# Patient Record
Sex: Male | Born: 1945
Health system: Southern US, Community
[De-identification: ages and names within clinical notes are randomized; demographics above are authoritative.]

## PROBLEM LIST (undated history)

## (undated) DIAGNOSIS — I10 Essential (primary) hypertension: Secondary | ICD-10-CM

## (undated) DIAGNOSIS — G4730 Sleep apnea, unspecified: Secondary | ICD-10-CM

## (undated) DIAGNOSIS — K250 Acute gastric ulcer with hemorrhage: Secondary | ICD-10-CM

## (undated) HISTORY — PX: KNEE ARTHROSCOPY: SUR90

## (undated) HISTORY — PX: COLONOSCOPY: SHX174

## (undated) HISTORY — DX: Essential (primary) hypertension: I10

---

## 1969-10-05 HISTORY — PX: OTHER SURGICAL HISTORY: SHX169

## 1982-10-05 HISTORY — PX: HAMMER TOE SURGERY: SHX385

## 2007-04-05 ENCOUNTER — Ambulatory Visit: Payer: Self-pay | Admitting: Internal Medicine

## 2007-05-06 ENCOUNTER — Ambulatory Visit: Payer: Self-pay | Admitting: Gastroenterology

## 2007-05-18 ENCOUNTER — Ambulatory Visit: Payer: BC Managed Care – PPO | Admitting: Gastroenterology

## 2007-05-18 ENCOUNTER — Encounter: Payer: Self-pay | Admitting: Gastroenterology

## 2008-08-20 ENCOUNTER — Encounter: Admission: RE | Admit: 2008-08-20 | Discharge: 2008-08-20 | Payer: Self-pay | Admitting: Family Medicine

## 2008-09-13 ENCOUNTER — Encounter: Admission: RE | Admit: 2008-09-13 | Discharge: 2008-09-13 | Payer: Self-pay | Admitting: Family Medicine

## 2010-02-20 ENCOUNTER — Encounter
Admission: RE | Admit: 2010-02-20 | Discharge: 2010-02-20 | Payer: Self-pay | Source: Ambulatory Visit | Admitting: Family Medicine

## 2011-02-17 NOTE — Assessment & Plan Note (Signed)
[facility]                             PULMONARY OFFICE NOTE   BRESLIN, HEMANN                         MRN:          [id number]  DATE:04/05/2007                            DOB:          1946-07-12    PROBLEM:  This 65 year old man referred through the courtesy of Dr.  Bruna Potter with the concern of snoring.   HISTORY OF PRESENT ILLNESS:  He denies daytime sleepiness and is not  aware of apnea's. He sleeps on his back comfortably without nasal  congestion. Typical bedtime is between 11 and midnight, estimated 5 to  10 minutes sleep latency. Sometimes, he will awake once. Sometimes, not  at all, after sleep onset, before getting up at 7 to 8 a.m. He has been  losing weight. He is alone with nobody complaining about his sleep  pattern.   MEDICATIONS:  Homeopathic medication for leg cramps, CO q.10.,  hyaluronic acid.   ALLERGIES:  NO KNOWN DRUG ALLERGIES.   REVIEW OF SYSTEMS:  He admits only to snoring and to a 15 pound weight  loss in the last 2 years. He denies cough or dyspnea, significant nasal  congestion, headache, confusion, chest pain, or palpitations.   PAST MEDICAL HISTORY:  Specifically, has had no ENT surgery. No history  of lung or cardiac disease.   PAST SURGICAL HISTORY:  Repair of hammer toe in both feet and  arthroscopy both knees.   SOCIAL HISTORY:  Smokes 1/2 pack per day. Drinks 12 beers per week. He  is divorced with children. Retired from [employer] and  moved here from [place] to be located closer to friends.   FAMILY HISTORY:  He offers little detail but thinks he is unaware of  anybody with sleep problems, heart or cerebrovascular problems. There  may be some with diabetes.   PHYSICAL EXAMINATION:  VITAL SIGNS:  Weight 234 pounds. Blood pressure  164/110. Pulse regular at 75. Room air saturation 100%.  GENERAL:  He is alert. Medium build.  HEENT:  Palate spacing 2/4. Voice quality normal. No stridor.  No  thyromegaly.  CHEST:  Trace end-expiratory wheeze, unlabored without dullness or  rhonchi.  HEART:  Sounds regular without murmur or gallop.  LYMPH:  No adenopathy found.  ABDOMEN:  No enlargement of liver or spleen.  EXTREMITIES:  No tremor, cyanosis, clubbing, or edema.   IMPRESSION:  1. Snoring without strong symptom pattern suggestion of sleep apnea.  2. Tobacco use.  3. Moderate alcohol use.  4. Hypertension.   PLAN:  We discussed the physiology medical concerns and general pattern  of obstructive sleep apnea. He indicated interest in getting himself a  tape recorder to record his own snoring for his own observation at home.  I gave him a booklet on sleep apnea. He was not interested in moving  forward with a sleep study at this time but understands it is available.  He will return p.r.n. and will followup with Dr. Clide Deutscher for his primary  medical care. I appreciate the chance to meet him.     Clinton D. Maple Hudson, MD,  FCCP, FACP  Electronically Signed    CDY/MedQ  DD: 04/10/2007  DT: 04/10/2007  Job #: [id number]   cc:   Jarrett Soho., M.D.

## 2012-04-26 ENCOUNTER — Other Ambulatory Visit: Payer: Self-pay | Admitting: Family Medicine

## 2012-04-26 ENCOUNTER — Ambulatory Visit
Admission: RE | Admit: 2012-04-26 | Discharge: 2012-04-26 | Disposition: A | Discharge status: Home / Self Care | Payer: BC Managed Care – PPO | Source: Ambulatory Visit | Attending: Family Medicine | Admitting: Family Medicine

## 2012-04-26 DIAGNOSIS — M25473 Effusion, unspecified ankle: Secondary | ICD-10-CM

## 2012-04-26 DIAGNOSIS — M25476 Effusion, unspecified foot: Secondary | ICD-10-CM

## 2012-05-05 ENCOUNTER — Encounter: Payer: Self-pay | Admitting: Gastroenterology

## 2012-05-24 ENCOUNTER — Encounter: Payer: Self-pay | Admitting: Gastroenterology

## 2012-06-22 ENCOUNTER — Ambulatory Visit (AMBULATORY_SURGERY_CENTER): Payer: BC Managed Care – PPO | Admitting: *Deleted

## 2012-06-22 ENCOUNTER — Encounter: Payer: Self-pay | Admitting: Gastroenterology

## 2012-06-22 VITALS — Ht 72.0 in | Wt 236.9 lb

## 2012-06-22 DIAGNOSIS — Z1211 Encounter for screening for malignant neoplasm of colon: Secondary | ICD-10-CM

## 2012-06-22 MED ORDER — NA SULFATE-K SULFATE-MG SULF 17.5-3.13-1.6 GM/177ML PO SOLN: ORAL | Status: DC

## 2012-07-06 ENCOUNTER — Ambulatory Visit (AMBULATORY_SURGERY_CENTER): Payer: BC Managed Care – PPO | Admitting: Gastroenterology

## 2012-07-06 ENCOUNTER — Encounter: Payer: Self-pay | Admitting: Gastroenterology

## 2012-07-06 VITALS — BP 144/82 | HR 66 | Temp 97.6°F | Resp 11 | Ht 72.0 in | Wt 236.0 lb

## 2012-07-06 DIAGNOSIS — Z1211 Encounter for screening for malignant neoplasm of colon: Secondary | ICD-10-CM

## 2012-07-06 DIAGNOSIS — D126 Benign neoplasm of colon, unspecified: Secondary | ICD-10-CM

## 2012-07-06 MED ORDER — SODIUM CHLORIDE 0.9 % IV SOLN: 500.0000 mL | INTRAVENOUS | Status: DC

## 2012-07-06 NOTE — Progress Notes (Signed)
Patient did not experience any of the following events: a burn prior to discharge; a fall within the facility; wrong site/side/patient/procedure/implant event; or a hospital transfer or hospital admission upon discharge from the facility. (G8907) Patient did not have preoperative order for IV antibiotic SSI prophylaxis. (G8918)  

## 2012-07-06 NOTE — Patient Instructions (Signed)
Colon polyp x 1 removed, diverticulosis and hemorrhoids seen. Try to follow high fiber diet. See handouts. Result letter in your mail in 2-3 weeks. Resume current medications. Hold aspirin, NSAIDS, and anti-inflammatory medications for 2 weeks. Call us with any questions or concerns. Thank you!!   YOU HAD AN ENDOSCOPIC PROCEDURE TODAY AT THE [facility]: Refer to the procedure report that was given to you for any specific questions about what was found during the examination.  If the procedure report does not answer your questions, please call your gastroenterologist to clarify.  If you requested that your care partner not be given the details of your procedure findings, then the procedure report has been included in a sealed envelope for you to review at your convenience later.  YOU SHOULD EXPECT: Some feelings of bloating in the abdomen. Passage of more gas than usual.  Walking can help get rid of the air that was put into your GI tract during the procedure and reduce the bloating. If you had a lower endoscopy (such as a colonoscopy or flexible sigmoidoscopy) you may notice spotting of blood in your stool or on the toilet paper. If you underwent a bowel prep for your procedure, then you may not have a normal bowel movement for a few days.  DIET: Your first meal following the procedure should be a light meal and then it is ok to progress to your normal diet.  A half-sandwich or bowl of soup is an example of a good first meal.  Heavy or fried foods are harder to digest and may make you feel nauseous or bloated.  Likewise meals heavy in dairy and vegetables can cause extra gas to form and this can also increase the bloating.  Drink plenty of fluids but you should avoid alcoholic beverages for 24 hours.  ACTIVITY: Your care partner should take you home directly after the procedure.  You should plan to take it easy, moving slowly for the rest of the day.  You can resume normal activity the day  after the procedure however you should NOT DRIVE or use heavy machinery for 24 hours (because of the sedation medicines used during the test).    SYMPTOMS TO REPORT IMMEDIATELY: A gastroenterologist can be reached at any hour.  During normal business hours, 8:30 AM to 5:00 PM Monday through Friday, call [phone].  After hours and on weekends, please call the GI answering service at [phone] who will take a message and have the physician on call contact you.   Following lower endoscopy (colonoscopy or flexible sigmoidoscopy):  Excessive amounts of blood in the stool  Significant tenderness or worsening of abdominal pains  Swelling of the abdomen that is new, acute  Fever of 100F or higher  FOLLOW UP: If any biopsies were taken you will be contacted by phone or by letter within the next 1-3 weeks.  Call your gastroenterologist if you have not heard about the biopsies in 3 weeks.  Our staff will call the home number listed on your records the next business day following your procedure to check on you and address any questions or concerns that you may have at that time regarding the information given to you following your procedure. This is a courtesy call and so if there is no answer at the home number and we have not heard from you through the emergency physician on call, we will assume that you have returned to your regular daily activities without incident.  SIGNATURES/CONFIDENTIALITY:  You and/or your care partner have signed paperwork which will be entered into your electronic medical record.  These signatures attest to the fact that that the information above on your After Visit Summary has been reviewed and is understood.  Full responsibility of the confidentiality of this discharge information lies with you and/or your care-partner.

## 2012-07-06 NOTE — Op Note (Signed)
[facility] [address] [place], [address]   COLONOSCOPY PROCEDURE REPORT  PATIENT: Edward, Wells  MR#: [id number] BIRTHDATE: 06-23-1946 , 66  yrs. old GENDER: Male ENDOSCOPIST: Louis Meckel, MD REFERRED XB:JYNWG Blount, M.D. PROCEDURE DATE:  07/06/2012 PROCEDURE:   Colonoscopy with snare polypectomy and Submucosal injection, any substance ASA CLASS:   Class II INDICATIONS:index polypectomy 2008. MEDICATIONS: MAC sedation, administered by CRNA and propofol (Diprivan) 200mg  IV  DESCRIPTION OF PROCEDURE:   After the risks benefits and alternatives of the procedure were thoroughly explained, informed consent was obtained.  A digital rectal exam revealed no abnormalities of the rectum.   The [facility] CF-[id number]  endoscope was introduced through the anus and advanced to the cecum, which was identified by both the appendix and ileocecal valve. No adverse events experienced.   The quality of the prep was Suprep excellent The instrument was then slowly withdrawn as the colon was fully examined.      COLON FINDINGS: A sessile polyp measuring 13 mm in size was found in the ascending colon.  Endoscopic mucosal resection was performed by injecting 4cc saline into the submucosa to raise the lesion and polypectomy was performed with snare cautery.  The polyp lifted well after submucosal injection   The resection was complete and the polyp tissue was completely retrieved.  There was no blood loss from polypectomy.   Moderate diverticulosis was noted in the ascending colon.   Mild diverticulosis was noted in the sigmoid colon.   Hemorrhoids were found.   The colon mucosa was otherwise normal.  Retroflexed views revealed no abnormalities. The time to cecum=3 minutes 20 seconds.  Withdrawal time=10 minutes 05 seconds. The scope was withdrawn and the procedure completed. COMPLICATIONS: There were no complications.  ENDOSCOPIC IMPRESSION: 1.   Sessile polyp  measuring 13 mm in size was found in the ascending colon; endoscopic mucosal resection was performed 2.   Moderate diverticulosis was noted in the ascending colon 3.   Mild diverticulosis was noted in the sigmoid colon 4.   Hemorrhoids 5.   The colon mucosa was otherwise normal  RECOMMENDATIONS: If the polyp(s) removed today are proven to be adenomatous (pre-cancerous) polyps, you will need a repeat colonoscopy in 5 years.  Otherwise you should continue to follow colorectal cancer screening guidelines for "routine risk" patients with colonoscopy in 10 years.  You will receive a letter within 1-2 weeks with the results of your biopsy as well as final recommendations.  Please call my office if you have not received a letter after 3 weeks.   eSigned:  Louis Meckel, MD 07/06/2012 11:14 AM   cc:   PATIENT NAME:  Edward, Wells MR#: [id number]

## 2012-07-07 ENCOUNTER — Telehealth: Payer: Self-pay | Admitting: *Deleted

## 2012-07-07 NOTE — Telephone Encounter (Signed)
  Follow up Call-  Call back number 07/06/2012  Post procedure Call Back phone  # [phone] answering machine may not work  Permission to leave phone message Yes     Patient questions:  Do you have a fever, pain , or abdominal swelling? no Pain Score  0 *  Have you tolerated food without any problems? yes  Have you been able to return to your normal activities? yes  Do you have any questions about your discharge instructions: Diet   no Medications  no Follow up visit  no  Do you have questions or concerns about your Care? no  Actions: * If pain score is 4 or above: No action needed, pain <4.

## 2012-07-11 ENCOUNTER — Encounter: Payer: Self-pay | Admitting: Gastroenterology

## 2014-07-30 ENCOUNTER — Inpatient Hospital Stay (HOSPITAL_COMMUNITY)
Admission: EM | Admit: 2014-07-30 | Discharge: 2014-08-01 | DRG: 378 | Disposition: A | Discharge status: Home / Self Care | Payer: Medicare Other | Attending: Internal Medicine | Admitting: Internal Medicine

## 2014-07-30 ENCOUNTER — Encounter (HOSPITAL_COMMUNITY): Payer: Self-pay | Admitting: Emergency Medicine

## 2014-07-30 DIAGNOSIS — K922 Gastrointestinal hemorrhage, unspecified: Secondary | ICD-10-CM

## 2014-07-30 DIAGNOSIS — D649 Anemia, unspecified: Secondary | ICD-10-CM | POA: Diagnosis present

## 2014-07-30 DIAGNOSIS — Z79899 Other long term (current) drug therapy: Secondary | ICD-10-CM | POA: Diagnosis not present

## 2014-07-30 DIAGNOSIS — K250 Acute gastric ulcer with hemorrhage: Principal | ICD-10-CM | POA: Diagnosis present

## 2014-07-30 DIAGNOSIS — F1099 Alcohol use, unspecified with unspecified alcohol-induced disorder: Secondary | ICD-10-CM | POA: Diagnosis present

## 2014-07-30 DIAGNOSIS — M1990 Unspecified osteoarthritis, unspecified site: Secondary | ICD-10-CM | POA: Diagnosis present

## 2014-07-30 DIAGNOSIS — I10 Essential (primary) hypertension: Secondary | ICD-10-CM | POA: Diagnosis present

## 2014-07-30 DIAGNOSIS — Z86010 Personal history of colonic polyps: Secondary | ICD-10-CM

## 2014-07-30 DIAGNOSIS — D696 Thrombocytopenia, unspecified: Secondary | ICD-10-CM | POA: Diagnosis not present

## 2014-07-30 DIAGNOSIS — D62 Acute posthemorrhagic anemia: Secondary | ICD-10-CM | POA: Diagnosis present

## 2014-07-30 DIAGNOSIS — Z87891 Personal history of nicotine dependence: Secondary | ICD-10-CM

## 2014-07-30 DIAGNOSIS — Z888 Allergy status to other drugs, medicaments and biological substances status: Secondary | ICD-10-CM | POA: Diagnosis not present

## 2014-07-30 DIAGNOSIS — D500 Iron deficiency anemia secondary to blood loss (chronic): Secondary | ICD-10-CM

## 2014-07-30 HISTORY — DX: Acute gastric ulcer with hemorrhage: K25.0

## 2014-07-30 LAB — CBC WITH DIFFERENTIAL/PLATELET
BASOS PCT: 0 % (ref 0–1)
Basophils Absolute: 0 10*3/uL (ref 0.0–0.1)
EOS PCT: 1 % (ref 0–5)
Eosinophils Absolute: 0.1 10*3/uL (ref 0.0–0.7)
HCT: 36.2 % — ABNORMAL LOW (ref 39.0–52.0)
Hemoglobin: 11.9 g/dL — ABNORMAL LOW (ref 13.0–17.0)
LYMPHS ABS: 1.8 10*3/uL (ref 0.7–4.0)
LYMPHS PCT: 23 % (ref 12–46)
MCH: 27.7 pg (ref 26.0–34.0)
MCHC: 32.9 g/dL (ref 30.0–36.0)
MCV: 84.2 fL (ref 78.0–100.0)
MONOS PCT: 6 % (ref 3–12)
Monocytes Absolute: 0.5 10*3/uL (ref 0.1–1.0)
NEUTROS ABS: 5.5 10*3/uL (ref 1.7–7.7)
Neutrophils Relative %: 70 % (ref 43–77)
PLATELETS: 154 10*3/uL (ref 150–400)
RBC: 4.3 MIL/uL (ref 4.22–5.81)
RDW: 13.7 % (ref 11.5–15.5)
WBC: 7.9 10*3/uL (ref 4.0–10.5)

## 2014-07-30 LAB — COMPREHENSIVE METABOLIC PANEL
ALBUMIN: 3.7 g/dL (ref 3.5–5.2)
ALT: 19 U/L (ref 0–53)
ANION GAP: 13 (ref 5–15)
AST: 14 U/L (ref 0–37)
Alkaline Phosphatase: 66 U/L (ref 39–117)
BILIRUBIN TOTAL: 0.3 mg/dL (ref 0.3–1.2)
BUN: 28 mg/dL — ABNORMAL HIGH (ref 6–23)
CO2: 23 meq/L (ref 19–32)
Calcium: 9.8 mg/dL (ref 8.4–10.5)
Chloride: 105 mEq/L (ref 96–112)
Creatinine, Ser: 0.96 mg/dL (ref 0.50–1.35)
GFR calc Af Amer: >90 mL/min (ref >90)
GFR, EST NON AFRICAN AMERICAN: 84 mL/min — AB (ref >90)
GLUCOSE: 150 mg/dL — AB (ref 70–99)
Potassium: 3.9 mEq/L (ref 3.7–5.3)
SODIUM: 141 meq/L (ref 137–147)
Total Protein: 6.7 g/dL (ref 6.0–8.3)

## 2014-07-30 LAB — CBC
HEMATOCRIT: 34.8 % — AB (ref 39.0–52.0)
HEMOGLOBIN: 11.6 g/dL — AB (ref 13.0–17.0)
MCH: 27.6 pg (ref 26.0–34.0)
MCHC: 33.3 g/dL (ref 30.0–36.0)
MCV: 82.7 fL (ref 78.0–100.0)
Platelets: 142 10*3/uL — ABNORMAL LOW (ref 150–400)
RBC: 4.21 MIL/uL — ABNORMAL LOW (ref 4.22–5.81)
RDW: 13.6 % (ref 11.5–15.5)
WBC: 7.8 10*3/uL (ref 4.0–10.5)

## 2014-07-30 LAB — I-STAT TROPONIN, ED: TROPONIN I, POC: 0.01 ng/mL (ref 0.00–0.08)

## 2014-07-30 LAB — PROTIME-INR
INR: 1.03 (ref 0.00–1.49)
PROTHROMBIN TIME: 13.7 s (ref 11.6–15.2)

## 2014-07-30 LAB — RETICULOCYTES
RBC.: 4.21 MIL/uL — AB (ref 4.22–5.81)
RETIC CT PCT: 1.8 % (ref 0.4–3.1)
Retic Count, Absolute: 75.8 10*3/uL (ref 19.0–186.0)

## 2014-07-30 LAB — LIPASE, BLOOD: Lipase: 27 U/L (ref 11–59)

## 2014-07-30 LAB — TYPE AND SCREEN
ABO/RH(D): B POS
Antibody Screen: NEGATIVE

## 2014-07-30 LAB — POC OCCULT BLOOD, ED: Fecal Occult Bld: POSITIVE — AB

## 2014-07-30 LAB — ABO/RH: ABO/RH(D): B POS

## 2014-07-30 MED ORDER — MORPHINE SULFATE 2 MG/ML IJ SOLN: 1.0000 mg | INTRAMUSCULAR | Status: DC | PRN

## 2014-07-30 MED ORDER — SODIUM CHLORIDE 0.9 % IJ SOLN
3.0000 mL | Freq: Two times a day (BID) | INTRAMUSCULAR | Status: DC
  Administered 2014-07-31 – 2014-08-01 (×3): 3 mL via INTRAVENOUS

## 2014-07-30 MED ORDER — ACETAMINOPHEN 650 MG RE SUPP: 650.0000 mg | Freq: Four times a day (QID) | RECTAL | Status: DC | PRN

## 2014-07-30 MED ORDER — SODIUM CHLORIDE 0.9 % IV SOLN
80.0000 mg | Freq: Once | INTRAVENOUS | Status: AC
  Administered 2014-07-30: 80 mg via INTRAVENOUS
  Filled 2014-07-30: qty 80

## 2014-07-30 MED ORDER — ONDANSETRON HCL 4 MG/2ML IJ SOLN: 4.0000 mg | Freq: Four times a day (QID) | INTRAMUSCULAR | Status: DC | PRN

## 2014-07-30 MED ORDER — ONDANSETRON HCL 4 MG PO TABS: 4.0000 mg | ORAL_TABLET | Freq: Four times a day (QID) | ORAL | Status: DC | PRN

## 2014-07-30 MED ORDER — THIAMINE HCL 100 MG/ML IJ SOLN
100.0000 mg | Freq: Every day | INTRAMUSCULAR | Status: DC
  Administered 2014-07-30 – 2014-08-01 (×3): 100 mg via INTRAVENOUS
  Filled 2014-07-30 (×3): qty 1

## 2014-07-30 MED ORDER — ALBUTEROL SULFATE (2.5 MG/3ML) 0.083% IN NEBU: 2.5000 mg | INHALATION_SOLUTION | RESPIRATORY_TRACT | Status: DC | PRN

## 2014-07-30 MED ORDER — ACETAMINOPHEN 325 MG PO TABS: 650.0000 mg | ORAL_TABLET | Freq: Four times a day (QID) | ORAL | Status: DC | PRN

## 2014-07-30 MED ORDER — SODIUM CHLORIDE 0.9 % IV BOLUS (SEPSIS)
1000.0000 mL | Freq: Once | INTRAVENOUS | Status: AC
  Administered 2014-07-30: 1000 mL via INTRAVENOUS

## 2014-07-30 MED ORDER — SODIUM CHLORIDE 0.9 % IV SOLN
8.0000 mg/h | INTRAVENOUS | Status: DC
  Administered 2014-07-30 – 2014-07-31 (×2): 8 mg/h via INTRAVENOUS
  Filled 2014-07-30 (×5): qty 80

## 2014-07-30 MED ORDER — HYDRALAZINE HCL 20 MG/ML IJ SOLN: 10.0000 mg | Freq: Four times a day (QID) | INTRAMUSCULAR | Status: DC | PRN

## 2014-07-30 MED ORDER — DEXTROSE-NACL 5-0.45 % IV SOLN
INTRAVENOUS | Status: DC
  Administered 2014-07-30 – 2014-08-01 (×3): via INTRAVENOUS

## 2014-07-30 NOTE — H&P (Signed)
[facility] History and Physical  Cortavius Montesinos OMB:[id number] DOB: 1945/11/17 DOA: 07/30/2014   PCP: Elizabeth Palau, MD  Specialists: Followed by [facility] gastro[facility]ology for screening colonoscopies  Chief Complaint: Vomited blood  HPI: Edward Wells is a 68 y.o. male with a past medical history of hypertension who was in his usual state of health until about 2 weeks ago when he started experiencing heartburns and gassy sensations in his stomach. He took over-the-counter Zantac and Gas-X to relieve his symptoms. He was also experiencing nausea and vomited every 3 days. Subsequently, the symptoms resolved and he felt better. However, yesterday patient felt nauseated and dizzy in the morning. He vomited blood. And then he passed out. He also had bowel movements which were dark but did not have any blood in them. He felt weak. He went to his bed and fell asleep. He woke up yesterday night and decided to eat some yogurt. Then this morning he went to see his primary care physician who asked him to come in to the emergency department. He denies any abdominal pain. Denies any symptoms such as early satiety. He does admit to losing about 14 pounds in the last few months but states that he has been trying to lose weight. He used to drink alcohol till about 1 month ago. He had about 6 shots of Vodka or brandy every week. Denies any wine or beer intake. He also quit smoking 1 month ago. Denies ever having had similar bleeding in the past. He is up-to-date on his colonoscopy and last one was done in 2013, which revealed adenomatous polyps.  Home Medications: Prior to Admission medications   Medication Sig Start Date End Date Taking? Authorizing Provider  Alpha-D-Galactosidase (GAS-X PREVENTION) CAPS Take 1 capsule by mouth daily as needed (for gas).   Yes Historical Provider, MD  amLODipine (NORVASC) 10 MG tablet Take 10 mg by mouth daily.   Yes Historical Provider, MD  cholecalciferol (VITAMIN D) 1000  UNITS tablet Take 1,000 Units by mouth daily.   Yes Historical Provider, MD  Coenzyme Q10 (CO Q 10) 100 MG CAPS Take 100 mg by mouth daily.   Yes Historical Provider, MD  OVER THE COUNTER MEDICATION Take 1 capsule by mouth daily. *over the counter beet root capsule*   Yes Historical Provider, MD  ranitidine (ZANTAC) 150 MG tablet Take 150 mg by mouth daily as needed for heartburn (and acid reflux).   Yes Historical Provider, MD    Allergies:  Allergies  Allergen Reactions  . Furosemide     Muscle cramps    Past Medical History: Past Medical History  Diagnosis Date  . Hypertension     Past Surgical History  Procedure Laterality Date  . Knee arthroscopy      left, 2004; right, 2001  . Hammer toe surgery  1984    bilateral  . Ganglion cyst wrist  1971    right    Social History: He lives alone in [place]. He is a retired [employer]. He quit smoking 1 month ago. He also quit drinking a month ago as described under history of present illness. Denies any recreational drug use. Independent with daily activities.  Family History:  Family History  Problem Relation Age of Onset  . Colon cancer Neg Hx   . Stomach cancer Neg Hx   . Hypertension Mother      Review of Systems - History obtained from the patient General ROS: positive for  - fatigue Psychological ROS:  negative Ophthalmic ROS: negative ENT ROS: negative Allergy and Immunology ROS: negative Hematological and Lymphatic ROS: negative Endocrine ROS: negative Respiratory ROS: no cough, shortness of breath, or wheezing Cardiovascular ROS: no chest pain or dyspnea on exertion Gastrointestinal ROS: as in hpi Genito-Urinary ROS: no dysuria, trouble voiding, or hematuria Musculoskeletal ROS: negative Neurological ROS: no TIA or stroke symptoms Dermatological ROS: negative  Physical Examination  Filed Vitals:   07/30/14 1530 07/30/14 1600 07/30/14 1630 07/30/14 1700  BP: 132/74 129/72  134/66 139/71  Pulse: 86 85 79 89  Temp:      Resp: '11 12 13 11  ' SpO2: 100% 100% 100% 100%    BP 139/71  Pulse 89  Temp(Src) 98.3 F (36.8 C)  Resp 11  SpO2 100%  General appearance: alert, cooperative, appears stated age, no distress and moderately obese Head: Normocephalic, without obvious abnormality, atraumatic Eyes: conjunctivae/corneas clear. PERRL, EOM's intact. Throat: lips, mucosa, and tongue normal; teeth and gums normal Neck: no adenopathy, no carotid bruit, no JVD, supple, symmetrical, trachea midline and thyroid not enlarged, symmetric, no tenderness/mass/nodules Resp: clear to auscultation bilaterally Cardio: regular rate and rhythm, S1, S2 normal, no murmur, click, rub or gallop GI: soft, non-tender; bowel sounds normal; no masses,  no organomegaly Extremities: extremities normal, atraumatic, no cyanosis or edema Pulses: 2+ and symmetric Skin: Skin color, texture, turgor normal. No rashes or lesions Lymph nodes: Cervical, supraclavicular, and axillary nodes normal. Neurologic: Alert and oriented 3. No focal neurological deficits noted.  Laboratory Data: Results for orders placed during the hospital encounter of 07/30/14 (from the past 48 hour(s))  CBC WITH DIFFERENTIAL     Status: Abnormal   Collection Time    07/30/14 12:20 PM      Result Value Ref Range   WBC 7.9  4.0 - 10.5 K/uL   RBC 4.30  4.22 - 5.81 MIL/uL   Hemoglobin 11.9 (*) 13.0 - 17.0 g/dL   HCT 36.2 (*) 39.0 - 52.0 %   MCV 84.2  78.0 - 100.0 fL   MCH 27.7  26.0 - 34.0 pg   MCHC 32.9  30.0 - 36.0 g/dL   RDW 13.7  11.5 - 15.5 %   Platelets 154  150 - 400 K/uL   Neutrophils Relative % 70  43 - 77 %   Neutro Abs 5.5  1.7 - 7.7 K/uL   Lymphocytes Relative 23  12 - 46 %   Lymphs Abs 1.8  0.7 - 4.0 K/uL   Monocytes Relative 6  3 - 12 %   Monocytes Absolute 0.5  0.1 - 1.0 K/uL   Eosinophils Relative 1  0 - 5 %   Eosinophils Absolute 0.1  0.0 - 0.7 K/uL   Basophils Relative 0  0 - 1 %   Basophils  Absolute 0.0  0.0 - 0.1 K/uL  COMPREHENSIVE METABOLIC PANEL     Status: Abnormal   Collection Time    07/30/14 12:20 PM      Result Value Ref Range   Sodium 141  137 - 147 mEq/L   Potassium 3.9  3.7 - 5.3 mEq/L   Chloride 105  96 - 112 mEq/L   CO2 23  19 - 32 mEq/L   Glucose, Bld 150 (*) 70 - 99 mg/dL   BUN 28 (*) 6 - 23 mg/dL   Creatinine, Ser 0.96  0.50 - 1.35 mg/dL   Calcium 9.8  8.4 - 10.5 mg/dL   Total Protein 6.7  6.0 - 8.3 g/dL  Albumin 3.7  3.5 - 5.2 g/dL   AST 14  0 - 37 U/L   ALT 19  0 - 53 U/L   Alkaline Phosphatase 66  39 - 117 U/L   Total Bilirubin 0.3  0.3 - 1.2 mg/dL   GFR calc non Af Amer 84 (*) >90 mL/min   GFR calc Af Amer >90  >90 mL/min   Comment: (NOTE)     The eGFR has been calculated using the CKD EPI equation.     This calculation has not been validated in all clinical situations.     eGFR's persistently <90 mL/min signify possible Chronic Kidney     Disease.   Anion gap 13  5 - 15  LIPASE, BLOOD     Status: None   Collection Time    07/30/14 12:20 PM      Result Value Ref Range   Lipase 27  11 - 59 U/L  I-STAT TROPOININ, ED     Status: None   Collection Time    07/30/14 12:30 PM      Result Value Ref Range   Troponin i, poc 0.01  0.00 - 0.08 ng/mL   Comment 3            Comment: Due to the release kinetics of cTnI,     a negative result within the first hours     of the onset of symptoms does not rule out     myocardial infarction with certainty.     If myocardial infarction is still suspected,     repeat the test at appropriate intervals.  TYPE AND SCREEN     Status: None   Collection Time    07/30/14  3:20 PM      Result Value Ref Range   ABO/RH(D) B POS     Antibody Screen NEG     Sample Expiration 08/02/2014    ABO/RH     Status: None   Collection Time    07/30/14  3:20 PM      Result Value Ref Range   ABO/RH(D) B POS    POC OCCULT BLOOD, ED     Status: Abnormal   Collection Time    07/30/14  3:38 PM      Result Value Ref Range     Fecal Occult Bld POSITIVE (*) NEGATIVE    Radiology Reports: No results found.  Electrocardiogram: Sinus tachycardia, currently 9 bpm. Normal axis. Possible QT prolongation. No concerning ST or T-wave changes. No Q waves.  Problem List  Principal Problem:   Upper GI bleed Active Problems:   Essential hypertension   Normocytic anemia due to blood loss   Assessment: This is a 68 year old African-American male who presents with symptoms suggestive of upper GI bleeding. He had hematemesis and possibly even melena. He had heme positive stools here in the ED. His hemoglobin is 11.9. He most likely has peptic ulcer disease. His LFTs and normal and even though he does admit to previous use of alcohol, Variceal Bleeding is less likely.  Plan: #1. Upper GI bleeding with hematemesis and melena: He'll be admitted to the hospital. He'll be initiated on PPI infusion. He'll be kept NPO. I have discussed with the gastroenterologist, Dr. Carlean Purl who will consult on this patient and possibly do endoscopy in the morning as the patient is stable currently. He hasn't had any further episodes of bleeding since yesterday.  #2. Mild normocytic anemia likely secondary to acute blood loss: Check hemoglobin every 6  hours 4. Transfuse if it drops significantly.  #3 history of essential hypertension: Blood pressure stable. He was tachycardic initially, but his heart rate has improved. Hold his amlodipine for now. Continue to monitor blood pressures.  #4 . Recent cessation of alcohol and tobacco: He was congratulated on the same. LFTs are normal. He'll be given thiamine.   DVT Prophylaxis: SCDs Code Status: Full code Family Communication: Discussed with the patient  Disposition Plan: Admit to telemetry   Further management decisions will depend on results of further testing and patient's response to treatment.   [facility]  [facility] Pager [phone]  If 7PM-7AM, please contact  night-coverage [email or web address] Password [email or web address]  07/30/2014, 5:14 PM

## 2014-07-30 NOTE — ED Provider Notes (Signed)
CSN: [id number]     Arrival date & time 07/30/14  1205 History   First MD Initiated Contact with Patient 07/30/14 1403     Chief Complaint  Patient presents with  . Abdominal Pain  . Nausea  . Emesis     (Consider location/radiation/quality/duration/timing/severity/associated sxs/prior Treatment) HPI Pt presents with c/o vomiting bright red blood mixed with coffee ground emesis.  He states this occurred yesterday. He states he had been having reflux symptoms over the past 2 weeks for which he was taking zantac.  Had a couple of episodes of emesis that were of clear fluid.  Yesterday after eating breakfast he vomited a large amount of coffee ground emesis with bright red blood.  Later had a dark black stool.  He felt very sleepy and feels he may have passed out.  As he had no further symptoms he stayed at home yesterday.  Today presented to the emergency department.  Was able to eat breakfast this morning wtihout further emesis.  Denies nsaid use.  Denies daily etoh.  Does not take anticoagulants.  No abdominal pain.  There are no other associated systemic symptoms, there are no other alleviating or modifying factors.   Past Medical History  Diagnosis Date  . Hypertension   . Acute gastric ulcer with hemorrhage 07/30/2014   Past Surgical History  Procedure Laterality Date  . Knee arthroscopy      left, 2004; right, 2001  . Hammer toe surgery  1984    bilateral  . Ganglion cyst wrist  1971    right, x 2.  . Colonoscopy     Family History  Problem Relation Age of Onset  . Colon cancer Neg Hx   . Stomach cancer Neg Hx   . Hypertension Mother    History  Substance Use Topics  . Smoking status: Former Smoker    Start date: 03/22/2012  . Smokeless tobacco: Never Used  . Alcohol Use: Yes     Comment: occasional    Review of Systems ROS reviewed and all otherwise negative except for mentioned in HPI    Allergies  Furosemide  Home Medications   Prior to Admission  medications   Medication Sig Start Date End Date Taking? Authorizing Provider  Alpha-D-Galactosidase (GAS-X PREVENTION) CAPS Take 1 capsule by mouth daily as needed (for gas).   Yes Historical Provider, MD  amLODipine (NORVASC) 10 MG tablet Take 10 mg by mouth daily.   Yes Historical Provider, MD  cholecalciferol (VITAMIN D) 1000 UNITS tablet Take 1,000 Units by mouth daily.   Yes Historical Provider, MD  Coenzyme Q10 (CO Q 10) 100 MG CAPS Take 100 mg by mouth daily.   Yes Historical Provider, MD  OVER THE COUNTER MEDICATION Take 1 capsule by mouth daily. *over the counter beet root capsule*   Yes Historical Provider, MD  ranitidine (ZANTAC) 150 MG tablet Take 150 mg by mouth daily as needed for heartburn (and acid reflux).   Yes Historical Provider, MD   BP 120/63  Pulse 73  Temp(Src) 98.2 F (36.8 C) (Oral)  Resp 16  Ht 6' (1.829 m)  Wt 230 lb 6.1 oz (104.5 kg)  BMI 31.24 kg/m2  SpO2 100% Vitals reviewed Physical Exam Physical Examination: General appearance - alert, well appearing, and in no distress Mental status - alert, oriented to person, place, and time Eyes - no conjunctival injection, no scleral icterus, no conjunctival pallor Mouth - mucous membranes moist, pharynx normal without lesions Chest - clear to auscultation, no  wheezes, rales or rhonchi, symmetric air entry Heart - normal rate, regular rhythm, normal S1, S2, no murmurs, rubs, clicks or gallops Abdomen - soft, nontender, nondistended, no masses or organomegaly, nabs Extremities - peripheral pulses normal, no pedal edema, no clubbing or cyanosis Skin - normal coloration and turgor, no rashes  ED Course  Procedures (including critical care time)  4:06 PM d/w GI, Dr. Oletta Lamas, they will consult on patient  4:48 PM d/w triad, pt to be admitted to telemetry bed, d/w Dr. [place] Pink.   CRITICAL CARE Performed by: Threasa Beards Total critical care time: 35 Critical care time was exclusive of separately billable  procedures and treating other patients. Critical care was necessary to treat or prevent imminent or life-threatening deterioration. Critical care was time spent personally by me on the following activities: development of treatment plan with patient and/or surrogate as well as nursing, discussions with consultants, evaluation of patient's response to treatment, examination of patient, obtaining history from patient or surrogate, ordering and performing treatments and interventions, ordering and review of laboratory studies, ordering and review of radiographic studies, pulse oximetry and re-evaluation of patient's condition.   Labs Review Labs Reviewed  CBC WITH DIFFERENTIAL - Abnormal; Notable for the following:    Hemoglobin 11.9 (*)    HCT 36.2 (*)    All other components within normal limits  COMPREHENSIVE METABOLIC PANEL - Abnormal; Notable for the following:    Glucose, Bld 150 (*)    BUN 28 (*)    GFR calc non Af Amer 84 (*)    All other components within normal limits  COMPREHENSIVE METABOLIC PANEL - Abnormal; Notable for the following:    Glucose, Bld 101 (*)    Total Protein 5.7 (*)    Albumin 3.2 (*)    GFR calc non Af Amer 86 (*)    All other components within normal limits  CBC - Abnormal; Notable for the following:    RBC 4.21 (*)    Hemoglobin 11.6 (*)    HCT 34.8 (*)    Platelets 142 (*)    All other components within normal limits  CBC - Abnormal; Notable for the following:    RBC 3.71 (*)    Hemoglobin 10.3 (*)    HCT 30.8 (*)    Platelets 127 (*)    All other components within normal limits  CBC - Abnormal; Notable for the following:    RBC 3.69 (*)    Hemoglobin 10.3 (*)    HCT 31.6 (*)    Platelets 131 (*)    All other components within normal limits  RETICULOCYTES - Abnormal; Notable for the following:    RBC. 4.21 (*)    All other components within normal limits  CBC - Abnormal; Notable for the following:    RBC 3.46 (*)    Hemoglobin 9.9 (*)    HCT  29.3 (*)    Platelets 134 (*)    All other components within normal limits  BASIC METABOLIC PANEL - Abnormal; Notable for the following:    Glucose, Bld 107 (*)    GFR calc non Af Amer 88 (*)    All other components within normal limits  POC OCCULT BLOOD, ED - Abnormal; Notable for the following:    Fecal Occult Bld POSITIVE (*)    All other components within normal limits  LIPASE, BLOOD  PROTIME-INR  VITAMIN B12  FOLATE  IRON AND TIBC  FERRITIN  I-STAT TROPOININ, ED  TYPE AND SCREEN  ABO/RH  Imaging Review No results found.   EKG Interpretation   Date/Time:  Monday July 30 2014 12:15:19 EDT Ventricular Rate:  129 PR Interval:  172 QRS Duration: 80 QT Interval:  390 QTC Calculation: 571 R Axis:   34 Text Interpretation:  Sinus tachycardia Otherwise normal ECG No old  tracing to compare Confirmed by [facility]  MD, Stanley [phone]) on 07/30/2014  2:04:46 PM      MDM   Final diagnoses:  Upper GI bleed  Normocytic anemia due to blood loss  Essential hypertension  Acute gastric ulcer with hemorrhage    Pt presenting with vomiting blood and coffee grounds and melena.  Pt with mild anemia but stable vital signs.  Pt started on protonix drip, consulted GI and admitted to triad for further evaluation.      Threasa Beards, MD 08/01/14 [phone]

## 2014-07-30 NOTE — ED Notes (Signed)
Per pt sts yesterday N,V and vomiting blood. sts he passed out and not sure how long he was out. sts he has been having issues with acid reflux recently.

## 2014-07-31 ENCOUNTER — Encounter (HOSPITAL_COMMUNITY): Payer: Self-pay | Admitting: Physician Assistant

## 2014-07-31 ENCOUNTER — Encounter (HOSPITAL_COMMUNITY)
Admission: EM | Disposition: A | Discharge status: Home / Self Care | Payer: Self-pay | Source: Home / Self Care | Attending: Internal Medicine

## 2014-07-31 HISTORY — PX: ESOPHAGOGASTRODUODENOSCOPY: SHX5428

## 2014-07-31 LAB — COMPREHENSIVE METABOLIC PANEL
ALBUMIN: 3.2 g/dL — AB (ref 3.5–5.2)
ALK PHOS: 54 U/L (ref 39–117)
ALT: 15 U/L (ref 0–53)
AST: 10 U/L (ref 0–37)
Anion gap: 10 (ref 5–15)
BILIRUBIN TOTAL: 0.4 mg/dL (ref 0.3–1.2)
BUN: 19 mg/dL (ref 6–23)
CO2: 24 mEq/L (ref 19–32)
CREATININE: 0.91 mg/dL (ref 0.50–1.35)
Calcium: 8.7 mg/dL (ref 8.4–10.5)
Chloride: 109 mEq/L (ref 96–112)
GFR calc Af Amer: >90 mL/min (ref >90)
GFR calc non Af Amer: 86 mL/min — ABNORMAL LOW (ref >90)
Glucose, Bld: 101 mg/dL — ABNORMAL HIGH (ref 70–99)
POTASSIUM: 3.8 meq/L (ref 3.7–5.3)
Sodium: 143 mEq/L (ref 137–147)
TOTAL PROTEIN: 5.7 g/dL — AB (ref 6.0–8.3)

## 2014-07-31 LAB — CBC
HCT: 30.8 % — ABNORMAL LOW (ref 39.0–52.0)
HEMATOCRIT: 31.6 % — AB (ref 39.0–52.0)
Hemoglobin: 10.3 g/dL — ABNORMAL LOW (ref 13.0–17.0)
Hemoglobin: 10.3 g/dL — ABNORMAL LOW (ref 13.0–17.0)
MCH: 27.8 pg (ref 26.0–34.0)
MCH: 27.9 pg (ref 26.0–34.0)
MCHC: 33.4 g/dL (ref 30.0–36.0)
MCHC: 32.6 g/dL (ref 30.0–36.0)
MCV: 83 fL (ref 78.0–100.0)
MCV: 85.6 fL (ref 78.0–100.0)
PLATELETS: 131 10*3/uL — AB (ref 150–400)
Platelets: 127 10*3/uL — ABNORMAL LOW (ref 150–400)
RBC: 3.71 MIL/uL — ABNORMAL LOW (ref 4.22–5.81)
RBC: 3.69 MIL/uL — ABNORMAL LOW (ref 4.22–5.81)
RDW: 13.6 % (ref 11.5–15.5)
RDW: 13.8 % (ref 11.5–15.5)
WBC: 7.5 10*3/uL (ref 4.0–10.5)
WBC: 6.7 10*3/uL (ref 4.0–10.5)

## 2014-07-31 LAB — VITAMIN B12: Vitamin B-12: 514 pg/mL (ref 211–911)

## 2014-07-31 LAB — IRON AND TIBC
Iron: 78 ug/dL (ref 42–135)
Saturation Ratios: 28 % (ref 20–55)
TIBC: 277 ug/dL (ref 215–435)
UIBC: 199 ug/dL (ref 125–400)

## 2014-07-31 LAB — FOLATE: Folate: >20 ng/mL

## 2014-07-31 LAB — FERRITIN: FERRITIN: 173 ng/mL (ref 22–322)

## 2014-07-31 SURGERY — EGD (ESOPHAGOGASTRODUODENOSCOPY)
Anesthesia: Moderate Sedation

## 2014-07-31 MED ORDER — MIDAZOLAM HCL 5 MG/ML IJ SOLN
INTRAMUSCULAR | Status: AC
  Filled 2014-07-31: qty 2

## 2014-07-31 MED ORDER — DIPHENHYDRAMINE HCL 50 MG/ML IJ SOLN
INTRAMUSCULAR | Status: AC
  Filled 2014-07-31: qty 1

## 2014-07-31 MED ORDER — PANTOPRAZOLE SODIUM 40 MG IV SOLR
40.0000 mg | Freq: Two times a day (BID) | INTRAVENOUS | Status: DC
  Filled 2014-07-31: qty 40

## 2014-07-31 MED ORDER — FENTANYL CITRATE 0.05 MG/ML IJ SOLN
INTRAMUSCULAR | Status: DC | PRN
  Administered 2014-07-31 (×2): 25 ug via INTRAVENOUS

## 2014-07-31 MED ORDER — PANTOPRAZOLE SODIUM 40 MG PO TBEC
40.0000 mg | DELAYED_RELEASE_TABLET | Freq: Every day | ORAL | Status: DC
  Administered 2014-08-01: 40 mg via ORAL
  Filled 2014-07-31: qty 1

## 2014-07-31 MED ORDER — MIDAZOLAM HCL 10 MG/2ML IJ SOLN
INTRAMUSCULAR | Status: DC | PRN
  Administered 2014-07-31 (×2): 2 mg via INTRAVENOUS

## 2014-07-31 MED ORDER — FENTANYL CITRATE 0.05 MG/ML IJ SOLN
INTRAMUSCULAR | Status: AC
  Filled 2014-07-31: qty 2

## 2014-07-31 MED ORDER — BUTAMBEN-TETRACAINE-BENZOCAINE 2-2-14 % EX AERO
INHALATION_SPRAY | CUTANEOUS | Status: DC | PRN
  Administered 2014-07-31: 2 via TOPICAL

## 2014-07-31 NOTE — Progress Notes (Deleted)
[facility] Consult: 12:08 PM 07/31/2014  LOS: 1 day    Referring Provider: Dr Waldron Labs  Primary Care Physician:  Elizabeth Palau, MD Primary Gastroenterologist:  Dr. Deatra Ina.     Reason for Consultation:  Vomited dark, maroon blood. Marland Kitchen    HPI: Edward Wells is a 69 y.o. male.  PMH htn and colon polyps. DJD but no significant pain issues.   3 weeks ago developed heartburn, bloating and vomitted clear material  3 x in one day.  Started taking OTC Zantac and gas-ex.  sxs resolved, was eating well and no recurrent dyspepsia.  On Sunday 10/15 around noon developed emesis, this time vomited dark, maroon material and had melenic stool.  Passed out, felt sleepy and went to bed.  Ate light breakfast on Monday.   Sxs never recurred and since he felt well, he waited to see his PMD on Monday.  BP was elevated at office per pt report.   PMD sent him to hospital for eval and pt admitted.    Hgb was 11.6 yesterday, 10.3 today.  MCV normal. Platelets are 127 - 143.  Coags, LFTs and BUN normal.  Has not had recurrent sxs since Sunday's episode.  He did cause some lacerations to bridge of his nose but is having no headache or visual changes.   14 # intentional weight loss in last few months.   No NSAIDs.  No dysphagia.  No abdominal pain.  No anorexia.  Stopped smoking and drinking vodka about one month ago.  Would drink 750 ml bottle of vodka over course of 2 weeks. No history of problems with liver.  No swelling in abdomen or legs.       Past Medical History  Diagnosis Date  . Hypertension     Past Surgical History  Procedure Laterality Date  . Knee arthroscopy      left, 2004; right, 2001  . Hammer toe surgery  1984    bilateral  . Ganglion cyst wrist  1971    right, x 2.    Prior to Admission medications     Medication Sig Start Date End Date Taking? Authorizing Provider  Alpha-D-Galactosidase (GAS-X PREVENTION) CAPS Take 1 capsule by mouth daily as needed (for gas).   Yes Historical Provider, MD  amLODipine (NORVASC) 10 MG tablet Take 10 mg by mouth daily.   Yes Historical Provider, MD  cholecalciferol (VITAMIN D) 1000 UNITS tablet Take 1,000 Units by mouth daily.   Yes Historical Provider, MD  Coenzyme Q10 (CO Q 10) 100 MG CAPS Take 100 mg by mouth daily.   Yes Historical Provider, MD  OVER THE COUNTER MEDICATION Take 1 capsule by mouth daily. *over the counter beet root capsule*   Yes Historical Provider, MD  ranitidine (ZANTAC) 150 MG tablet Take 150 mg by mouth daily as needed for heartburn (and acid reflux).   Yes Historical Provider, MD    Scheduled Meds: . sodium chloride  3 mL Intravenous Q12H  . thiamine  100 mg Intravenous Daily   Infusions: . dextrose 5 % and 0.45%  NaCl 75 mL/hr at 07/31/14 0904  . pantoprozole (PROTONIX) infusion 8 mg/hr (07/31/14 0323)   PRN Meds: acetaminophen, acetaminophen, albuterol, hydrALAZINE, morphine injection, ondansetron (ZOFRAN) IV, ondansetron   Allergies as of 07/30/2014 - Review Complete 07/30/2014  Allergen Reaction Noted  . Furosemide  07/30/2014    Family History  Problem Relation Age of Onset  . Colon cancer Neg Hx   . Stomach cancer Neg Hx   . Hypertension Mother     History   Social History  . Marital Status: Divorced    Spouse Name: N/A    Number of Children: N/A  . Years of Education: N/A    Social History Main Topics  . Smoking status: Former Smoker    Start date: 03/22/2012  . Smokeless tobacco: Never Used  . Alcohol Use: Yes     Comment: occasional  . Drug Use: No  . Sexual Activity: Not on file    Social History Narrative   Retired Set designer.     Moved to [place] ~ 2007.       Would drink 750 ml vodka over the course of 2 weeks.  Quit etoh and cigs September 2015.     REVIEW OF  SYSTEMS: Constitutional:  Generally feels strong and not unwell.  ENT:  No nose bleeds Pulm:  No SOB, no DOE.  No cough CV:  No palpitations, no LE edema.  GU:  No hematuria, no frequency GI:  Per HPI.  No dysphagia.  Heme:  No hx anemia, no unusual bleeding or bruising until 10/25   Transfusions:  None ever Neuro:  No headaches, no peripheral tingling or numbness Derm:  No itching, no rash or sores.  Endocrine:  No sweats or chills.  No polyuria or dysuria Immunization:  Not queried.  Travel:  None beyond local counties in last few months.    PHYSICAL EXAM: Vital signs in last 24 hours: Filed Vitals:   07/31/14 1000  BP: 132/76  Pulse: 90  Temp: 97.6 F (36.4 C)  Resp: 18   Wt Readings from Last 3 Encounters:  07/30/14 229 lb 8 oz (104.1 kg)  07/06/12 236 lb (107.049 kg)  06/22/12 236 lb 14.4 oz (107.457 kg)   General: pleasant, comfortable well appearing AAM.  Robust.  Head:  No swelling or asymmetry  Eyes:  No icterus or pallor Ears:  Not HOH  Nose:  No congestion or discharge Mouth:  Clear, moist Neck:  No JVD, no mass, no bruits, no TMG Lungs:  Clear bil.  No dyspnea or cough. Slightly hoarse vocal quality.  Heart: RRR.  No MRG Abdomen:  Soft, active BS, NT, ND.  Slightly obese.   Rectal: deferred   Musc/Skeltl: no joint swelling or contracture Extremities:  No CCE  Neurologic:  Oriented x 3.  No tremor, no limb weakness. Good historian.  Skin:  No rash, no sores Tattoos:  None seen Nodes:  No cervical adenopathy.    Psych:  Pleasant, in good spirits, relaxed, engaged.   Intake/Output from previous day: 10/26 0701 - 10/27 0700 In: -  Out: 425 [Urine:425] Intake/Output this shift: Total I/O In: 240 [P.O.:240] Out: -   LAB RESULTS:  Recent Labs  07/30/14 2033 07/31/14 0121 07/31/14 0747  WBC 7.8 7.5 6.7  HGB 11.6* 10.3* 10.3*  HCT 34.8* 30.8* 31.6*  PLT 142* 127* 131*   BMET Lab Results  Component Value Date   NA 143 07/31/2014   NA 141  07/30/2014   K  3.8 07/31/2014   K 3.9 07/30/2014   CL 109 07/31/2014   CL 105 07/30/2014   CO2 24 07/31/2014   CO2 23 07/30/2014   GLUCOSE 101* 07/31/2014   GLUCOSE 150* 07/30/2014   BUN 19 07/31/2014   BUN 28* 07/30/2014   CREATININE 0.91 07/31/2014   CREATININE 0.96 07/30/2014   CALCIUM 8.7 07/31/2014   CALCIUM 9.8 07/30/2014   LFT  Recent Labs  07/30/14 1220 07/31/14 0121  PROT 6.7 5.7*  ALBUMIN 3.7 3.2*  AST 14 10  ALT 19 15  ALKPHOS 66 54  BILITOT 0.3 0.4   PT/INR Lab Results  Component Value Date   INR 1.03 07/30/2014   Lipase     Component Value Date/Time   LIPASE 27 07/30/2014 1220    Ref. Range 07/30/2014 20:33  Iron Latest Range: 42-135 ug/dL 78  UIBC Latest Range: 125-400 ug/dL 199  TIBC Latest Range: 215-435 ug/dL 277  Saturation Ratios Latest Range: 20-55 % 28  Ferritin Latest Range: 22-322 ng/mL 173  Folate No range found >20.0    Drugs of Abuse  No results found for this basename: labopia,  cocainscrnur,  labbenz,  amphetmu,  thcu,  labbarb     RADIOLOGY STUDIES: No results found.  ENDOSCOPIC STUDIES: 07/2012 Colonoscopy, surveillance Sessile polyp lifted and removed at ascending colon (tubular adenoma), diverticulosis, hemorrhoids.   2008 colonoscopy, screening study. moderate pan diverticulosis.  Ascending: 5 mm and 15 mm polyps adenomatous) removed, rectum: 2 mm polyp (hyperplastic)removed.   No prior EGD.   IMPRESSION:   *  GI bleed. Rule out ulcer, gastritis, MW tear, neoplasia.  *  ABL anemia.  MCV normal. Iron studies normal.  *  Thrombocytopenia.  *  Hx adenomatous colon polyps.  Colonoscopy up to date.     PLAN:     *  Needs EGD.  Timing to be determined.  Not convinced he needs PPI drip.  Will change to BID.  *  CBC in AM.      Azucena Freed  07/31/2014, 12:08 PM Pager: [place] Attending  I have also seen and assessed the patient and agree with the above note. The risks and benefits as  well as alternatives of endoscopic procedure(s) have been discussed and reviewed. All questions answered. The patient agrees to proceed.  Gatha Mayer, MD, [facility] [phone] (pager) 07/31/2014 3:56 PM

## 2014-07-31 NOTE — Progress Notes (Signed)
Patient Demographics  Edward Wells, is a 68 y.o. male, DOB - [birth year], FTD:[id number]  Admit date - 07/30/2014   Admitting Physician Bonnielee Haff, MD  Outpatient Primary MD for the patient is Elizabeth Palau, MD  LOS - 1   Chief Complaint  Patient presents with  . Abdominal Pain  . Nausea  . Emesis        Subjective:   Edward Wells today has, No headache, No chest pain, No abdominal pain - No Nausea, No new weakness tingling or numbness, No Cough - SOB.   Assessment & Plan    Principal Problem:   Upper GI bleed Active Problems:   Essential hypertension   Normocytic anemia due to blood loss  Coffee-ground emesis and melena -Monitor Hemoglobin closely, mild drop in hemoglobin from 11.9 to 10.3. -Continue with IV Protonix -GI consult appreciated,Rule out ulcer, gastritis, MW tear, neoplasia.   Hypertension -Blood pressure acceptable, to neutral hold meds till he is more stable.  Code Status: Full   Family Communication: Patient is alert and oriented  Disposition Plan: For EGD   Procedures  None   Consults   Gastroenterology   Medications  Scheduled Meds: . pantoprazole (PROTONIX) IV  40 mg Intravenous Q12H  . sodium chloride  3 mL Intravenous Q12H  . thiamine  100 mg Intravenous Daily   Continuous Infusions: . dextrose 5 % and 0.45% NaCl 75 mL/hr at 07/31/14 0904   PRN Meds:.acetaminophen, acetaminophen, albuterol, hydrALAZINE, morphine injection, ondansetron (ZOFRAN) IV, ondansetron  DVT Prophylaxis   SCDs   Lab Results  Component Value Date   PLT 131* 07/31/2014    Antibiotics    Anti-infectives   None          Objective:   Filed Vitals:   07/30/14 1919 07/30/14 1936 07/31/14 0500 07/31/14 1000  BP: 150/88  125/76 132/76  Pulse: 92  87 90  Temp: 98.2 F (36.8 C)  98.2 F (36.8 C) 97.6 F (36.4 C)    TempSrc: Oral  Oral Oral  Resp: 16  18 18   Height:  6' (1.829 m)    Weight:  104.1 kg (229 lb 8 oz)    SpO2: 100%  100% 100%    Wt Readings from Last 3 Encounters:  07/30/14 104.1 kg (229 lb 8 oz)  07/30/14 104.1 kg (229 lb 8 oz)  07/06/12 107.049 kg (236 lb)     Intake/Output Summary (Last 24 hours) at 07/31/14 1500 Last data filed at 07/31/14 0900  Gross per 24 hour  Intake    240 ml  Output    425 ml  Net   -185 ml     Physical Exam  Awake Alert, Oriented X 3, No new F.N deficits, Normal affect [place].AT,PERRAL Supple Neck,No JVD, No cervical lymphadenopathy appriciated.  Symmetrical Chest wall movement, Good air movement bilaterally, CTAB RRR,No Gallops,Rubs or new Murmurs, No Parasternal Heave +ve B.Sounds, Abd Soft, No tenderness, No organomegaly appriciated, No rebound - guarding or rigidity. No Cyanosis, Clubbing or edema, No new Rash or bruise     Data Review   Micro Results No results found for this or any previous visit (from the past 240 hour(s)).  Radiology Reports No results found.  CBC  Recent Labs Lab  07/30/14 1220 07/30/14 2033 07/31/14 0121 07/31/14 0747  WBC 7.9 7.8 7.5 6.7  HGB 11.9* 11.6* 10.3* 10.3*  HCT 36.2* 34.8* 30.8* 31.6*  PLT 154 142* 127* 131*  MCV 84.2 82.7 83.0 85.6  MCH 27.7 27.6 27.8 27.9  MCHC 32.9 33.3 33.4 32.6  RDW 13.7 13.6 13.6 13.8  LYMPHSABS 1.8  --   --   --   MONOABS 0.5  --   --   --   EOSABS 0.1  --   --   --   BASOSABS 0.0  --   --   --     Chemistries   Recent Labs Lab 07/30/14 1220 07/31/14 0121  NA 141 143  K 3.9 3.8  CL 105 109  CO2 23 24  GLUCOSE 150* 101*  BUN 28* 19  CREATININE 0.96 0.91  CALCIUM 9.8 8.7  AST 14 10  ALT 19 15  ALKPHOS 66 54  BILITOT 0.3 0.4   ------------------------------------------------------------------------------------------------------------------ estimated creatinine clearance is 99.6 ml/min (by C-G formula based on Cr of  0.91). ------------------------------------------------------------------------------------------------------------------ No results found for this basename: HGBA1C,  in the last 72 hours ------------------------------------------------------------------------------------------------------------------ No results found for this basename: CHOL, HDL, LDLCALC, TRIG, CHOLHDL, LDLDIRECT,  in the last 72 hours ------------------------------------------------------------------------------------------------------------------ No results found for this basename: TSH, T4TOTAL, FREET3, T3FREE, THYROIDAB,  in the last 72 hours ------------------------------------------------------------------------------------------------------------------  Recent Labs  07/30/14 2033  VITAMINB12 514  FOLATE >20.0  FERRITIN 173  TIBC 277  IRON 78  RETICCTPCT 1.8    Coagulation profile  Recent Labs Lab 07/30/14 2033  INR 1.03    No results found for this basename: DDIMER,  in the last 72 hours  Cardiac Enzymes No results found for this basename: CK, CKMB, TROPONINI, MYOGLOBIN,  in the last 168 hours ------------------------------------------------------------------------------------------------------------------ No components found with this basename: POCBNP,      Time Spent in minutes   25 minutes   Edward Wells M.D on 07/31/2014 at 3:00 PM  Between 7am to 7pm - Pager - [phone]  After 7pm go to [email or web address] - password [email or web address]  And look for the night coverage person covering for me after hours  [facility] Office  [phone]   **Disclaimer: This note may have been dictated with voice recognition software. Similar sounding words can inadvertently be transcribed and this note may contain transcription errors which may not have been corrected upon publication of note.**

## 2014-07-31 NOTE — Op Note (Signed)
[facility] [place], [id number]   ENDOSCOPY PROCEDURE REPORT  PATIENT: Edward, Wells  MR#: [id number] BIRTHDATE: 29-Feb-1946 , 9  yrs. old GENDER: male ENDOSCOPIST: Gatha Mayer, MD, [facility] PROCEDURE DATE:  07/31/2014 PROCEDURE:  EGD w/ biopsy ASA CLASS:     Class II INDICATIONS:  melena and hematemesis. MEDICATIONS: Fentanyl 50 mcg IV and Versed 6 mg IV TOPICAL ANESTHETIC: Cetacaine Spray  DESCRIPTION OF PROCEDURE: After the risks benefits and alternatives of the procedure were thoroughly explained, informed consent was obtained.  The Pentax Gastroscope [id number] endoscope was introduced through the mouth and advanced to the second portion of the duodenum , Without limitations.  The instrument was slowly withdrawn as the mucosa was fully examined.    STOMACH: A single non-bleeding, clean-based and round ulcer measuring 15 x 59mm in size was found on the lesser curvature of the gastric body.  Biopsies were taken at edge of the ulcer. Otherwise normal EGD  Retroflexed views revealed as previously described.     The scope was then withdrawn from the patient and the procedure completed.  COMPLICATIONS: There were no immediate complications.  ENDOSCOPIC IMPRESSION: 1.   Single ulcer measuring 15 x 69mm in size was found on the lesser curvature of the gastric body; biopsies were taken NI 2.   Otherwise normal EGD  RECOMMENDATIONS: 1.  PPI qam, avoid NSAIDS. ASA for now (did not get a hx that was using these) 2.  Feed Home tomorrow if ok - I will f/u bxs and arrange f/u with primary GI MD Dr.  Deatra Ina    eSigned:  Gatha Mayer, MD, [facility] 07/31/2014 4:18 PM

## 2014-08-01 DIAGNOSIS — K250 Acute gastric ulcer with hemorrhage: Principal | ICD-10-CM

## 2014-08-01 LAB — CBC
HEMATOCRIT: 29.3 % — AB (ref 39.0–52.0)
Hemoglobin: 9.9 g/dL — ABNORMAL LOW (ref 13.0–17.0)
MCH: 28.6 pg (ref 26.0–34.0)
MCHC: 33.8 g/dL (ref 30.0–36.0)
MCV: 84.7 fL (ref 78.0–100.0)
Platelets: 134 10*3/uL — ABNORMAL LOW (ref 150–400)
RBC: 3.46 MIL/uL — ABNORMAL LOW (ref 4.22–5.81)
RDW: 13.6 % (ref 11.5–15.5)
WBC: 5.8 10*3/uL (ref 4.0–10.5)

## 2014-08-01 LAB — BASIC METABOLIC PANEL
ANION GAP: 9 (ref 5–15)
BUN: 11 mg/dL (ref 6–23)
CO2: 26 mEq/L (ref 19–32)
CREATININE: 0.86 mg/dL (ref 0.50–1.35)
Calcium: 9.1 mg/dL (ref 8.4–10.5)
Chloride: 108 mEq/L (ref 96–112)
GFR calc Af Amer: >90 mL/min (ref >90)
GFR, EST NON AFRICAN AMERICAN: 88 mL/min — AB (ref >90)
Glucose, Bld: 107 mg/dL — ABNORMAL HIGH (ref 70–99)
POTASSIUM: 3.9 meq/L (ref 3.7–5.3)
Sodium: 143 mEq/L (ref 137–147)

## 2014-08-01 MED ORDER — FERROUS SULFATE 325 (65 FE) MG PO TABS: 325.0000 mg | ORAL_TABLET | Freq: Two times a day (BID) | ORAL | Status: DC

## 2014-08-01 MED ORDER — PANTOPRAZOLE SODIUM 40 MG PO TBEC: 40.0000 mg | DELAYED_RELEASE_TABLET | Freq: Every day | ORAL | Status: DC

## 2014-08-01 NOTE — Progress Notes (Signed)
Daily Rounding Note  08/01/2014, 10:50 AM  LOS: 2 days   SUBJECTIVE:       Feels great.  Tolerating diet. No BMs since PTA.   OBJECTIVE:         Vital signs in last 24 hours:    Temp:  [98 F (36.7 C)-98.8 F (37.1 C)] 98.2 F (36.8 C) (10/28 0500) Pulse Rate:  [73-92] 73 (10/28 0500) Resp:  [10-19] 16 (10/28 0500) BP: (107-161)/(61-92) 120/63 mmHg (10/28 0500) SpO2:  [98 %-100 %] 98 % (10/28 0934) Weight:  [230 lb 6.1 oz (104.5 kg)] 230 lb 6.1 oz (104.5 kg) (10/27 2100) Last BM Date: 07/30/14 Filed Weights   07/30/14 1936 07/31/14 2100  Weight: 229 lb 8 oz (104.1 kg) 230 lb 6.1 oz (104.5 kg)   General: looks well and comfortable.    Heart: RRR Chest: clear bil Abdomen: soft, NT, ND.  Active BS  Extremities: no CCE Neuro/Psych:  Pleasant, cooperative, fully alert.  No confusion.   Intake/Output from previous day: 10/27 0701 - 10/28 0700 In: 1340 [P.O.:540; I.V.:800] Out: 1500 [Urine:1500]  Intake/Output this shift:    Lab Results:  Recent Labs  07/31/14 0121 07/31/14 0747 08/01/14 0626  WBC 7.5 6.7 5.8  HGB 10.3* 10.3* 9.9*  HCT 30.8* 31.6* 29.3*  PLT 127* 131* 134*   BMET  Recent Labs  07/30/14 1220 07/31/14 0121 08/01/14 0626  NA 141 143 143  K 3.9 3.8 3.9  CL 105 109 108  CO2 23 24 26   GLUCOSE 150* 101* 107*  BUN 28* 19 11  CREATININE 0.96 0.91 0.86  CALCIUM 9.8 8.7 9.1   LFT  Recent Labs  07/30/14 1220 07/31/14 0121  PROT 6.7 5.7*  ALBUMIN 3.7 3.2*  AST 14 10  ALT 19 15  ALKPHOS 66 54  BILITOT 0.3 0.4   Iron/TIBC/Ferritin/ %Sat    Component Value Date/Time   IRON 78 07/30/2014 2033   TIBC 277 07/30/2014 2033   FERRITIN 173 07/30/2014 2033   IRONPCTSAT 28 07/30/2014 2033     Scheduled Meds: . pantoprazole  40 mg Oral QAC breakfast  . sodium chloride  3 mL Intravenous Q12H  . thiamine  100 mg Intravenous Daily   Continuous Infusions: . dextrose 5 % and 0.45%  NaCl 75 mL/hr at 08/01/14 0933   PRN Meds:.acetaminophen, acetaminophen, albuterol, hydrALAZINE, morphine injection, ondansetron (ZOFRAN) IV, ondansetron   ASSESMENT:   * GI bleed.  10/27 EGD: solitary non-bleeding gastric ulcer.  * ABL anemia. MCV normal. Iron studies normal.  * Thrombocytopenia.  * Hx adenomatous colon polyps. Colonoscopy up to date.      PLAN   *  Daily po PPI, Omeprazole 40 or any other full dose PPI is sufficient.  Does not need po Iron or Zantac as outlined in discharge summary.  I apprised pt of need to take PPI once daily, on empty stomach and ignore the Zantac and Iron.  Pt also made aware of small potential for solitary gastric ulcers to be malignant and importance of GI follow up.  Advised him we would contact him or address at GI ROV if H pylori is present.  *  Has ROV with GI PA-C on 11/30 at 0900 *  May need follow up EGD to assure gastric healing, but if bx path is benign, will not be needed.     Azucena Freed  08/01/2014, 10:50 AM Pager: [phone]  Seen by Ms. Glyn Ade and we  discussed plan.  Gatha Mayer, MD, [facility]

## 2014-08-01 NOTE — Progress Notes (Signed)
Discharge instructions reviewed with pt; allowing time for questions. Pt verbalized understanding. IV removed without issue. Prescriptions given to pt. Pt leaving unit via wheelchair.

## 2014-08-01 NOTE — Consult Note (Signed)
[facility] Consult:  12:08 PM  07/31/2014  LOS: 1 day   Referring Provider: Dr Waldron Labs  Primary Care Physician:  Elizabeth Palau, MD Primary Gastroenterologist:  Dr. Deatra Ina.    Reason for Consultation:  Vomited dark, maroon blood. Marland Kitchen    HPI: Edward Wells is a 68 y.o. male.  PMH htn and colon polyps. DJD but no significant pain issues.   3 weeks ago developed heartburn, bloating and vomitted clear material  3 x in one day.  Started taking OTC Zantac and gas-ex.  sxs resolved, was eating well and no recurrent dyspepsia.  On Sunday 10/15 around noon developed emesis, this time vomited dark, maroon material and had melenic stool.  Passed out, felt sleepy and went to bed.  Ate light breakfast on Monday.   Sxs never recurred and since he felt well, he waited to see his PMD on Monday.  BP was elevated at office per pt report.   PMD sent him to hospital for eval and pt admitted.    Hgb was 11.6 yesterday, 10.3 today.  MCV normal. Platelets are 127 - 143.  Coags, LFTs and BUN normal.  Has not had recurrent sxs since Sunday's episode.  He did cause some lacerations to bridge of his nose but is having no headache or visual changes.   14 # intentional weight loss in last few months.   No NSAIDs.  No dysphagia.  No abdominal pain.  No anorexia.  Stopped smoking and drinking vodka about one month ago.  Would drink 750 ml bottle of vodka over course of 2 weeks. No history of problems with liver.  No swelling in abdomen or legs.       Past Medical History  Diagnosis Date  . Hypertension   . Acute gastric ulcer with hemorrhage 07/30/2014    Past Surgical History  Procedure Laterality Date  . Knee arthroscopy      left, 2004; right, 2001  . Hammer toe surgery  1984    bilateral  . Ganglion cyst wrist  1971      right, x 2.  . Colonoscopy      Prior to Admission medications   Medication Sig Start Date End Date Taking? Authorizing Provider  Alpha-D-Galactosidase (GAS-X PREVENTION) CAPS Take 1 capsule by mouth daily as needed (for gas).   Yes Historical Provider, MD  amLODipine (NORVASC) 10 MG tablet Take 10 mg by mouth daily.   Yes Historical Provider, MD  cholecalciferol (VITAMIN D) 1000 UNITS tablet Take 1,000 Units by mouth daily.   Yes Historical Provider, MD  Coenzyme Q10 (CO Q 10) 100 MG CAPS Take 100 mg by mouth daily.   Yes Historical Provider, MD  OVER THE COUNTER MEDICATION Take 1 capsule by mouth daily. *over the counter beet root capsule*   Yes Historical Provider, MD  ranitidine (ZANTAC) 150 MG tablet Take 150 mg by mouth daily as needed for heartburn (and acid reflux).   Yes Historical Provider, MD    Scheduled Meds: . pantoprazole  40 mg Oral QAC breakfast  .  sodium chloride  3 mL Intravenous Q12H  . thiamine  100 mg Intravenous Daily   Infusions: . dextrose 5 % and 0.45% NaCl 75 mL/hr at 08/01/14 0933   PRN Meds: acetaminophen, acetaminophen, albuterol, hydrALAZINE, morphine injection, ondansetron (ZOFRAN) IV, ondansetron   Allergies as of 07/30/2014 - Review Complete 07/30/2014  Allergen Reaction Noted  . Furosemide  07/30/2014    Family History  Problem Relation Age of Onset  . Colon cancer Neg Hx   . Stomach cancer Neg Hx   . Hypertension Mother     History   Social History  . Marital Status: Divorced    Spouse Name: N/A    Number of Children: N/A  . Years of Education: N/A    Social History Main Topics  . Smoking status: Former Smoker    Start date: 03/22/2012  . Smokeless tobacco: Never Used  . Alcohol Use: Yes     Comment: occasional  . Drug Use: No  . Sexual Activity: Not on file    Social History Narrative   Retired Set designer.     Moved to [place] ~ 2007.       Would drink 750 ml vodka over the course of 2 weeks.  Quit  etoh and cigs September 2015.     REVIEW OF SYSTEMS: Constitutional:  Generally feels strong and not unwell.  ENT:  No nose bleeds Pulm:  No SOB, no DOE.  No cough CV:  No palpitations, no LE edema.  GU:  No hematuria, no frequency GI:  Per HPI.  No dysphagia.  Heme:  No hx anemia, no unusual bleeding or bruising until 10/25   Transfusions:  None ever Neuro:  No headaches, no peripheral tingling or numbness Derm:  No itching, no rash or sores.  Endocrine:  No sweats or chills.  No polyuria or dysuria Immunization:  Not queried.  Travel:  None beyond local counties in last few months.    PHYSICAL EXAM: Vital signs in last 24 hours: Filed Vitals:   08/01/14 0500  BP: 120/63  Pulse: 73  Temp: 98.2 F (36.8 C)  Resp: 16   Wt Readings from Last 3 Encounters:  07/31/14 230 lb 6.1 oz (104.5 kg)  07/31/14 230 lb 6.1 oz (104.5 kg)  07/06/12 236 lb (107.049 kg)   General: pleasant, comfortable well appearing AAM.  Robust.  Head:  No swelling or asymmetry  Eyes:  No icterus or pallor Ears:  Not HOH  Nose:  No congestion or discharge Mouth:  Clear, moist Neck:  No JVD, no mass, no bruits, no TMG Lungs:  Clear bil.  No dyspnea or cough. Slightly hoarse vocal quality.  Heart: RRR.  No MRG Abdomen:  Soft, active BS, NT, ND.  Slightly obese.   Rectal: deferred   Musc/Skeltl: no joint swelling or contracture Extremities:  No CCE  Neurologic:  Oriented x 3.  No tremor, no limb weakness. Good historian.  Skin:  No rash, no sores Tattoos:  None seen Nodes:  No cervical adenopathy.    Psych:  Pleasant, in good spirits, relaxed, engaged.   Intake/Output from previous day: 10/27 0701 - 10/28 0700 In: 1340 [P.O.:540; I.V.:800] Out: 1500 [Urine:1500] Intake/Output this shift:    LAB RESULTS:  Recent Labs  07/31/14 0121 07/31/14 0747 08/01/14 0626  WBC 7.5 6.7 5.8  HGB 10.3* 10.3* 9.9*  HCT 30.8* 31.6* 29.3*  PLT 127* 131* 134*   BMET Lab Results  Component Value Date  NA 143 08/01/2014   NA 143 07/31/2014   NA 141 07/30/2014   K 3.9 08/01/2014   K 3.8 07/31/2014   K 3.9 07/30/2014   CL 108 08/01/2014   CL 109 07/31/2014   CL 105 07/30/2014   CO2 26 08/01/2014   CO2 24 07/31/2014   CO2 23 07/30/2014   GLUCOSE 107* 08/01/2014   GLUCOSE 101* 07/31/2014   GLUCOSE 150* 07/30/2014   BUN 11 08/01/2014   BUN 19 07/31/2014   BUN 28* 07/30/2014   CREATININE 0.86 08/01/2014   CREATININE 0.91 07/31/2014   CREATININE 0.96 07/30/2014   CALCIUM 9.1 08/01/2014   CALCIUM 8.7 07/31/2014   CALCIUM 9.8 07/30/2014   LFT  Recent Labs  07/30/14 1220 07/31/14 0121  PROT 6.7 5.7*  ALBUMIN 3.7 3.2*  AST 14 10  ALT 19 15  ALKPHOS 66 54  BILITOT 0.3 0.4   PT/INR Lab Results  Component Value Date   INR 1.03 07/30/2014   Lipase     Component Value Date/Time   LIPASE 27 07/30/2014 1220    Ref. Range 07/30/2014 20:33  Iron Latest Range: 42-135 ug/dL 78  UIBC Latest Range: 125-400 ug/dL 199  TIBC Latest Range: 215-435 ug/dL 277  Saturation Ratios Latest Range: 20-55 % 28  Ferritin Latest Range: 22-322 ng/mL 173  Folate No range found >20.0    Drugs of Abuse  No results found for this basename: labopia,  cocainscrnur,  labbenz,  amphetmu,  thcu,  labbarb     RADIOLOGY STUDIES: No results found.  ENDOSCOPIC STUDIES: 07/2012 Colonoscopy, surveillance Sessile polyp lifted and removed at ascending colon (tubular adenoma), diverticulosis, hemorrhoids.   2008 colonoscopy, screening study. moderate pan diverticulosis.  Ascending: 5 mm and 15 mm polyps adenomatous) removed, rectum: 2 mm polyp (hyperplastic)removed.   No prior EGD.   IMPRESSION:   *  GI bleed. Rule out ulcer, gastritis, MW tear, neoplasia.  *  ABL anemia.  MCV normal. Iron studies normal.  *  Thrombocytopenia.  *  Hx adenomatous colon polyps.  Colonoscopy up to date.     PLAN:     *  Needs EGD.  Timing to be determined.  Not convinced he needs PPI drip.  Will change  to BID.  *  CBC in AM.      Azucena Freed  08/01/2014, 10:51 AM Pager: [place] Attending  I have also seen and assessed the patient and agree with the above note. The risks and benefits as well as alternatives of endoscopic procedure(s) have been discussed and reviewed. All questions answered. The patient agrees to proceed.  Gatha Mayer, MD, [facility] [phone] (pager) 08/01/2014 10:51 AM

## 2014-08-01 NOTE — Discharge Summary (Addendum)
Edward Wells, 68 y.o., DOB 1946-07-12, MRN [id number]. Admission date: 07/30/2014 Discharge Date 08/01/2014 Primary MD Elizabeth Palau, MD Admitting Physician Bonnielee Haff, MD  Admission Diagnosis  Upper GI bleed [K92.2] Normocytic anemia due to blood loss [D50.0] Essential hypertension [I10]  Discharge Diagnosis   Principal Problem:   Acute gastric ulcer with hemorrhage Active Problems:   Essential hypertension   Normocytic anemia due to blood loss     Past Medical History  Diagnosis Date  . Hypertension   . Acute gastric ulcer with hemorrhage 07/30/2014    Past Surgical History  Procedure Laterality Date  . Knee arthroscopy      left, 2004; right, 2001  . Hammer toe surgery  1984    bilateral  . Ganglion cyst wrist  1971    right, x 2.  . Colonoscopy     Admission HPI/brief narrative: Edward Wells is a 68 y.o. male with a past medical history of hypertension who was in his usual state of health until about 2 weeks ago when he started experiencing heartburns and gassy sensations in his stomach. He took over-the-counter Zantac and Gas-X to relieve his symptoms. He was also experiencing nausea and vomited every 3 days. Subsequently, the symptoms resolved and he felt better. However, yesterday patient felt nauseated and dizzy in the morning. He vomited blood. And then he passed out. He also had bowel movements which were dark but did not have any blood in them. He felt weak. He went to his bed and fell asleep. He woke up yesterday night and decided to eat some yogurt. Then this morning he went to see his primary care physician who asked him to come in to the emergency department. He denies any abdominal pain. Denies any symptoms such as early satiety. He does admit to losing about 14 pounds in the last few months but states that he has been trying to lose weight. He used to drink alcohol till about 1 month ago. He had about 6 shots of Vodka or brandy every week. Denies any wine or  beer intake. He also quit smoking 1 month ago. Denies ever having had similar bleeding in the past. He is up-to-date on his colonoscopy and last one was done in 2013, which revealed adenomatous polyps.  Patient was noticed to have drop in his hemoglobin from 11.9 baseline to 10.3, vision to was seen by gastroenterology, went EGD on 10/27 was found to have nonbleeding gastric ulcer, which was biopsied ,patient's hemoglobin remained stable 9.9 at date of discharge, she tolerated oral intake, no nausea, no vomiting, no coffee-ground emesis, no further melena .     Hospital Course See H&P, Labs, Consult and Test reports for all details in brief, patient was admitted for **  Principal Problem:   Acute gastric ulcer with hemorrhage Active Problems:   Essential hypertension   Normocytic anemia due to blood loss   GI bleed/acute gastric ulcer -Patient's hemoglobin has been stable -Evidence of nonbleeding gastric ulcer, follow with biopsy result as an outpatient with GI. -Start on Protonix 40 mg oral daily.  Anemia -Anemia of acute blood loss,   Hypertension -Resume back on home medication on discharge  Consults   -Gastroenterology  Procedures -Endoscopy 07/31/14 showing nonbleeding gastric ulcer  Significant Tests:  See full reports for all details    No results found.   Today   Subjective:   Edward Wells today has no headache,no chest abdominal pain,no new weakness tingling or numbness, feels much better wants to go  home today.   Objective:   Blood pressure 120/63, pulse 73, temperature 98.2 F (36.8 C), temperature source Oral, resp. rate 16, height 6' (1.829 m), weight 104.5 kg (230 lb 6.1 oz), SpO2 98.00%.  Intake/Output Summary (Last 24 hours) at 08/01/14 1142 Last data filed at 07/31/14 2300  Gross per 24 hour  Intake   1100 ml  Output   1250 ml  Net   -150 ml    Exam Awake Alert, Oriented *3, No new F.N deficits, Normal affect [place].AT,PERRAL Supple Neck,No JVD,  No cervical lymphadenopathy appriciated.  Symmetrical Chest wall movement, Good air movement bilaterally, CTAB RRR,No Gallops,Rubs or new Murmurs, No Parasternal Heave +ve B.Sounds, Abd Soft, Non tender, No organomegaly appriciated, No rebound -guarding or rigidity. No Cyanosis, Clubbing or edema, No new Rash or bruise  Data Review     CBC w Diff: Lab Results  Component Value Date   WBC 5.8 08/01/2014   HGB 9.9* 08/01/2014   HCT 29.3* 08/01/2014   PLT 134* 08/01/2014   LYMPHOPCT 23 07/30/2014   MONOPCT 6 07/30/2014   EOSPCT 1 07/30/2014   BASOPCT 0 07/30/2014   CMP: Lab Results  Component Value Date   NA 143 08/01/2014   K 3.9 08/01/2014   CL 108 08/01/2014   CO2 26 08/01/2014   BUN 11 08/01/2014   CREATININE 0.86 08/01/2014   PROT 5.7* 07/31/2014   ALBUMIN 3.2* 07/31/2014   BILITOT 0.4 07/31/2014   ALKPHOS 54 07/31/2014   AST 10 07/31/2014   ALT 15 07/31/2014  .  Micro Results No results found for this or any previous visit (from the past 240 hour(s)).   Discharge Instructions     Follow with gastroenterology to 3 weeks from discharge     Follow-up Information   Follow up with Erskine Emery, MD. Schedule an appointment as soon as possible for a visit in 3 weeks.   Specialty:  Gastroenterology   Contact information:   [address] [place] [address] [phone]       Follow up with Elizabeth Palau, MD. Schedule an appointment as soon as possible for a visit in 1 week.   Specialty:  Family Medicine   Contact information:   [address] [place] [address] [phone]       Follow up with Alonza Bogus D., PA-C On 09/03/2014. (GI follow up at [facility].  with the GI PA.)    Specialty:  Gastroenterology   Contact information:   [place] [address] [phone]       Discharge Medications     Medication List    STOP taking these medications       OVER THE COUNTER MEDICATION     ranitidine 150 MG  tablet  Commonly known as:  ZANTAC      TAKE these medications       amLODipine 10 MG tablet  Commonly known as:  NORVASC  Take 10 mg by mouth daily.     cholecalciferol 1000 UNITS tablet  Commonly known as:  VITAMIN D  Take 1,000 Units by mouth daily.     Co Q 10 100 MG Caps  Take 100 mg by mouth daily.     GAS-X PREVENTION Caps  Take 1 capsule by mouth daily as needed (for gas).     pantoprazole 40 MG tablet  Commonly known as:  PROTONIX  Take 1 tablet (40 mg total) by mouth daily.  Total Time in preparing paper work, data evaluation and todays exam - 35 minutes  Melek Pownall M.D on 08/01/2014 at 11:42 AM  [facility] Office  [phone]

## 2014-08-01 NOTE — Discharge Instructions (Signed)
Follow with Primary MD Elizabeth Palau, MD in 7 days   Get CBC, CMP, 2 view Chest X ray checked  by Primary MD next visit.    Activity: As tolerated with Full fall precautions use walker/cane & assistance as needed   Disposition Home    Diet: Heart Healthy  , with feeding assistance and aspiration precautions as needed.  For Heart failure patients - Check your Weight same time everyday, if you gain over 2 pounds, or you develop in leg swelling, experience more shortness of breath or chest pain, call your Primary MD immediately. Follow Cardiac Low Salt Diet and 1.8 lit/day fluid restriction.   On your next visit with your primary care physician please Get Medicines reviewed and adjusted.   Please request your Prim.MD to go over all Hospital Tests and Procedure/Radiological results at the follow up, please get all Hospital records sent to your Prim MD by signing hospital release before you go home.   If you experience worsening of your admission symptoms, develop shortness of breath, life threatening emergency, suicidal or homicidal thoughts you must seek medical attention immediately by calling 911 or calling your MD immediately  if symptoms less severe.  You Must read complete instructions/literature along with all the possible adverse reactions/side effects for all the Medicines you take and that have been prescribed to you. Take any new Medicines after you have completely understood and accpet all the possible adverse reactions/side effects.   Do not drive, operating heavy machinery, perform activities at heights, swimming or participation in water activities or provide baby sitting services if your were admitted for syncope or siezures until you have seen by Primary MD or a Neurologist and advised to do so again.  Do not drive when taking Pain medications.    Do not take more than prescribed Pain, Sleep and Anxiety Medications  Special Instructions: If you have smoked or  chewed Tobacco  in the last 2 yrs please stop smoking, stop any regular Alcohol  and or any Recreational drug use.  Wear Seat belts while driving.   Please note  You were cared for by a hospitalist during your hospital stay. If you have any questions about your discharge medications or the care you received while you were in the hospital after you are discharged, you can call the unit and asked to speak with the hospitalist on call if the hospitalist that took care of you is not available. Once you are discharged, your primary care physician will handle any further medical issues. Please note that NO REFILLS for any discharge medications will be authorized once you are discharged, as it is imperative that you return to your primary care physician (or establish a relationship with a primary care physician if you do not have one) for your aftercare needs so that they can reassess your need for medications and monitor your lab values.

## 2014-08-02 ENCOUNTER — Encounter (HOSPITAL_COMMUNITY): Payer: Self-pay | Admitting: Internal Medicine

## 2014-08-02 ENCOUNTER — Encounter: Payer: Self-pay | Admitting: Internal Medicine

## 2014-09-03 ENCOUNTER — Encounter: Payer: Self-pay | Admitting: Physician Assistant

## 2014-09-03 ENCOUNTER — Ambulatory Visit (INDEPENDENT_AMBULATORY_CARE_PROVIDER_SITE_OTHER): Payer: Medicare Other | Admitting: Physician Assistant

## 2014-09-03 ENCOUNTER — Other Ambulatory Visit (INDEPENDENT_AMBULATORY_CARE_PROVIDER_SITE_OTHER): Payer: Medicare Other

## 2014-09-03 VITALS — BP 142/80 | HR 72 | Ht 72.0 in | Wt 237.0 lb

## 2014-09-03 DIAGNOSIS — K250 Acute gastric ulcer with hemorrhage: Secondary | ICD-10-CM

## 2014-09-03 DIAGNOSIS — D62 Acute posthemorrhagic anemia: Secondary | ICD-10-CM

## 2014-09-03 LAB — CBC WITH DIFFERENTIAL/PLATELET
Basophils Absolute: 0 10*3/uL (ref 0.0–0.1)
Basophils Relative: 0.4 % (ref 0.0–3.0)
EOS ABS: 0.2 10*3/uL (ref 0.0–0.7)
Eosinophils Relative: 2.3 % (ref 0.0–5.0)
HCT: 42 % (ref 39.0–52.0)
Hemoglobin: 13.5 g/dL (ref 13.0–17.0)
Lymphocytes Relative: 25.7 % (ref 12.0–46.0)
Lymphs Abs: 1.7 10*3/uL (ref 0.7–4.0)
MCHC: 32.1 g/dL (ref 30.0–36.0)
MCV: 83 fl (ref 78.0–100.0)
MONOS PCT: 6.3 % (ref 3.0–12.0)
Monocytes Absolute: 0.4 10*3/uL (ref 0.1–1.0)
NEUTROS PCT: 65.3 % (ref 43.0–77.0)
Neutro Abs: 4.4 10*3/uL (ref 1.4–7.7)
Platelets: 193 10*3/uL (ref 150.0–400.0)
RBC: 5.06 Mil/uL (ref 4.22–5.81)
RDW: 14 % (ref 11.5–15.5)
WBC: 6.8 10*3/uL (ref 4.0–10.5)

## 2014-09-03 MED ORDER — PANTOPRAZOLE SODIUM 40 MG PO TBEC: 40.0000 mg | DELAYED_RELEASE_TABLET | Freq: Every day | ORAL | Status: DC

## 2014-09-03 NOTE — Patient Instructions (Addendum)
You have been scheduled for an endoscopy. Please follow written instructions given to you at your visit today. If you use inhalers (even only as needed), please bring them with you on the day of your procedure.  We have sent the following medications to your pharmacy for you to pick up at your convenience: Protonix   Take this medicine at least until you have your procedure in February.   I appreciate the opportunity to care for you.

## 2014-09-03 NOTE — Progress Notes (Signed)
Patient ID: Edward Wells, male   DOB: [birth year], 68 y.o.   MRN: [id number]   Subjective:    Patient ID: Edward Wells, male    DOB: 23-Oct-1945, 68 y.o.   MRN: [id number]  HPI Edward Wells is a pleasant 68 year old African-American male known to Dr. Deatra Wells from prior colonoscopies. Patient had a recent hospitalization on 07/30/2014 with an acute upper GI bleed. He did not have any implants of abdominal pain but was nauseated for 2 days prior to vomiting up dark maroonish blood. EGD was done by Dr. Carlean Wells and was found to have a single clean-based gastric ulcer along the lesser curve measuring about 15 mm. Biopsies were taken and these were negative for dysplasia, no evidence for H. pylori. He did not require transfusion, hemoglobin was 9.9 on discharge.  Patient comes in today for follow-up. He says he finished his prescription for Protonix. He states that he feels fine and has absolutely no GI symptoms. He did not have any further melena after discharge. He is no complaints of chronic heartburn or ingestion. He had not been on any aspirin or NSAIDs.  Review of Systems Pertinent positive and negative review of systems were noted in the above HPI section.  All other review of systems was otherwise negative.  Outpatient Encounter Prescriptions as of 09/03/2014  Medication Sig  . amLODipine (NORVASC) 10 MG tablet Take 10 mg by mouth daily.  . cholecalciferol (VITAMIN D) 1000 UNITS tablet Take 1,000 Units by mouth daily.  . Coenzyme Q10 (CO Q 10) 100 MG CAPS Take 100 mg by mouth daily.  . pantoprazole (PROTONIX) 40 MG tablet Take 1 tablet (40 mg total) by mouth daily before breakfast.  . [DISCONTINUED] Alpha-D-Galactosidase (GAS-X PREVENTION) CAPS Take 1 capsule by mouth daily as needed (for gas).  . [DISCONTINUED] pantoprazole (PROTONIX) 40 MG tablet Take 1 tablet (40 mg total) by mouth daily.   Allergies  Allergen Reactions  . Furosemide     Muscle cramps   Patient Active Problem List   Diagnosis Date  Noted  . Acute gastric ulcer with hemorrhage 07/30/2014  . Essential hypertension 07/30/2014  . Normocytic anemia due to blood loss 07/30/2014   History   Social History  . Marital Status: Divorced    Spouse Name: N/A    Number of Children: N/A  . Years of Education: N/A   Occupational History  . Not on file.   Social History Main Topics  . Smoking status: Former Smoker    Start date: 03/22/2012  . Smokeless tobacco: Never Used  . Alcohol Use: Yes     Comment: occasional  . Drug Use: No  . Sexual Activity: Not on file   Other Topics Concern  . Not on file   Social History Narrative   Retired Set designer.     Moved to [place] ~ 2007.       Would drink 750 ml vodka over the course of 2 weeks.  Quit etoh and cigs September 2015.     Mr. Tuminello family history includes Hypertension in his mother. There is no history of Colon cancer or Stomach cancer.      Objective:    Filed Vitals:   09/03/14 0842  BP: 142/80  Pulse: 72    Physical Exam  well-developed older African-American male in no acute distress, Height 6 foot weight 237. HEENT; nontraumatic normocephalic EOMI PERRLA sclerae anicteric, Supple ;no JVD, Cardiovascular ;regular rate and rhythm with S1-S2 no murmur or gallop, Pulmonary; clear bilaterally,  Abdomen; large soft nontender nondistended bowel sounds are active there is no palpable mass or hepatosplenomegaly, Rectal ;exam not done, Ext;no clubbing cyanosis or edema skin warm and dry, Psych; mood and affect appropriate     Assessment & Plan:   #15 68 year old African-American male status post recent hospitalization with acute GI bleed secondary to gastric ulcer. Biopsies negative for dysplasia negative for H. pylori. Patient denies any aspirin or NSAID use. #2 history of adenomatous colon polyps last colonoscopy 2013 with one adenomatous polyp -will need follow-up 2018 #3 diverticulosis  Plan; patient needs to be treated for at least 3  months to assure healing of gastric ulcer. We will restart Protonix 40 mg by mouth every morning Check follow-up CBC today Schedule for follow-up EGD to document healing. This is scheduled for early February with Dr. Deatra Wells.  Edward Genia Harold PA-C 09/03/2014

## 2014-09-03 NOTE — Progress Notes (Signed)
Reviewed and agree with management. Cherrish Vitali D. Maylee Bare, M.D., FACG  

## 2014-11-08 ENCOUNTER — Ambulatory Visit (AMBULATORY_SURGERY_CENTER): Payer: Medicare Other | Admitting: Gastroenterology

## 2014-11-08 ENCOUNTER — Encounter: Payer: Self-pay | Admitting: Gastroenterology

## 2014-11-08 VITALS — BP 140/88 | HR 64 | Temp 98.0°F | Resp 16 | Ht 72.0 in | Wt 237.0 lb

## 2014-11-08 DIAGNOSIS — K2970 Gastritis, unspecified, without bleeding: Secondary | ICD-10-CM

## 2014-11-08 DIAGNOSIS — K250 Acute gastric ulcer with hemorrhage: Secondary | ICD-10-CM

## 2014-11-08 DIAGNOSIS — K2990 Gastroduodenitis, unspecified, without bleeding: Secondary | ICD-10-CM

## 2014-11-08 DIAGNOSIS — B9681 Helicobacter pylori [H. pylori] as the cause of diseases classified elsewhere: Secondary | ICD-10-CM

## 2014-11-08 MED ORDER — SODIUM CHLORIDE 0.9 % IV SOLN: 500.0000 mL | INTRAVENOUS | Status: DC

## 2014-11-08 MED ORDER — PANTOPRAZOLE SODIUM 40 MG PO TBEC: 40.0000 mg | DELAYED_RELEASE_TABLET | Freq: Every day | ORAL | Status: DC

## 2014-11-08 NOTE — Progress Notes (Signed)
Stable to RR 

## 2014-11-08 NOTE — Progress Notes (Signed)
Called to room to assist during endoscopic procedure.  Patient ID and intended procedure confirmed with present staff. Received instructions for my participation in the procedure from the performing physician.  

## 2014-11-08 NOTE — Patient Instructions (Signed)

## 2014-11-08 NOTE — Op Note (Signed)
[facility]  [address]. [place], [id number]   ENDOSCOPY PROCEDURE REPORT  PATIENT: Edward, Wells  MR#: [id number] BIRTHDATE: 19-Sep-1946 , 63  yrs. old GENDER: male ENDOSCOPIST: Inda Castle, MD REFERRED BY:  Lindwood Qua, M.D. PROCEDURE DATE:  11/08/2014 PROCEDURE:  EGD w/ biopsy ASA CLASS:     Class II INDICATIONS:  history of bleeding gastric ulcer. MEDICATIONS: Monitored anesthesia care, Propofol 100 mg IV, and lidocaine 40 mg IV TOPICAL ANESTHETIC:  DESCRIPTION OF PROCEDURE: After the risks benefits and alternatives of the procedure were thoroughly explained, informed consent was obtained.  The LB [id number] endoscope was introduced through the mouth and advanced to the second portion of the duodenum , Without limitations.  The instrument was slowly withdrawn as the mucosa was fully examined.    There are enlarged but soft full some the gastric fundus.  The gastric fundus with diffuse areas of spotty erythema.    The mucosa was diffusely spotty and erythematous.  Biopsies were taken. Retroflexed views revealed no abnormalities.  The remainder the exam was normal.   The scope was then withdrawn from the patient and the procedure completed.  COMPLICATIONS: There were no immediate complications.  ENDOSCOPIC IMPRESSION: 1.   gastritis  RECOMMENDATIONS: 1.  Await biopsy results 2.  Continue PPI  REPEAT EXAM:  eSigned:  Inda Castle, MD 11/08/2014 1:59 PM    CC:

## 2014-11-09 ENCOUNTER — Telehealth: Payer: Self-pay | Admitting: *Deleted

## 2014-11-09 NOTE — Telephone Encounter (Signed)
  Follow up Call-  Call back number 11/08/2014 07/06/2012  Post procedure Call Back phone  # [phone] answering machine may not work  Permission to leave phone message Yes Yes     Patient questions:  Do you have a fever, pain , or abdominal swelling? No. Pain Score  0 *  Have you tolerated food without any problems? Yes.    Have you been able to return to your normal activities? Yes.    Do you have any questions about your discharge instructions: Diet   No. Medications  No. Follow up visit  No.  Do you have questions or concerns about your Care? No.  Actions: * If pain score is 4 or above: No action needed, pain <4.

## 2014-11-16 ENCOUNTER — Other Ambulatory Visit: Payer: Self-pay

## 2014-11-16 MED ORDER — AMOXICILL-CLARITHRO-LANSOPRAZ PO MISC: Freq: Two times a day (BID) | ORAL | Status: DC

## 2015-03-26 ENCOUNTER — Other Ambulatory Visit: Payer: Self-pay | Admitting: Gastroenterology

## 2016-12-16 DIAGNOSIS — Z8719 Personal history of other diseases of the digestive system: Secondary | ICD-10-CM | POA: Diagnosis not present

## 2016-12-16 DIAGNOSIS — I10 Essential (primary) hypertension: Secondary | ICD-10-CM | POA: Diagnosis not present

## 2016-12-16 DIAGNOSIS — R7303 Prediabetes: Secondary | ICD-10-CM | POA: Diagnosis not present

## 2016-12-16 DIAGNOSIS — I119 Hypertensive heart disease without heart failure: Secondary | ICD-10-CM | POA: Diagnosis not present

## 2016-12-16 DIAGNOSIS — E6609 Other obesity due to excess calories: Secondary | ICD-10-CM | POA: Diagnosis not present

## 2016-12-16 DIAGNOSIS — Z720 Tobacco use: Secondary | ICD-10-CM | POA: Diagnosis not present

## 2017-04-19 DIAGNOSIS — R7303 Prediabetes: Secondary | ICD-10-CM | POA: Diagnosis not present

## 2017-04-19 DIAGNOSIS — Z720 Tobacco use: Secondary | ICD-10-CM | POA: Diagnosis not present

## 2017-04-19 DIAGNOSIS — Z8719 Personal history of other diseases of the digestive system: Secondary | ICD-10-CM | POA: Diagnosis not present

## 2017-04-19 DIAGNOSIS — Z0000 Encounter for general adult medical examination without abnormal findings: Secondary | ICD-10-CM | POA: Diagnosis not present

## 2017-04-19 DIAGNOSIS — I10 Essential (primary) hypertension: Secondary | ICD-10-CM | POA: Diagnosis not present

## 2017-04-19 DIAGNOSIS — I119 Hypertensive heart disease without heart failure: Secondary | ICD-10-CM | POA: Diagnosis not present

## 2017-04-19 DIAGNOSIS — E6609 Other obesity due to excess calories: Secondary | ICD-10-CM | POA: Diagnosis not present

## 2017-04-19 DIAGNOSIS — Z125 Encounter for screening for malignant neoplasm of prostate: Secondary | ICD-10-CM | POA: Diagnosis not present

## 2017-07-19 DIAGNOSIS — I10 Essential (primary) hypertension: Secondary | ICD-10-CM | POA: Diagnosis not present

## 2017-07-19 DIAGNOSIS — I119 Hypertensive heart disease without heart failure: Secondary | ICD-10-CM | POA: Diagnosis not present

## 2017-07-19 DIAGNOSIS — Z8719 Personal history of other diseases of the digestive system: Secondary | ICD-10-CM | POA: Diagnosis not present

## 2017-07-19 DIAGNOSIS — Z720 Tobacco use: Secondary | ICD-10-CM | POA: Diagnosis not present

## 2017-07-19 DIAGNOSIS — R7303 Prediabetes: Secondary | ICD-10-CM | POA: Diagnosis not present

## 2017-07-19 DIAGNOSIS — E6609 Other obesity due to excess calories: Secondary | ICD-10-CM | POA: Diagnosis not present

## 2017-10-12 DIAGNOSIS — Z8719 Personal history of other diseases of the digestive system: Secondary | ICD-10-CM | POA: Diagnosis not present

## 2017-10-12 DIAGNOSIS — I119 Hypertensive heart disease without heart failure: Secondary | ICD-10-CM | POA: Diagnosis not present

## 2017-10-12 DIAGNOSIS — E6609 Other obesity due to excess calories: Secondary | ICD-10-CM | POA: Diagnosis not present

## 2017-10-12 DIAGNOSIS — R739 Hyperglycemia, unspecified: Secondary | ICD-10-CM | POA: Diagnosis not present

## 2017-10-12 DIAGNOSIS — R7303 Prediabetes: Secondary | ICD-10-CM | POA: Diagnosis not present

## 2017-10-12 DIAGNOSIS — I10 Essential (primary) hypertension: Secondary | ICD-10-CM | POA: Diagnosis not present

## 2017-10-12 DIAGNOSIS — Z720 Tobacco use: Secondary | ICD-10-CM | POA: Diagnosis not present

## 2017-11-01 DIAGNOSIS — M25562 Pain in left knee: Secondary | ICD-10-CM | POA: Diagnosis not present

## 2017-11-01 DIAGNOSIS — M170 Bilateral primary osteoarthritis of knee: Secondary | ICD-10-CM | POA: Diagnosis not present

## 2017-11-01 DIAGNOSIS — M25561 Pain in right knee: Secondary | ICD-10-CM | POA: Diagnosis not present

## 2017-11-01 DIAGNOSIS — M1712 Unilateral primary osteoarthritis, left knee: Secondary | ICD-10-CM | POA: Diagnosis not present

## 2017-11-09 DIAGNOSIS — M25562 Pain in left knee: Secondary | ICD-10-CM | POA: Diagnosis not present

## 2017-11-09 DIAGNOSIS — M1712 Unilateral primary osteoarthritis, left knee: Secondary | ICD-10-CM | POA: Diagnosis not present

## 2017-11-16 DIAGNOSIS — M25562 Pain in left knee: Secondary | ICD-10-CM | POA: Diagnosis not present

## 2017-11-16 DIAGNOSIS — M1712 Unilateral primary osteoarthritis, left knee: Secondary | ICD-10-CM | POA: Diagnosis not present

## 2017-11-23 DIAGNOSIS — M25561 Pain in right knee: Secondary | ICD-10-CM | POA: Diagnosis not present

## 2017-11-23 DIAGNOSIS — M1711 Unilateral primary osteoarthritis, right knee: Secondary | ICD-10-CM | POA: Diagnosis not present

## 2017-11-30 DIAGNOSIS — M1711 Unilateral primary osteoarthritis, right knee: Secondary | ICD-10-CM | POA: Diagnosis not present

## 2017-11-30 DIAGNOSIS — M25561 Pain in right knee: Secondary | ICD-10-CM | POA: Diagnosis not present

## 2017-12-07 DIAGNOSIS — M25561 Pain in right knee: Secondary | ICD-10-CM | POA: Diagnosis not present

## 2017-12-07 DIAGNOSIS — M1711 Unilateral primary osteoarthritis, right knee: Secondary | ICD-10-CM | POA: Diagnosis not present

## 2018-02-08 DIAGNOSIS — I10 Essential (primary) hypertension: Secondary | ICD-10-CM | POA: Diagnosis not present

## 2018-02-08 DIAGNOSIS — I119 Hypertensive heart disease without heart failure: Secondary | ICD-10-CM | POA: Diagnosis not present

## 2018-02-08 DIAGNOSIS — R7303 Prediabetes: Secondary | ICD-10-CM | POA: Diagnosis not present

## 2018-02-08 DIAGNOSIS — Z0000 Encounter for general adult medical examination without abnormal findings: Secondary | ICD-10-CM | POA: Diagnosis not present

## 2018-02-08 DIAGNOSIS — Z131 Encounter for screening for diabetes mellitus: Secondary | ICD-10-CM | POA: Diagnosis not present

## 2018-02-08 DIAGNOSIS — Z720 Tobacco use: Secondary | ICD-10-CM | POA: Diagnosis not present

## 2018-02-08 DIAGNOSIS — E6609 Other obesity due to excess calories: Secondary | ICD-10-CM | POA: Diagnosis not present

## 2018-02-08 DIAGNOSIS — Z136 Encounter for screening for cardiovascular disorders: Secondary | ICD-10-CM | POA: Diagnosis not present

## 2018-02-08 DIAGNOSIS — Z01118 Encounter for examination of ears and hearing with other abnormal findings: Secondary | ICD-10-CM | POA: Diagnosis not present

## 2018-02-08 DIAGNOSIS — Z125 Encounter for screening for malignant neoplasm of prostate: Secondary | ICD-10-CM | POA: Diagnosis not present

## 2018-03-16 DIAGNOSIS — M170 Bilateral primary osteoarthritis of knee: Secondary | ICD-10-CM | POA: Diagnosis not present

## 2018-03-16 DIAGNOSIS — M1712 Unilateral primary osteoarthritis, left knee: Secondary | ICD-10-CM | POA: Diagnosis not present

## 2018-03-16 DIAGNOSIS — M25562 Pain in left knee: Secondary | ICD-10-CM | POA: Diagnosis not present

## 2018-03-16 DIAGNOSIS — M25561 Pain in right knee: Secondary | ICD-10-CM | POA: Diagnosis not present

## 2018-03-21 DIAGNOSIS — M1712 Unilateral primary osteoarthritis, left knee: Secondary | ICD-10-CM | POA: Diagnosis not present

## 2018-03-21 DIAGNOSIS — M25562 Pain in left knee: Secondary | ICD-10-CM | POA: Diagnosis not present

## 2018-03-28 DIAGNOSIS — M1712 Unilateral primary osteoarthritis, left knee: Secondary | ICD-10-CM | POA: Diagnosis not present

## 2018-03-28 DIAGNOSIS — M25562 Pain in left knee: Secondary | ICD-10-CM | POA: Diagnosis not present

## 2018-04-04 DIAGNOSIS — M25562 Pain in left knee: Secondary | ICD-10-CM | POA: Diagnosis not present

## 2018-04-04 DIAGNOSIS — M1712 Unilateral primary osteoarthritis, left knee: Secondary | ICD-10-CM | POA: Diagnosis not present

## 2018-04-11 DIAGNOSIS — M1712 Unilateral primary osteoarthritis, left knee: Secondary | ICD-10-CM | POA: Diagnosis not present

## 2018-04-11 DIAGNOSIS — M25562 Pain in left knee: Secondary | ICD-10-CM | POA: Diagnosis not present

## 2018-04-18 DIAGNOSIS — M25561 Pain in right knee: Secondary | ICD-10-CM | POA: Diagnosis not present

## 2018-04-18 DIAGNOSIS — M1711 Unilateral primary osteoarthritis, right knee: Secondary | ICD-10-CM | POA: Diagnosis not present

## 2018-04-25 DIAGNOSIS — M1711 Unilateral primary osteoarthritis, right knee: Secondary | ICD-10-CM | POA: Diagnosis not present

## 2018-04-25 DIAGNOSIS — M25561 Pain in right knee: Secondary | ICD-10-CM | POA: Diagnosis not present

## 2018-05-02 DIAGNOSIS — M25561 Pain in right knee: Secondary | ICD-10-CM | POA: Diagnosis not present

## 2018-05-02 DIAGNOSIS — M1711 Unilateral primary osteoarthritis, right knee: Secondary | ICD-10-CM | POA: Diagnosis not present

## 2018-05-04 DIAGNOSIS — E119 Type 2 diabetes mellitus without complications: Secondary | ICD-10-CM | POA: Diagnosis not present

## 2018-05-04 DIAGNOSIS — Z0000 Encounter for general adult medical examination without abnormal findings: Secondary | ICD-10-CM | POA: Diagnosis not present

## 2018-05-04 DIAGNOSIS — I119 Hypertensive heart disease without heart failure: Secondary | ICD-10-CM | POA: Diagnosis not present

## 2018-05-04 DIAGNOSIS — E6609 Other obesity due to excess calories: Secondary | ICD-10-CM | POA: Diagnosis not present

## 2018-05-04 DIAGNOSIS — I10 Essential (primary) hypertension: Secondary | ICD-10-CM | POA: Diagnosis not present

## 2018-05-04 DIAGNOSIS — Z720 Tobacco use: Secondary | ICD-10-CM | POA: Diagnosis not present

## 2018-05-09 DIAGNOSIS — M1711 Unilateral primary osteoarthritis, right knee: Secondary | ICD-10-CM | POA: Diagnosis not present

## 2018-05-09 DIAGNOSIS — M25561 Pain in right knee: Secondary | ICD-10-CM | POA: Diagnosis not present

## 2018-05-16 DIAGNOSIS — M1711 Unilateral primary osteoarthritis, right knee: Secondary | ICD-10-CM | POA: Diagnosis not present

## 2018-05-16 DIAGNOSIS — M25561 Pain in right knee: Secondary | ICD-10-CM | POA: Diagnosis not present

## 2018-06-01 DIAGNOSIS — I10 Essential (primary) hypertension: Secondary | ICD-10-CM | POA: Diagnosis not present

## 2018-06-01 DIAGNOSIS — I119 Hypertensive heart disease without heart failure: Secondary | ICD-10-CM | POA: Diagnosis not present

## 2018-06-01 DIAGNOSIS — E119 Type 2 diabetes mellitus without complications: Secondary | ICD-10-CM | POA: Diagnosis not present

## 2018-06-01 DIAGNOSIS — E6609 Other obesity due to excess calories: Secondary | ICD-10-CM | POA: Diagnosis not present

## 2018-06-01 DIAGNOSIS — Z720 Tobacco use: Secondary | ICD-10-CM | POA: Diagnosis not present

## 2018-07-14 DIAGNOSIS — E6609 Other obesity due to excess calories: Secondary | ICD-10-CM | POA: Diagnosis not present

## 2018-07-14 DIAGNOSIS — E119 Type 2 diabetes mellitus without complications: Secondary | ICD-10-CM | POA: Diagnosis not present

## 2018-07-14 DIAGNOSIS — I119 Hypertensive heart disease without heart failure: Secondary | ICD-10-CM | POA: Diagnosis not present

## 2018-07-14 DIAGNOSIS — Z720 Tobacco use: Secondary | ICD-10-CM | POA: Diagnosis not present

## 2018-07-14 DIAGNOSIS — I10 Essential (primary) hypertension: Secondary | ICD-10-CM | POA: Diagnosis not present

## 2018-08-16 DIAGNOSIS — M25561 Pain in right knee: Secondary | ICD-10-CM | POA: Diagnosis not present

## 2018-08-16 DIAGNOSIS — M1712 Unilateral primary osteoarthritis, left knee: Secondary | ICD-10-CM | POA: Diagnosis not present

## 2018-08-16 DIAGNOSIS — M170 Bilateral primary osteoarthritis of knee: Secondary | ICD-10-CM | POA: Diagnosis not present

## 2018-08-16 DIAGNOSIS — M25562 Pain in left knee: Secondary | ICD-10-CM | POA: Diagnosis not present

## 2018-08-22 DIAGNOSIS — M25561 Pain in right knee: Secondary | ICD-10-CM | POA: Diagnosis not present

## 2018-08-22 DIAGNOSIS — M1711 Unilateral primary osteoarthritis, right knee: Secondary | ICD-10-CM | POA: Diagnosis not present

## 2018-08-24 DIAGNOSIS — Z720 Tobacco use: Secondary | ICD-10-CM | POA: Diagnosis not present

## 2018-08-24 DIAGNOSIS — E6609 Other obesity due to excess calories: Secondary | ICD-10-CM | POA: Diagnosis not present

## 2018-08-24 DIAGNOSIS — I10 Essential (primary) hypertension: Secondary | ICD-10-CM | POA: Diagnosis not present

## 2018-08-24 DIAGNOSIS — I119 Hypertensive heart disease without heart failure: Secondary | ICD-10-CM | POA: Diagnosis not present

## 2018-08-24 DIAGNOSIS — E119 Type 2 diabetes mellitus without complications: Secondary | ICD-10-CM | POA: Diagnosis not present

## 2018-08-25 DIAGNOSIS — M1712 Unilateral primary osteoarthritis, left knee: Secondary | ICD-10-CM | POA: Diagnosis not present

## 2018-08-25 DIAGNOSIS — M25562 Pain in left knee: Secondary | ICD-10-CM | POA: Diagnosis not present

## 2018-08-30 DIAGNOSIS — M25561 Pain in right knee: Secondary | ICD-10-CM | POA: Diagnosis not present

## 2018-08-30 DIAGNOSIS — M1711 Unilateral primary osteoarthritis, right knee: Secondary | ICD-10-CM | POA: Diagnosis not present

## 2018-12-05 DIAGNOSIS — M170 Bilateral primary osteoarthritis of knee: Secondary | ICD-10-CM | POA: Diagnosis not present

## 2018-12-05 DIAGNOSIS — M25561 Pain in right knee: Secondary | ICD-10-CM | POA: Diagnosis not present

## 2018-12-05 DIAGNOSIS — M1712 Unilateral primary osteoarthritis, left knee: Secondary | ICD-10-CM | POA: Diagnosis not present

## 2018-12-05 DIAGNOSIS — M25562 Pain in left knee: Secondary | ICD-10-CM | POA: Diagnosis not present

## 2018-12-06 DIAGNOSIS — M1711 Unilateral primary osteoarthritis, right knee: Secondary | ICD-10-CM | POA: Diagnosis not present

## 2018-12-06 DIAGNOSIS — M25561 Pain in right knee: Secondary | ICD-10-CM | POA: Diagnosis not present

## 2018-12-13 DIAGNOSIS — M1712 Unilateral primary osteoarthritis, left knee: Secondary | ICD-10-CM | POA: Diagnosis not present

## 2018-12-13 DIAGNOSIS — M25562 Pain in left knee: Secondary | ICD-10-CM | POA: Diagnosis not present

## 2018-12-14 DIAGNOSIS — M1711 Unilateral primary osteoarthritis, right knee: Secondary | ICD-10-CM | POA: Diagnosis not present

## 2018-12-14 DIAGNOSIS — M25561 Pain in right knee: Secondary | ICD-10-CM | POA: Diagnosis not present

## 2018-12-20 DIAGNOSIS — M1712 Unilateral primary osteoarthritis, left knee: Secondary | ICD-10-CM | POA: Diagnosis not present

## 2018-12-20 DIAGNOSIS — M25562 Pain in left knee: Secondary | ICD-10-CM | POA: Diagnosis not present

## 2018-12-21 DIAGNOSIS — M1711 Unilateral primary osteoarthritis, right knee: Secondary | ICD-10-CM | POA: Diagnosis not present

## 2018-12-21 DIAGNOSIS — M25561 Pain in right knee: Secondary | ICD-10-CM | POA: Diagnosis not present

## 2018-12-28 DIAGNOSIS — M25562 Pain in left knee: Secondary | ICD-10-CM | POA: Diagnosis not present

## 2018-12-28 DIAGNOSIS — M170 Bilateral primary osteoarthritis of knee: Secondary | ICD-10-CM | POA: Diagnosis not present

## 2018-12-28 DIAGNOSIS — M25561 Pain in right knee: Secondary | ICD-10-CM | POA: Diagnosis not present

## 2019-01-11 DIAGNOSIS — M25561 Pain in right knee: Secondary | ICD-10-CM | POA: Diagnosis not present

## 2019-01-11 DIAGNOSIS — M170 Bilateral primary osteoarthritis of knee: Secondary | ICD-10-CM | POA: Diagnosis not present

## 2019-01-11 DIAGNOSIS — M25562 Pain in left knee: Secondary | ICD-10-CM | POA: Diagnosis not present

## 2019-02-16 ENCOUNTER — Emergency Department (HOSPITAL_COMMUNITY): Payer: Medicare Other

## 2019-02-16 ENCOUNTER — Encounter (HOSPITAL_COMMUNITY): Payer: Self-pay

## 2019-02-16 ENCOUNTER — Observation Stay (HOSPITAL_COMMUNITY)
Admission: EM | Admit: 2019-02-16 | Discharge: 2019-02-17 | Disposition: A | Discharge status: Home / Self Care | Payer: Medicare Other | Attending: Internal Medicine | Admitting: Internal Medicine

## 2019-02-16 ENCOUNTER — Other Ambulatory Visit: Payer: Self-pay

## 2019-02-16 DIAGNOSIS — Z888 Allergy status to other drugs, medicaments and biological substances status: Secondary | ICD-10-CM | POA: Insufficient documentation

## 2019-02-16 DIAGNOSIS — R319 Hematuria, unspecified: Secondary | ICD-10-CM | POA: Insufficient documentation

## 2019-02-16 DIAGNOSIS — Z79899 Other long term (current) drug therapy: Secondary | ICD-10-CM | POA: Insufficient documentation

## 2019-02-16 DIAGNOSIS — A4151 Sepsis due to Escherichia coli [E. coli]: Secondary | ICD-10-CM | POA: Diagnosis not present

## 2019-02-16 DIAGNOSIS — Z8249 Family history of ischemic heart disease and other diseases of the circulatory system: Secondary | ICD-10-CM | POA: Insufficient documentation

## 2019-02-16 DIAGNOSIS — J9811 Atelectasis: Secondary | ICD-10-CM | POA: Diagnosis not present

## 2019-02-16 DIAGNOSIS — N390 Urinary tract infection, site not specified: Secondary | ICD-10-CM | POA: Diagnosis not present

## 2019-02-16 DIAGNOSIS — N179 Acute kidney failure, unspecified: Secondary | ICD-10-CM | POA: Diagnosis not present

## 2019-02-16 DIAGNOSIS — A419 Sepsis, unspecified organism: Secondary | ICD-10-CM | POA: Diagnosis present

## 2019-02-16 DIAGNOSIS — R300 Dysuria: Secondary | ICD-10-CM | POA: Diagnosis not present

## 2019-02-16 DIAGNOSIS — J90 Pleural effusion, not elsewhere classified: Secondary | ICD-10-CM | POA: Diagnosis not present

## 2019-02-16 DIAGNOSIS — I10 Essential (primary) hypertension: Secondary | ICD-10-CM | POA: Diagnosis present

## 2019-02-16 DIAGNOSIS — N319 Neuromuscular dysfunction of bladder, unspecified: Secondary | ICD-10-CM | POA: Diagnosis not present

## 2019-02-16 DIAGNOSIS — Z20828 Contact with and (suspected) exposure to other viral communicable diseases: Secondary | ICD-10-CM | POA: Diagnosis not present

## 2019-02-16 DIAGNOSIS — Z87891 Personal history of nicotine dependence: Secondary | ICD-10-CM | POA: Insufficient documentation

## 2019-02-16 DIAGNOSIS — Z66 Do not resuscitate: Secondary | ICD-10-CM | POA: Insufficient documentation

## 2019-02-16 DIAGNOSIS — R739 Hyperglycemia, unspecified: Secondary | ICD-10-CM | POA: Diagnosis not present

## 2019-02-16 LAB — URINALYSIS, ROUTINE W REFLEX MICROSCOPIC
Bilirubin Urine: NEGATIVE
Glucose, UA: NEGATIVE mg/dL
Ketones, ur: NEGATIVE mg/dL
Nitrite: NEGATIVE
Protein, ur: 100 mg/dL — AB
RBC / HPF: >50 RBC/hpf — ABNORMAL HIGH (ref 0–5)
Specific Gravity, Urine: 1.016 (ref 1.005–1.030)
WBC, UA: >50 WBC/hpf — ABNORMAL HIGH (ref 0–5)
pH: 5 (ref 5.0–8.0)

## 2019-02-16 LAB — CBC WITH DIFFERENTIAL/PLATELET
Abs Immature Granulocytes: 0.42 10*3/uL — ABNORMAL HIGH (ref 0.00–0.07)
Basophils Absolute: 0 10*3/uL (ref 0.0–0.1)
Basophils Relative: 0 %
Eosinophils Absolute: 0 10*3/uL (ref 0.0–0.5)
Eosinophils Relative: 0 %
HCT: 44.8 % (ref 39.0–52.0)
Hemoglobin: 15 g/dL (ref 13.0–17.0)
Immature Granulocytes: 3 %
Lymphocytes Relative: 5 %
Lymphs Abs: 0.7 10*3/uL (ref 0.7–4.0)
MCH: 27.4 pg (ref 26.0–34.0)
MCHC: 33.5 g/dL (ref 30.0–36.0)
MCV: 81.8 fL (ref 80.0–100.0)
Monocytes Absolute: 1 10*3/uL (ref 0.1–1.0)
Monocytes Relative: 8 %
Neutro Abs: 11.2 10*3/uL — ABNORMAL HIGH (ref 1.7–7.7)
Neutrophils Relative %: 84 %
Platelets: 119 10*3/uL — ABNORMAL LOW (ref 150–400)
RBC: 5.48 MIL/uL (ref 4.22–5.81)
RDW: 13 % (ref 11.5–15.5)
WBC: 13.4 10*3/uL — ABNORMAL HIGH (ref 4.0–10.5)
nRBC: 0 % (ref 0.0–0.2)

## 2019-02-16 LAB — COMPREHENSIVE METABOLIC PANEL
ALT: 52 U/L — ABNORMAL HIGH (ref 0–44)
AST: 84 U/L — ABNORMAL HIGH (ref 15–41)
Albumin: 3.1 g/dL — ABNORMAL LOW (ref 3.5–5.0)
Alkaline Phosphatase: 59 U/L (ref 38–126)
Anion gap: 13 (ref 5–15)
BUN: 42 mg/dL — ABNORMAL HIGH (ref 8–23)
CO2: 21 mmol/L — ABNORMAL LOW (ref 22–32)
Calcium: 9.1 mg/dL (ref 8.9–10.3)
Chloride: 106 mmol/L (ref 98–111)
Creatinine, Ser: 2.04 mg/dL — ABNORMAL HIGH (ref 0.61–1.24)
GFR calc Af Amer: 37 mL/min — ABNORMAL LOW (ref >60)
GFR calc non Af Amer: 32 mL/min — ABNORMAL LOW (ref >60)
Glucose, Bld: 149 mg/dL — ABNORMAL HIGH (ref 70–99)
Potassium: 3.7 mmol/L (ref 3.5–5.1)
Sodium: 140 mmol/L (ref 135–145)
Total Bilirubin: 1.2 mg/dL (ref 0.3–1.2)
Total Protein: 6.9 g/dL (ref 6.5–8.1)

## 2019-02-16 LAB — PROCALCITONIN: Procalcitonin: 8.93 ng/mL

## 2019-02-16 LAB — HEMOGLOBIN A1C
Hgb A1c MFr Bld: 5.7 % — ABNORMAL HIGH (ref 4.8–5.6)
Mean Plasma Glucose: 116.89 mg/dL

## 2019-02-16 LAB — LACTIC ACID, PLASMA: Lactic Acid, Venous: 1.3 mmol/L (ref 0.5–1.9)

## 2019-02-16 LAB — LIPASE, BLOOD: Lipase: 21 U/L (ref 11–51)

## 2019-02-16 LAB — SARS CORONAVIRUS 2 BY RT PCR (HOSPITAL ORDER, PERFORMED IN ~~LOC~~ HOSPITAL LAB): SARS Coronavirus 2: NEGATIVE

## 2019-02-16 MED ORDER — ACETAMINOPHEN 325 MG PO TABS: 650.0000 mg | ORAL_TABLET | Freq: Four times a day (QID) | ORAL | Status: DC | PRN

## 2019-02-16 MED ORDER — SODIUM CHLORIDE 0.9 % IV SOLN
1.0000 g | INTRAVENOUS | Status: DC
  Administered 2019-02-16: 1 g via INTRAVENOUS
  Filled 2019-02-16 (×2): qty 10

## 2019-02-16 MED ORDER — ACETAMINOPHEN 650 MG RE SUPP: 650.0000 mg | Freq: Four times a day (QID) | RECTAL | Status: DC | PRN

## 2019-02-16 MED ORDER — LACTATED RINGERS IV SOLN
INTRAVENOUS | Status: DC
  Administered 2019-02-16 – 2019-02-17 (×2): via INTRAVENOUS

## 2019-02-16 MED ORDER — ENOXAPARIN SODIUM 40 MG/0.4ML ~~LOC~~ SOLN
40.0000 mg | SUBCUTANEOUS | Status: DC
  Administered 2019-02-16: 40 mg via SUBCUTANEOUS
  Filled 2019-02-16: qty 0.4

## 2019-02-16 MED ORDER — POLYETHYLENE GLYCOL 3350 17 G PO PACK: 17.0000 g | PACK | Freq: Every day | ORAL | Status: DC | PRN

## 2019-02-16 MED ORDER — ONDANSETRON HCL 4 MG/2ML IJ SOLN: 4.0000 mg | Freq: Four times a day (QID) | INTRAMUSCULAR | Status: DC | PRN

## 2019-02-16 MED ORDER — SODIUM CHLORIDE 0.9% FLUSH
3.0000 mL | Freq: Two times a day (BID) | INTRAVENOUS | Status: DC
  Administered 2019-02-17: 3 mL via INTRAVENOUS

## 2019-02-16 MED ORDER — ACETAMINOPHEN 500 MG PO TABS
1000.0000 mg | ORAL_TABLET | Freq: Once | ORAL | Status: AC
  Administered 2019-02-16: 1000 mg via ORAL
  Filled 2019-02-16: qty 2

## 2019-02-16 MED ORDER — LACTATED RINGERS IV BOLUS
1000.0000 mL | Freq: Once | INTRAVENOUS | Status: AC
  Administered 2019-02-16: 1000 mL via INTRAVENOUS

## 2019-02-16 MED ORDER — DOCUSATE SODIUM 100 MG PO CAPS
100.0000 mg | ORAL_CAPSULE | Freq: Two times a day (BID) | ORAL | Status: DC
  Administered 2019-02-16: 100 mg via ORAL
  Filled 2019-02-16: qty 1

## 2019-02-16 MED ORDER — ONDANSETRON HCL 4 MG PO TABS: 4.0000 mg | ORAL_TABLET | Freq: Four times a day (QID) | ORAL | Status: DC | PRN

## 2019-02-16 NOTE — ED Notes (Signed)
Nurse Navigator communication: Pt gladly accepted to have me contact his family friend. Thankfully his friend is able to come and go to his car to retrieve his bags for his admission. The writer will meet Edward Wells out front and receive his belongings to bring to the patient.

## 2019-02-16 NOTE — ED Triage Notes (Signed)
Saturday (02/12/19) pt stated he began urinating frequently, up until he arrived to ED pt endorses dysuria, "beet color," & has a temp of 101.1.

## 2019-02-16 NOTE — ED Notes (Signed)
Attempted to call report to 2W 

## 2019-02-16 NOTE — ED Provider Notes (Signed)
[facility] EMERGENCY DEPARTMENT Provider Note   CSN: [id number] Arrival date & time: 02/16/19  1157    History   Chief Complaint Chief Complaint  Patient presents with  . Urinary Tract Infection    HPI Edward Wells is a 73 y.o. male. With a pmhx of HTN who presents with dysuria for 4 days and general malaise. Now with hematuria Endorses 10 episodes of emesis. Denies fevers, cough, congestion, CP, SOB.     Illness  Severity:  Severe Onset quality:  Gradual Duration:  4 days Timing:  Constant Progression:  Worsening Chronicity:  New Associated symptoms: fatigue, nausea and vomiting   Associated symptoms: no abdominal pain, no chest pain, no cough, no diarrhea, no ear pain, no fever, no headaches, no rash, no shortness of breath and no sore throat     Past Medical History:  Diagnosis Date  . Acute gastric ulcer with hemorrhage 07/30/2014  . Hypertension     Patient Active Problem List   Diagnosis Date Noted  . Sepsis secondary to UTI ([facility]) 02/16/2019  . Acute renal failure (ARF) ([facility]) 02/16/2019  . Hyperglycemia 02/16/2019  . Acute gastric ulcer with hemorrhage 07/30/2014  . Essential hypertension 07/30/2014  . Normocytic anemia due to blood loss 07/30/2014    Past Surgical History:  Procedure Laterality Date  . COLONOSCOPY    . ESOPHAGOGASTRODUODENOSCOPY N/A 07/31/2014   Procedure: ESOPHAGOGASTRODUODENOSCOPY (EGD);  Surgeon: Gatha Mayer, MD;  Location: [facility] ENDOSCOPY;  Service: Endoscopy;  Laterality: N/A;  . ganglion cyst wrist  1971   right, x 2.  . HAMMER TOE SURGERY  1984   bilateral  . KNEE ARTHROSCOPY     left, 2004; right, 2001        Home Medications    Prior to Admission medications   Medication Sig Start Date End Date Taking? Authorizing Provider  Coenzyme Q10 (CO Q 10) 100 MG CAPS Take 100 mg by mouth daily.   Yes [provider]  losartan (COZAAR) 25 MG tablet Take 25 mg by mouth daily. 02/06/19  Yes [provider]  Multiple Vitamin (MULTIVITAMIN) tablet Take 1 tablet by mouth daily.   Yes [provider]    Family History Family History  Problem Relation Age of Onset  . Hypertension Mother   . Colon cancer Neg Hx   . Stomach cancer Neg Hx   . Esophageal cancer Neg Hx   . Rectal cancer Neg Hx     Social History Social History   Tobacco Use  . Smoking status: Former Smoker    Start date: 10/05/2018  . Smokeless tobacco: Never Used  Substance Use Topics  . Alcohol use: Not Currently    Comment: h/o heavy use; last drank 10/04/2018  . Drug use: No     Allergies   Furosemide   Review of Systems Review of Systems  Constitutional: Positive for activity change, appetite change and fatigue. Negative for chills and fever.  HENT: Negative for ear pain and sore throat.   Eyes: Negative for pain and visual disturbance.  Respiratory: Negative for cough, chest tightness and shortness of breath.   Cardiovascular: Negative for chest pain and palpitations.  Gastrointestinal: Positive for nausea and vomiting. Negative for abdominal pain, blood in stool, constipation and diarrhea.  Genitourinary: Positive for difficulty urinating, dysuria, enuresis, flank pain, frequency and hematuria. Negative for discharge and testicular pain.  Musculoskeletal: Negative for arthralgias and back pain.  Skin: Negative for color change and rash.  Neurological: Negative  for seizures, syncope and headaches.  All other systems reviewed and are negative.    Physical Exam Updated Vital Signs BP (!) 117/100   Pulse (!) 101   Temp 98.8 F (37.1 C) (Oral)   Resp 18   Ht 6' (1.829 m)   Wt 105.2 kg   SpO2 99%   BMI 31.46 kg/m   Physical Exam Vitals signs and nursing note reviewed.  Constitutional:      Appearance: He is well-developed. He is ill-appearing.  HENT:     Head: Normocephalic and atraumatic.  Eyes:     Conjunctiva/sclera: Conjunctivae normal.  Neck:     Musculoskeletal:  Neck supple.  Cardiovascular:     Rate and Rhythm: Regular rhythm. Tachycardia present.     Heart sounds: No murmur.  Pulmonary:     Effort: No respiratory distress.     Comments: tachypneic Abdominal:     Palpations: Abdomen is soft.     Tenderness: There is no abdominal tenderness. There is right CVA tenderness and left CVA tenderness.  Musculoskeletal:     Right lower leg: No edema.     Left lower leg: No edema.  Skin:    General: Skin is warm and dry.  Neurological:     Mental Status: He is alert and oriented to person, place, and time.      ED Treatments / Results  Labs (all labs ordered are listed, but only abnormal results are displayed) Labs Reviewed  COMPREHENSIVE METABOLIC PANEL - Abnormal; Notable for the following components:      Result Value   CO2 21 (*)    Glucose, Bld 149 (*)    BUN 42 (*)    Creatinine, Ser 2.04 (*)    Albumin 3.1 (*)    AST 84 (*)    ALT 52 (*)    GFR calc non Af Amer 32 (*)    GFR calc Af Amer 37 (*)    All other components within normal limits  CBC WITH DIFFERENTIAL/PLATELET - Abnormal; Notable for the following components:   WBC 13.4 (*)    Platelets 119 (*)    Neutro Abs 11.2 (*)    Abs Immature Granulocytes 0.42 (*)    All other components within normal limits  URINALYSIS, ROUTINE W REFLEX MICROSCOPIC - Abnormal; Notable for the following components:   Color, Urine AMBER (*)    APPearance CLOUDY (*)    Hgb urine dipstick LARGE (*)    Protein, ur 100 (*)    Leukocytes,Ua LARGE (*)    RBC / HPF >50 (*)    WBC, UA >50 (*)    Bacteria, UA MANY (*)    All other components within normal limits  HEMOGLOBIN A1C - Abnormal; Notable for the following components:   Hgb A1c MFr Bld 5.7 (*)    All other components within normal limits  SARS CORONAVIRUS 2 (HOSPITAL ORDER, [facility])  CULTURE, BLOOD (ROUTINE X 2)  CULTURE, BLOOD (ROUTINE X 2)  URINE CULTURE  LACTIC ACID, PLASMA  LIPASE, BLOOD   PROCALCITONIN  BASIC METABOLIC PANEL  CBC  HIV ANTIBODY (ROUTINE TESTING W REFLEX)    EKG EKG Interpretation  Date/Time:  Thursday Feb 16 2019 12:45:31 EDT Ventricular Rate:  168 PR Interval:    QRS Duration: 129 QT Interval:  273 QTC Calculation: 457 R Axis:     Text Interpretation:  Sinus tachycardia Non-specific ST-t changes Confirmed by Pattricia Boss [id number]) on 02/16/2019 2:34:50 PM  Radiology Dg Chest Port 1 View  Result Date: 02/16/2019 CLINICAL DATA:  Dysuria and fevers EXAM: PORTABLE CHEST 1 VIEW COMPARISON:  02/20/2010 FINDINGS: Cardiac shadows within normal limits. Lungs are well aerated. Mild right basilar atelectasis is seen with small effusion. Platelike atelectasis in the left base is noted. No bony abnormality is seen. IMPRESSION: Bibasilar atelectatic changes with small right pleural effusion. Electronically Signed   By: Inez Catalina M.D.   On: 02/16/2019 12:49    Procedures Procedures (including critical care time)  Medications Ordered in ED Medications  cefTRIAXone (ROCEPHIN) 1 g in sodium chloride 0.9 % 100 mL IVPB (0 g Intravenous Stopped 02/16/19 1319)  lactated ringers infusion ( Intravenous Bolus from Bag 02/16/19 1642)  enoxaparin (LOVENOX) injection 40 mg (has no administration in time range)  sodium chloride flush (NS) 0.9 % injection 3 mL (has no administration in time range)  acetaminophen (TYLENOL) tablet 650 mg (has no administration in time range)    Or  acetaminophen (TYLENOL) suppository 650 mg (has no administration in time range)  docusate sodium (COLACE) capsule 100 mg (has no administration in time range)  polyethylene glycol (MIRALAX / GLYCOLAX) packet 17 g (has no administration in time range)  ondansetron (ZOFRAN) tablet 4 mg (has no administration in time range)    Or  ondansetron (ZOFRAN) injection 4 mg (has no administration in time range)  acetaminophen (TYLENOL) tablet 1,000 mg (1,000 mg Oral Given 02/16/19 1255)  lactated ringers  bolus 1,000 mL (1,000 mLs Intravenous Bolus from Bag 02/16/19 1253)     Initial Impression / Assessment and Plan / ED Course  I have reviewed the triage vital signs and the nursing notes.  Pertinent labs & imaging results that were available during my care of the patient were reviewed by me and considered in my medical decision making (see chart for details).       Edward Wells is a 73 y.o. male. With a pmhx of HTN who presents with dysuria for 4 days and general malaise. Now with hematuria Endorses 10 episodes of emesis. Denies fevers, cough, congestion, CP, SOB.    History and exam concerning for sepsis likely secondary to a urinary source  Multiple labs drawn on the patient. Patient with vital sign abnormalities including fever, tachycardic and tachypneic and lab abnormalities, c/w sepsis. Antibiotics started on the patient. Fluid resuscitation started on the patient. Will admit to hospitalist for continued monitoring and workup.        Final Clinical Impressions(s) / ED Diagnoses   Final diagnoses:  Sepsis, due to unspecified organism, unspecified whether acute organ dysfunction present [facility])  Dysuria  Urinary tract infection with hematuria, site unspecified    ED Discharge Orders    None       Doneta Public, MD 02/16/19 2011    Pattricia Boss, MD 02/17/19 1056

## 2019-02-16 NOTE — H&P (Signed)
History and Physical    [name] Age XNA:[id number] DOB: 19-Sep-1946 DOA: 02/16/2019  PCP: Luis Abed Story, [place]; [employer] Consultants:  None Patient coming from:  Home - lives alone; NOK: Niece in [place]  Chief Complaint: Urinary symptoms  HPI: Edward Wells is a 73 y.o. male with medical history significant of HTN and GI bleed (2015) presenting with urinary symptoms.  Last Friday, he was mowing his grass and he noticed having to void more frequently and he had incontinence.  Overnight, he had ongoing symptoms.  Over the next few days, he noticed some n/v and feeling sick.  His urine changed to dark color and then pink and then "beet color."  He was going to the doctor a few days ago but didn't feel up to it.  He finally decided to come to the hospital.  +dysuria initially but not over the last few days.  He did not notice a fever. He has had tired muscles.  No sick contacts.   ED Course:  Urosepsis, AKI.  4-5 days of dysuria, polyuria, incontinence, suprapubic pain with radiation to B flanks.  Worse today.  Fever to 103, HR 120s (brief SVT to 170s, converted), improved with IVF.  Normal lactate.  Creatinine 2.0, up for 0.8.  UA pending (I/O ordered), given Rocephin.  COVID pending.  Review of Systems: As per HPI; otherwise review of systems reviewed and negative.   Ambulatory Status:  Ambulates without assistance  Past Medical History:  Diagnosis Date   Acute gastric ulcer with hemorrhage 07/30/2014   Hypertension     Past Surgical History:  Procedure Laterality Date   COLONOSCOPY     ESOPHAGOGASTRODUODENOSCOPY N/A 07/31/2014   Procedure: ESOPHAGOGASTRODUODENOSCOPY (EGD);  Surgeon: Gatha Mayer, MD;  Location: [facility] ENDOSCOPY;  Service: Endoscopy;  Laterality: N/A;   ganglion cyst wrist  1971   right, x 2.   [place]   bilateral   KNEE ARTHROSCOPY     left, 2004; right, 2001    Social History   Socioeconomic History   Marital status: Divorced    Spouse  name: Not on file   Number of children: Not on file   Years of education: Not on file   Highest education level: Not on file  Occupational History   Occupation: retired  Scientist, product/process development strain: Not on file   Food insecurity:    Worry: Not on file    Inability: Not on file   Transportation needs:    Medical: Not on file    Non-medical: Not on file  Tobacco Use   Smoking status: Former Smoker    Start date: 10/05/2018   Smokeless tobacco: Never Used  Substance and Sexual Activity   Alcohol use: Not Currently    Comment: h/o heavy use; last drank 10/04/2018   Drug use: No   Sexual activity: Not on file  Lifestyle   Physical activity:    Days per week: Not on file    Minutes per session: Not on file   Stress: Not on file  Relationships   Social connections:    Talks on phone: Not on file    Gets together: Not on file    Attends religious service: Not on file    Active member of club or organization: Not on file    Attends meetings of clubs or organizations: Not on file    Relationship status: Not on file   Intimate partner violence:    Fear of  current or ex partner: Not on file    Emotionally abused: Not on file    Physically abused: Not on file    Forced sexual activity: Not on file  Other Topics Concern   Not on file  Social History Narrative   Retired Set designer.     Moved to [place] ~ 2007.       Would drink 750 ml vodka over the course of 2 weeks.  Quit etoh and cigs September 2015.     Allergies  Allergen Reactions   Furosemide     Muscle cramps    Family History  Problem Relation Age of Onset   Hypertension Mother    Colon cancer Neg Hx    Stomach cancer Neg Hx    Esophageal cancer Neg Hx    Rectal cancer Neg Hx     Prior to Admission medications   Medication Sig Start Date End Date Taking? Authorizing Provider  amLODipine (NORVASC) 10 MG tablet Take 10 mg by mouth daily.    [provider]  amoxicillin-clarithromycin-lansoprazole [facility]) combo pack Take by mouth 2 (two) times daily. Follow package directions. 11/16/14   Inda Castle, MD  cholecalciferol (VITAMIN D) 1000 UNITS tablet Take 1,000 Units by mouth daily.    [provider]  Coenzyme Q10 (CO Q 10) 100 MG CAPS Take 100 mg by mouth daily.    [provider]  pantoprazole (PROTONIX) 40 MG tablet TAKE 1 TABLET BY MOUTH  DAILY BEFORE BREAKFAST 03/26/15   Inda Castle, MD    Physical Exam: Vitals:   02/16/19 1545 02/16/19 1600 02/16/19 1615 02/16/19 1630  BP: 112/74 109/60 120/76 100/62  Pulse:   98 94  Resp: (!) 25 (!) 22 (!) 25 (!) 24  Temp:      TempSrc:      SpO2:   98% 100%  Weight:      Height:          General:  Appears calm and comfortable and is NAD  Eyes:  PERRL, EOMI, normal lids, iris  ENT:  grossly normal hearing, lips & tongue, mmm; appropriate dentition  Neck:  no LAD, masses or thyromegaly; no carotid bruits  Cardiovascular:  RRR, no m/r/g. No LE edema.   Respiratory:   CTA bilaterally with no wheezes/rales/rhonchi.  Normal respiratory effort.  Abdomen:  soft, NT, ND, NABS  Back:   normal alignment, no CVAT  Skin:  no rash or induration seen on limited exam  Musculoskeletal:  grossly normal tone BUE/BLE, good ROM, no bony abnormality  Lower extremity:  No LE edema.  Limited foot exam with no ulcerations.  2+ distal pulses.  Psychiatric:  grossly normal mood and affect, speech fluent and appropriate, AOx3  Neurologic:  CN 2-12 grossly intact, moves all extremities in coordinated fashion, sensation intact    Radiological Exams on Admission: Dg Chest Port 1 View  Result Date: 02/16/2019 CLINICAL DATA:  Dysuria and fevers EXAM: PORTABLE CHEST 1 VIEW COMPARISON:  02/20/2010 FINDINGS: Cardiac shadows within normal limits. Lungs are well aerated. Mild right basilar atelectasis is seen with small effusion. Platelike atelectasis in the left base is  noted. No bony abnormality is seen. IMPRESSION: Bibasilar atelectatic changes with small right pleural effusion. Electronically Signed   By: Inez Catalina M.D.   On: 02/16/2019 12:49    EKG: Independently reviewed.   1209 - Sinus tachycardia with rate 118; nonspecific ST changes with no evidence of acute ischemia 1245 - Sinus  tachycardia with rate 168; nonspecific ST changes that are likely rate-related 1441 - NSR with rate 97; nonspecific ST changes with no evidence of acute ischemia   Labs on Admission: I have personally reviewed the available labs and imaging studies at the time of the admission.  Pertinent labs:   CO2 21 Glucose 149 BUN 42/Creatinine 2.04/GFR 32 Albumin 3.1 AST 84/ALT 52 Lactate 1.3 WBC 13.4 Platelets 119 COVID negative Blood cultures pending UA: large Hgb, large LE, negative nitrite, 100 protein, many bacteria, TNTC WBC and RBC Urine culture pending   Assessment/Plan Principal Problem:   Sepsis secondary to UTI [facility]) Active Problems:   Essential hypertension   Acute renal failure (ARF) (HCC)   Hyperglycemia   Sepsis from UTI -SIRS criteria in this patient includes: Leukocytosis, fever, tachycardia, tachypnea  -Patient has evidence of acute organ failure with acute renal failure -While awaiting blood cultures, this appears to be a preseptic condition. -Sepsis protocol initiated -Suspected source is UTI -Blood and urine cultures pending -Will place in observation status with telemetry and continue to monitor -Treat with IV Rocephin for presumed urinary source -Will order sepsis protocol procalcitonin level.  Antibiotics would not be indicated for PCT <0.1 and probably should not be used for < 0.25.  >0.5 indicates infection and >>0.5 indicates more serious disease.  As the procalcitonin level normalizes, it will be reasonable to consider de-escalation of antibiotic coverage.  Acute renal failure -While not a good comparison, he did have normal renal  function with last labs - but in 2015 -Regardless, he appears to have acute renal failure associated with sepsis -He was given a 1L bolus in the ER and will continue with MIVF -Will recheck BMP in AM  HTN -Hold Cozaar due to renal failure as well as borderline low BP -Consider restarting tomorrow as BP normalizes  Hyperglycemia -May be stress response -Will follow with fasting AM labs and check A1c    Note: This patient has been tested and is negative for the novel coronavirus COVID-19.  DVT prophylaxis:  Lovenox Code Status:  DNR Family Communication: None present; he did not request that I contact his family member to discuss this hospitalization  Disposition Plan:  Home once clinically improved Consults called: None  Admission status: It is my clinical opinion that referral for OBSERVATION is reasonable and necessary in this patient based on the above information provided. The aforementioned taken together are felt to place the patient at high risk for further clinical deterioration. However it is anticipated that the patient may be medically stable for discharge from the hospital within 24 to 48 hours.     Karmen Bongo MD [facility]   How to contact the [facility] Attending or Consulting provider [place] or covering provider during after hours [place], for this patient?  1. Check the care team in [facility] and look for a) attending/consulting [facility] provider listed and b) the [facility] team listed 2. Log into [email or web address] and use [facility]'s universal password to access. If you do not have the password, please contact the hospital operator. 3. Locate the [facility] provider you are looking for under [facility] and page to a number that you can be directly reached. 4. If you still have difficulty reaching the provider, please page the [facility] (Director on Call) for the Hospitalists listed on amion for assistance.   02/16/2019, 4:41 PM

## 2019-02-17 DIAGNOSIS — N390 Urinary tract infection, site not specified: Secondary | ICD-10-CM | POA: Diagnosis not present

## 2019-02-17 DIAGNOSIS — A419 Sepsis, unspecified organism: Secondary | ICD-10-CM | POA: Diagnosis not present

## 2019-02-17 LAB — BLOOD CULTURE ID PANEL (REFLEXED)

## 2019-02-17 LAB — CBC
HCT: 39.4 % (ref 39.0–52.0)
Hemoglobin: 13.1 g/dL (ref 13.0–17.0)
MCH: 27.6 pg (ref 26.0–34.0)
MCHC: 33.2 g/dL (ref 30.0–36.0)
MCV: 82.9 fL (ref 80.0–100.0)
Platelets: 123 10*3/uL — ABNORMAL LOW (ref 150–400)
RBC: 4.75 MIL/uL (ref 4.22–5.81)
RDW: 13 % (ref 11.5–15.5)
WBC: 10.6 10*3/uL — ABNORMAL HIGH (ref 4.0–10.5)
nRBC: 0 % (ref 0.0–0.2)

## 2019-02-17 LAB — BASIC METABOLIC PANEL
Anion gap: 9 (ref 5–15)
BUN: 34 mg/dL — ABNORMAL HIGH (ref 8–23)
CO2: 24 mmol/L (ref 22–32)
Calcium: 8.4 mg/dL — ABNORMAL LOW (ref 8.9–10.3)
Chloride: 103 mmol/L (ref 98–111)
Creatinine, Ser: 1.6 mg/dL — ABNORMAL HIGH (ref 0.61–1.24)
GFR calc Af Amer: 50 mL/min — ABNORMAL LOW (ref >60)
GFR calc non Af Amer: 43 mL/min — ABNORMAL LOW (ref >60)
Glucose, Bld: 195 mg/dL — ABNORMAL HIGH (ref 70–99)
Potassium: 3.3 mmol/L — ABNORMAL LOW (ref 3.5–5.1)
Sodium: 136 mmol/L (ref 135–145)

## 2019-02-17 LAB — HIV ANTIBODY (ROUTINE TESTING W REFLEX): HIV Screen 4th Generation wRfx: NONREACTIVE

## 2019-02-17 MED ORDER — AMOXICILLIN-POT CLAVULANATE 875-125 MG PO TABS: 1.0000 | ORAL_TABLET | Freq: Two times a day (BID) | ORAL | Status: DC

## 2019-02-17 MED ORDER — AMOXICILLIN-POT CLAVULANATE 875-125 MG PO TABS: 1.0000 | ORAL_TABLET | Freq: Two times a day (BID) | ORAL | 0 refills | Status: AC

## 2019-02-17 MED ORDER — SODIUM CHLORIDE 0.9 % IV SOLN
2.0000 g | INTRAVENOUS | Status: DC
  Administered 2019-02-17: 2 g via INTRAVENOUS
  Filled 2019-02-17: qty 20

## 2019-02-17 NOTE — Discharge Summary (Signed)
. Physician Discharge Summary  Edward Wells VZD:[id number] DOB: 1946/09/08 DOA: 02/16/2019  PCP: Elizabeth Palau, MD (Inactive)  Admit date: 02/16/2019 Discharge date: 02/17/2019  Admitted From: Home Disposition:  Discharged to home  Recommendations for Outpatient Follow-up:  1. Follow up with PCP in 1-2 weeks 2. Please obtain BMP/CBC in one week  Discharge Condition: Stable  CODE STATUS: DNR   Brief/Interim Summary: Edward Wells is a 73 y.o. male with medical history significant of HTN and GI bleed (2015) presenting with urinary symptoms.  Last Friday, he was mowing his grass and he noticed having to void more frequently and he had incontinence.  Overnight, he had ongoing symptoms.  Over the next few days, he noticed some n/v and feeling sick.  His urine changed to dark color and then pink and then "beet color."  He was going to the doctor a few days ago but didn't feel up to it.  He finally decided to come to the hospital.  +dysuria initially but not over the last few days.  He did not notice a fever. He has had tired muscles.  No sick contacts.  Sepsis secondary to UTI     - sepsis qualifiers: WBC 13.4, AKI w/ SCr 2.04, Temp 103, HR 114, RR 25     - e coli UTI and bacteremia (sensativities pending)     - VSS, he's bright and conversant today; he looks good and says he feels great, WBCs down to 10.6 and he is afebrile     - 2 doses of rocephin and good response; will transition to augmentin 875 BID x 9 days for bacteremia/UTI     - has PCP f/u scheduled for 5/29; if he can't get in earlier, that will be acceptable     - will need follow up BMP and CBC  E coli bacteremia/UTI     - see above  AKI     - baseline unknown     - SCr improving with fluids     - Losartan held; but can resume 5/16     - instructed on maintaining good hydration  HTN     - hold losartan today, but can resume tomorrow  Continue Augmentin for 9 more days. Follow up with PCP. Sign/symptoms for return to  ED or call to PCP have been reviewed. He voices understanding. He agrees with above plan.   Discharge Diagnoses:  Principal Problem:   Sepsis secondary to UTI [facility]) Active Problems:   Essential hypertension   Acute renal failure (ARF) ([place])   Hyperglycemia    Discharge Instructions   Allergies as of 02/17/2019      Reactions   Furosemide    Muscle cramps      Medication List    TAKE these medications   amoxicillin-clavulanate 875-125 MG tablet Commonly known as:  AUGMENTIN Take 1 tablet by mouth every 12 (twelve) hours for 9 days. Start taking on:  Feb 18, 2019   Co Q 10 100 MG Caps Take 100 mg by mouth daily.   losartan 25 MG tablet Commonly known as:  COZAAR Take 25 mg by mouth daily.   multivitamin tablet Take 1 tablet by mouth daily.       Allergies  Allergen Reactions  . Furosemide     Muscle cramps    Consultations:  None    Procedures/Studies: Dg Chest Port 1 View  Result Date: 02/16/2019 CLINICAL DATA:  Dysuria and fevers EXAM: PORTABLE CHEST 1 VIEW COMPARISON:  02/20/2010  FINDINGS: Cardiac shadows within normal limits. Lungs are well aerated. Mild right basilar atelectasis is seen with small effusion. Platelike atelectasis in the left base is noted. No bony abnormality is seen. IMPRESSION: Bibasilar atelectatic changes with small right pleural effusion. Electronically Signed   By: Inez Catalina M.D.   On: 02/16/2019 12:49      Subjective: "I feel good, doc."  Discharge Exam: Vitals:   02/16/19 2317 02/17/19 0803  BP: 138/75 (!) 140/95  Pulse: 91 (!) 104  Resp: 19 19  Temp: 98.8 F (37.1 C) 99.3 F (37.4 C)  SpO2: 99% 99%   Vitals:   02/16/19 1630 02/16/19 1722 02/16/19 2317 02/17/19 0803  BP: 100/62 (!) 117/100 138/75 (!) 140/95  Pulse: 94 (!) 101 91 (!) 104  Resp: (!) 24 18 19 19   Temp:  98.8 F (37.1 C) 98.8 F (37.1 C) 99.3 F (37.4 C)  TempSrc:  Oral Oral Oral  SpO2: 100% 99% 99% 99%  Weight:      Height:        General: 73 y.o. male resting on side of bed in NAD Cardiovascular: RRR, +S1, S2, no m/g/r, equal pulses throughout Respiratory: CTABL, no w/r/r, normal WOB GI: BS+, NDNT, no masses noted, no organomegaly noted MSK: No e/c/c Skin: No rashes, bruises, ulcerations noted Neuro: A&O x 3, no focal deficits Psyc: Appropriate interaction and affect, bright, conversant, cooperative   The results of significant diagnostics from this hospitalization (including imaging, microbiology, ancillary and laboratory) are listed below for reference.     Microbiology: Recent Results (from the past 240 hour(s))  Blood Culture (routine x 2)     Status: None (Preliminary result)   Collection Time: 02/16/19 12:25 PM  Result Value Ref Range Status   Specimen Description BLOOD SITE NOT SPECIFIED  Final   Special Requests   Final    BOTTLES DRAWN AEROBIC AND ANAEROBIC Blood Culture adequate volume   Culture  Setup Time   Final    IN BOTH AEROBIC AND ANAEROBIC BOTTLES GRAM NEGATIVE RODS CRITICAL VALUE NOTED.  VALUE IS CONSISTENT WITH PREVIOUSLY REPORTED AND CALLED VALUE. Performed at [facility], [place] [address] [place], [place] [address]    Culture GRAM NEGATIVE RODS  Final   Report Status PENDING  Incomplete  Blood Culture (routine x 2)     Status: None (Preliminary result)   Collection Time: 02/16/19 12:33 PM  Result Value Ref Range Status   Specimen Description BLOOD RIGHT ANTECUBITAL  Final   Special Requests   Final    BOTTLES DRAWN AEROBIC AND ANAEROBIC Blood Culture results may not be optimal due to an excessive volume of blood received in culture bottles   Culture  Setup Time   Final    IN BOTH AEROBIC AND ANAEROBIC BOTTLES GRAM NEGATIVE RODS CRITICAL RESULT CALLED TO, READ BACK BY AND VERIFIED WITHAlona Bene [facility] 02/17/19 0131 JDW Performed at [facility], [place] [address], [place], [place] [address]    Culture GRAM NEGATIVE RODS  Final   Report Status PENDING  Incomplete  Blood  Culture ID Panel (Reflexed)     Status: Abnormal   Collection Time: 02/16/19 12:33 PM  Result Value Ref Range Status   Enterococcus species NOT DETECTED NOT DETECTED Final   Listeria monocytogenes NOT DETECTED NOT DETECTED Final   Staphylococcus species NOT DETECTED NOT DETECTED Final   Staphylococcus aureus (BCID) NOT DETECTED NOT DETECTED Final   Streptococcus species NOT DETECTED NOT DETECTED Final  Streptococcus agalactiae NOT DETECTED NOT DETECTED Final   Streptococcus pneumoniae NOT DETECTED NOT DETECTED Final   Streptococcus pyogenes NOT DETECTED NOT DETECTED Final   Acinetobacter baumannii NOT DETECTED NOT DETECTED Final   Enterobacteriaceae species DETECTED (A) NOT DETECTED Final    Comment: Enterobacteriaceae represent a large family of gram-negative bacteria, not a single organism. CRITICAL RESULT CALLED TO, READ BACK BY AND VERIFIED WITH: V BRYK PHARMD 02/17/19 0131 JDW    Enterobacter cloacae complex NOT DETECTED NOT DETECTED Final   Escherichia coli DETECTED (A) NOT DETECTED Final    Comment: CRITICAL RESULT CALLED TO, READ BACK BY AND VERIFIED WITH: V BRYK PHARMD 02/17/19 0131 JDW    Klebsiella oxytoca NOT DETECTED NOT DETECTED Final   Klebsiella pneumoniae NOT DETECTED NOT DETECTED Final   Proteus species NOT DETECTED NOT DETECTED Final   Serratia marcescens NOT DETECTED NOT DETECTED Final   Carbapenem resistance NOT DETECTED NOT DETECTED Final   Haemophilus influenzae NOT DETECTED NOT DETECTED Final   Neisseria meningitidis NOT DETECTED NOT DETECTED Final   Pseudomonas aeruginosa NOT DETECTED NOT DETECTED Final   Candida albicans NOT DETECTED NOT DETECTED Final   Candida glabrata NOT DETECTED NOT DETECTED Final   Candida krusei NOT DETECTED NOT DETECTED Final   Candida parapsilosis NOT DETECTED NOT DETECTED Final   Candida tropicalis NOT DETECTED NOT DETECTED Final    Comment: Performed at [facility], [place]. [address] [place], [place] [address]  SARS  Coronavirus 2 (CEPHEID - Performed in [place] hospital lab), Hosp Order     Status: None   Collection Time: 02/16/19  2:42 PM  Result Value Ref Range Status   SARS Coronavirus 2 NEGATIVE NEGATIVE Final    Comment: (NOTE) If result is NEGATIVE SARS-CoV-2 target nucleic acids are NOT DETECTED. The SARS-CoV-2 RNA is generally detectable in upper and lower  respiratory specimens during the acute phase of infection. The lowest  concentration of SARS-CoV-2 viral copies this assay can detect is 250  copies / mL. A negative result does not preclude SARS-CoV-2 infection  and should not be used as the sole basis for treatment or other  patient management decisions.  A negative result may occur with  improper specimen collection / handling, submission of specimen other  than nasopharyngeal swab, presence of viral mutation(s) within the  areas targeted by this assay, and inadequate number of viral copies  (<250 copies / mL). A negative result must be combined with clinical  observations, patient history, and epidemiological information. If result is POSITIVE SARS-CoV-2 target nucleic acids are DETECTED. The SARS-CoV-2 RNA is generally detectable in upper and lower  respiratory specimens dur ing the acute phase of infection.  Positive  results are indicative of active infection with SARS-CoV-2.  Clinical  correlation with patient history and other diagnostic information is  necessary to determine patient infection status.  Positive results do  not rule out bacterial infection or co-infection with other viruses. If result is PRESUMPTIVE POSTIVE SARS-CoV-2 nucleic acids MAY BE PRESENT.   A presumptive positive result was obtained on the submitted specimen  and confirmed on repeat testing.  While 2019 novel coronavirus  (SARS-CoV-2) nucleic acids may be present in the submitted sample  additional confirmatory testing may be necessary for epidemiological  and / or clinical management purposes  to  differentiate between  SARS-CoV-2 and other Sarbecovirus currently known to infect humans.  If clinically indicated additional testing with an alternate test  methodology [phone]) is advised. The SARS-CoV-2 RNA  is generally  detectable in upper and lower respiratory sp ecimens during the acute  phase of infection. The expected result is Negative. Fact Sheet for Patients:  StrictlyIdeas.no Fact Sheet for Healthcare Providers: BankingDealers.co.za This test is not yet approved or cleared by the [place] FDA and has been authorized for detection and/or diagnosis of SARS-CoV-2 by FDA under an Emergency Use Authorization (EUA).  This EUA will remain in effect (meaning this test can be used) for the duration of the COVID-19 declaration under Section 564(b)(1) of the Act, 21 U.S.C. section 360bbb-3(b)(1), unless the authorization is terminated or revoked sooner. Performed at [facility], [place] [address], [place], [place] [address]   Urine culture     Status: Abnormal (Preliminary result)   Collection Time: 02/16/19  3:13 PM  Result Value Ref Range Status   Specimen Description URINE, CLEAN CATCH  Final   Special Requests NONE  Final   Culture (A)  Final    50,000 COLONIES/mL ESCHERICHIA COLI CULTURE REINCUBATED FOR BETTER GROWTH Performed at [facility], [place] [address], [place], [place] [address]    Report Status PENDING  Incomplete     Labs: BNP (last 3 results) No results for input(s): BNP in the last 8760 hours. Basic Metabolic Panel: Recent Labs  Lab 02/16/19 1228 02/17/19 0416  NA 140 136  K 3.7 3.3*  CL 106 103  CO2 21* 24  GLUCOSE 149* 195*  BUN 42* 34*  CREATININE 2.04* 1.60*  CALCIUM 9.1 8.4*   Liver Function Tests: Recent Labs  Lab 02/16/19 1228  AST 84*  ALT 52*  ALKPHOS 59  BILITOT 1.2  PROT 6.9  ALBUMIN 3.1*   Recent Labs  Lab 02/16/19 1228  LIPASE 21   No results for input(s):  AMMONIA in the last 168 hours. CBC: Recent Labs  Lab 02/16/19 1228 02/17/19 0416  WBC 13.4* 10.6*  NEUTROABS 11.2*  --   HGB 15.0 13.1  HCT 44.8 39.4  MCV 81.8 82.9  PLT 119* 123*   Cardiac Enzymes: No results for input(s): CKTOTAL, CKMB, CKMBINDEX, TROPONINI in the last 168 hours. BNP: Invalid input(s): POCBNP CBG: No results for input(s): GLUCAP in the last 168 hours. D-Dimer No results for input(s): DDIMER in the last 72 hours. Hgb A1c Recent Labs    02/16/19 1657  HGBA1C 5.7*   Lipid Profile No results for input(s): CHOL, HDL, LDLCALC, TRIG, CHOLHDL, LDLDIRECT in the last 72 hours. Thyroid function studies No results for input(s): TSH, T4TOTAL, T3FREE, THYROIDAB in the last 72 hours.  Invalid input(s): FREET3 Anemia work up No results for input(s): VITAMINB12, FOLATE, FERRITIN, TIBC, IRON, RETICCTPCT in the last 72 hours. Urinalysis    Component Value Date/Time   COLORURINE AMBER (A) 02/16/2019 1500   APPEARANCEUR CLOUDY (A) 02/16/2019 1500   LABSPEC 1.016 02/16/2019 1500   PHURINE 5.0 02/16/2019 1500   GLUCOSEU NEGATIVE 02/16/2019 1500   HGBUR LARGE (A) 02/16/2019 1500   BILIRUBINUR NEGATIVE 02/16/2019 1500   KETONESUR NEGATIVE 02/16/2019 1500   PROTEINUR 100 (A) 02/16/2019 1500   NITRITE NEGATIVE 02/16/2019 1500   LEUKOCYTESUR LARGE (A) 02/16/2019 1500   Sepsis Labs Invalid input(s): PROCALCITONIN,  WBC,  LACTICIDVEN Microbiology Recent Results (from the past 240 hour(s))  Blood Culture (routine x 2)     Status: None (Preliminary result)   Collection Time: 02/16/19 12:25 PM  Result Value Ref Range Status   Specimen Description BLOOD SITE NOT SPECIFIED  Final   Special Requests  Final    BOTTLES DRAWN AEROBIC AND ANAEROBIC Blood Culture adequate volume   Culture  Setup Time   Final    IN BOTH AEROBIC AND ANAEROBIC BOTTLES GRAM NEGATIVE RODS CRITICAL VALUE NOTED.  VALUE IS CONSISTENT WITH PREVIOUSLY REPORTED AND CALLED VALUE. Performed at [facility], [place] [address], [place], [place] [address]    Culture GRAM NEGATIVE RODS  Final   Report Status PENDING  Incomplete  Blood Culture (routine x 2)     Status: None (Preliminary result)   Collection Time: 02/16/19 12:33 PM  Result Value Ref Range Status   Specimen Description BLOOD RIGHT ANTECUBITAL  Final   Special Requests   Final    BOTTLES DRAWN AEROBIC AND ANAEROBIC Blood Culture results may not be optimal due to an excessive volume of blood received in culture bottles   Culture  Setup Time   Final    IN BOTH AEROBIC AND ANAEROBIC BOTTLES GRAM NEGATIVE RODS CRITICAL RESULT CALLED TO, READ BACK BY AND VERIFIED WITHAlona Bene [facility] 02/17/19 0131 JDW Performed at [facility], [place] [address] [place], [place] [address]    Culture GRAM NEGATIVE RODS  Final   Report Status PENDING  Incomplete  Blood Culture ID Panel (Reflexed)     Status: Abnormal   Collection Time: 02/16/19 12:33 PM  Result Value Ref Range Status   Enterococcus species NOT DETECTED NOT DETECTED Final   Listeria monocytogenes NOT DETECTED NOT DETECTED Final   Staphylococcus species NOT DETECTED NOT DETECTED Final   Staphylococcus aureus (BCID) NOT DETECTED NOT DETECTED Final   Streptococcus species NOT DETECTED NOT DETECTED Final   Streptococcus agalactiae NOT DETECTED NOT DETECTED Final   Streptococcus pneumoniae NOT DETECTED NOT DETECTED Final   Streptococcus pyogenes NOT DETECTED NOT DETECTED Final   Acinetobacter baumannii NOT DETECTED NOT DETECTED Final   Enterobacteriaceae species DETECTED (A) NOT DETECTED Final    Comment: Enterobacteriaceae represent a large family of gram-negative bacteria, not a single organism. CRITICAL RESULT CALLED TO, READ BACK BY AND VERIFIED WITH: V BRYK PHARMD 02/17/19 0131 JDW    Enterobacter cloacae complex NOT DETECTED NOT DETECTED Final   Escherichia coli DETECTED (A) NOT DETECTED Final    Comment: CRITICAL RESULT CALLED TO, READ BACK BY AND VERIFIED WITH: V  BRYK PHARMD 02/17/19 0131 JDW    Klebsiella oxytoca NOT DETECTED NOT DETECTED Final   Klebsiella pneumoniae NOT DETECTED NOT DETECTED Final   Proteus species NOT DETECTED NOT DETECTED Final   Serratia marcescens NOT DETECTED NOT DETECTED Final   Carbapenem resistance NOT DETECTED NOT DETECTED Final   Haemophilus influenzae NOT DETECTED NOT DETECTED Final   Neisseria meningitidis NOT DETECTED NOT DETECTED Final   Pseudomonas aeruginosa NOT DETECTED NOT DETECTED Final   Candida albicans NOT DETECTED NOT DETECTED Final   Candida glabrata NOT DETECTED NOT DETECTED Final   Candida krusei NOT DETECTED NOT DETECTED Final   Candida parapsilosis NOT DETECTED NOT DETECTED Final   Candida tropicalis NOT DETECTED NOT DETECTED Final    Comment: Performed at [facility], [place]. [address], [place], [place] [address]  SARS Coronavirus 2 (CEPHEID - Performed in [place] hospital lab), Hosp Order     Status: None   Collection Time: 02/16/19  2:42 PM  Result Value Ref Range Status   SARS Coronavirus 2 NEGATIVE NEGATIVE Final    Comment: (NOTE) If result is NEGATIVE SARS-CoV-2 target nucleic acids are NOT DETECTED. The SARS-CoV-2 RNA is generally detectable  in upper and lower  respiratory specimens during the acute phase of infection. The lowest  concentration of SARS-CoV-2 viral copies this assay can detect is 250  copies / mL. A negative result does not preclude SARS-CoV-2 infection  and should not be used as the sole basis for treatment or other  patient management decisions.  A negative result may occur with  improper specimen collection / handling, submission of specimen other  than nasopharyngeal swab, presence of viral mutation(s) within the  areas targeted by this assay, and inadequate number of viral copies  (<250 copies / mL). A negative result must be combined with clinical  observations, patient history, and epidemiological information. If result is POSITIVE SARS-CoV-2 target  nucleic acids are DETECTED. The SARS-CoV-2 RNA is generally detectable in upper and lower  respiratory specimens dur ing the acute phase of infection.  Positive  results are indicative of active infection with SARS-CoV-2.  Clinical  correlation with patient history and other diagnostic information is  necessary to determine patient infection status.  Positive results do  not rule out bacterial infection or co-infection with other viruses. If result is PRESUMPTIVE POSTIVE SARS-CoV-2 nucleic acids MAY BE PRESENT.   A presumptive positive result was obtained on the submitted specimen  and confirmed on repeat testing.  While 2019 novel coronavirus  (SARS-CoV-2) nucleic acids may be present in the submitted sample  additional confirmatory testing may be necessary for epidemiological  and / or clinical management purposes  to differentiate between  SARS-CoV-2 and other Sarbecovirus currently known to infect humans.  If clinically indicated additional testing with an alternate test  methodology [phone]) is advised. The SARS-CoV-2 RNA is generally  detectable in upper and lower respiratory sp ecimens during the acute  phase of infection. The expected result is Negative. Fact Sheet for Patients:  StrictlyIdeas.no Fact Sheet for Healthcare Providers: BankingDealers.co.za This test is not yet approved or cleared by the [place] FDA and has been authorized for detection and/or diagnosis of SARS-CoV-2 by FDA under an Emergency Use Authorization (EUA).  This EUA will remain in effect (meaning this test can be used) for the duration of the COVID-19 declaration under Section 564(b)(1) of the Act, 21 U.S.C. section 360bbb-3(b)(1), unless the authorization is terminated or revoked sooner. Performed at [facility], [place] [address], [place], [place] [address]   Urine culture     Status: Abnormal (Preliminary result)   Collection Time: 02/16/19   3:13 PM  Result Value Ref Range Status   Specimen Description URINE, CLEAN CATCH  Final   Special Requests NONE  Final   Culture (A)  Final    50,000 COLONIES/mL ESCHERICHIA COLI CULTURE REINCUBATED FOR BETTER GROWTH Performed at [facility], [place] [address] [place], [place] [address]    Report Status PENDING  Incomplete     Time coordinating discharge: 45 minutes spent in the coordination of discharge today.  SIGNED:   Jonnie Finner, DO  [facility] 02/17/2019, 2:58 PM Pager   If 7PM-7AM, please contact night-coverage [email or web address] Password [email or web address]

## 2019-02-17 NOTE — Progress Notes (Signed)
Daily Nursing Note  Received report from [place], [place]. Introduced self to patient who was in NAD at that time. Assessment completed, patient on mIVF- LR at 111m/hr. Otherwise awaiting urine culture results for further abx refinement.   Augmentin ordered to start tomorrow. Patient will discharge today. All patient needs met throughout shift. IV DC'd. Discharge information reviewed. All patient care needs met and questions answered.

## 2019-02-17 NOTE — Care Management Obs Status (Signed)
[facility] NOTIFICATION   Patient Details  Name: Edward Wells MRN: [id number] Date of Birth: 1945/12/07   Medicare Observation Status Notification Given:  Yes    Zenon Mayo, RN 02/17/2019, 3:21 PM

## 2019-02-17 NOTE — Progress Notes (Signed)
PHARMACY - PHYSICIAN COMMUNICATION CRITICAL VALUE ALERT - BLOOD CULTURE IDENTIFICATION (BCID)  Edward Wells is an 73 y.o. male who presented to [facility] on 02/16/2019 with a chief complaint of dysuria and fever.  Assessment:  Started on ABX for urosepsis; blood cx returns growing E.coli in 3/4 bottles.  Name of physician (or Provider) Contacted: KSchorr  Current antibiotics: Cetriaxone 1g Q24H  Changes to prescribed antibiotics recommended:  Will change ceftriaxone to 2g IV Q24H.  Results for orders placed or performed during the hospital encounter of 02/16/19  Blood Culture ID Panel (Reflexed) (Collected: 02/16/2019 12:33 PM)  Result Value Ref Range   Enterococcus species NOT DETECTED NOT DETECTED   Listeria monocytogenes NOT DETECTED NOT DETECTED   Staphylococcus species NOT DETECTED NOT DETECTED   Staphylococcus aureus (BCID) NOT DETECTED NOT DETECTED   Streptococcus species NOT DETECTED NOT DETECTED   Streptococcus agalactiae NOT DETECTED NOT DETECTED   Streptococcus pneumoniae NOT DETECTED NOT DETECTED   Streptococcus pyogenes NOT DETECTED NOT DETECTED   Acinetobacter baumannii NOT DETECTED NOT DETECTED   Enterobacteriaceae species DETECTED (A) NOT DETECTED   Enterobacter cloacae complex NOT DETECTED NOT DETECTED   Escherichia coli DETECTED (A) NOT DETECTED   Klebsiella oxytoca NOT DETECTED NOT DETECTED   Klebsiella pneumoniae NOT DETECTED NOT DETECTED   Proteus species NOT DETECTED NOT DETECTED   Serratia marcescens NOT DETECTED NOT DETECTED   Carbapenem resistance NOT DETECTED NOT DETECTED   Haemophilus influenzae NOT DETECTED NOT DETECTED   Neisseria meningitidis NOT DETECTED NOT DETECTED   Pseudomonas aeruginosa NOT DETECTED NOT DETECTED   Candida albicans NOT DETECTED NOT DETECTED   Candida glabrata NOT DETECTED NOT DETECTED   Candida krusei NOT DETECTED NOT DETECTED   Candida parapsilosis NOT DETECTED NOT DETECTED   Candida tropicalis NOT DETECTED NOT DETECTED     Wynona Neat, PharmD, BCPS  02/17/2019  1:45 AM

## 2019-02-19 LAB — CULTURE, BLOOD (ROUTINE X 2): Special Requests: ADEQUATE

## 2019-02-19 LAB — URINE CULTURE: Culture: 50000 — AB

## 2019-03-03 DIAGNOSIS — E119 Type 2 diabetes mellitus without complications: Secondary | ICD-10-CM | POA: Diagnosis not present

## 2019-04-20 DIAGNOSIS — M25561 Pain in right knee: Secondary | ICD-10-CM | POA: Diagnosis not present

## 2019-04-20 DIAGNOSIS — M25562 Pain in left knee: Secondary | ICD-10-CM | POA: Diagnosis not present

## 2019-04-20 DIAGNOSIS — M170 Bilateral primary osteoarthritis of knee: Secondary | ICD-10-CM | POA: Diagnosis not present

## 2019-04-20 DIAGNOSIS — M1712 Unilateral primary osteoarthritis, left knee: Secondary | ICD-10-CM | POA: Diagnosis not present

## 2019-04-26 DIAGNOSIS — M25561 Pain in right knee: Secondary | ICD-10-CM | POA: Diagnosis not present

## 2019-04-26 DIAGNOSIS — M1711 Unilateral primary osteoarthritis, right knee: Secondary | ICD-10-CM | POA: Diagnosis not present

## 2019-04-27 DIAGNOSIS — M25562 Pain in left knee: Secondary | ICD-10-CM | POA: Diagnosis not present

## 2019-04-27 DIAGNOSIS — M1712 Unilateral primary osteoarthritis, left knee: Secondary | ICD-10-CM | POA: Diagnosis not present

## 2019-05-03 DIAGNOSIS — M1711 Unilateral primary osteoarthritis, right knee: Secondary | ICD-10-CM | POA: Diagnosis not present

## 2019-05-03 DIAGNOSIS — M25561 Pain in right knee: Secondary | ICD-10-CM | POA: Diagnosis not present

## 2019-05-04 DIAGNOSIS — M1712 Unilateral primary osteoarthritis, left knee: Secondary | ICD-10-CM | POA: Diagnosis not present

## 2019-05-04 DIAGNOSIS — M25562 Pain in left knee: Secondary | ICD-10-CM | POA: Diagnosis not present

## 2019-05-10 DIAGNOSIS — M25562 Pain in left knee: Secondary | ICD-10-CM | POA: Diagnosis not present

## 2019-05-10 DIAGNOSIS — M25561 Pain in right knee: Secondary | ICD-10-CM | POA: Diagnosis not present

## 2019-05-10 DIAGNOSIS — M170 Bilateral primary osteoarthritis of knee: Secondary | ICD-10-CM | POA: Diagnosis not present

## 2019-05-11 DIAGNOSIS — M25562 Pain in left knee: Secondary | ICD-10-CM | POA: Diagnosis not present

## 2019-05-11 DIAGNOSIS — M1712 Unilateral primary osteoarthritis, left knee: Secondary | ICD-10-CM | POA: Diagnosis not present

## 2019-05-17 DIAGNOSIS — M25561 Pain in right knee: Secondary | ICD-10-CM | POA: Diagnosis not present

## 2019-05-17 DIAGNOSIS — M1711 Unilateral primary osteoarthritis, right knee: Secondary | ICD-10-CM | POA: Diagnosis not present

## 2019-05-18 DIAGNOSIS — M1712 Unilateral primary osteoarthritis, left knee: Secondary | ICD-10-CM | POA: Diagnosis not present

## 2019-05-18 DIAGNOSIS — M25562 Pain in left knee: Secondary | ICD-10-CM | POA: Diagnosis not present

## 2019-05-24 DIAGNOSIS — M25561 Pain in right knee: Secondary | ICD-10-CM | POA: Diagnosis not present

## 2019-05-24 DIAGNOSIS — M1711 Unilateral primary osteoarthritis, right knee: Secondary | ICD-10-CM | POA: Diagnosis not present

## 2019-06-02 DIAGNOSIS — E119 Type 2 diabetes mellitus without complications: Secondary | ICD-10-CM | POA: Diagnosis not present

## 2019-06-02 DIAGNOSIS — I10 Essential (primary) hypertension: Secondary | ICD-10-CM | POA: Diagnosis not present

## 2019-06-02 DIAGNOSIS — I119 Hypertensive heart disease without heart failure: Secondary | ICD-10-CM | POA: Diagnosis not present

## 2019-06-02 DIAGNOSIS — E6609 Other obesity due to excess calories: Secondary | ICD-10-CM | POA: Diagnosis not present

## 2019-06-02 DIAGNOSIS — Z0001 Encounter for general adult medical examination with abnormal findings: Secondary | ICD-10-CM | POA: Diagnosis not present

## 2019-06-02 DIAGNOSIS — Z720 Tobacco use: Secondary | ICD-10-CM | POA: Diagnosis not present

## 2019-11-15 DIAGNOSIS — M25561 Pain in right knee: Secondary | ICD-10-CM | POA: Diagnosis not present

## 2019-11-15 DIAGNOSIS — M1712 Unilateral primary osteoarthritis, left knee: Secondary | ICD-10-CM | POA: Diagnosis not present

## 2019-11-15 DIAGNOSIS — M25562 Pain in left knee: Secondary | ICD-10-CM | POA: Diagnosis not present

## 2019-11-15 DIAGNOSIS — M170 Bilateral primary osteoarthritis of knee: Secondary | ICD-10-CM | POA: Diagnosis not present

## 2019-11-16 DIAGNOSIS — M25561 Pain in right knee: Secondary | ICD-10-CM | POA: Diagnosis not present

## 2019-11-16 DIAGNOSIS — M1711 Unilateral primary osteoarthritis, right knee: Secondary | ICD-10-CM | POA: Diagnosis not present

## 2019-11-22 DIAGNOSIS — M1712 Unilateral primary osteoarthritis, left knee: Secondary | ICD-10-CM | POA: Diagnosis not present

## 2019-11-22 DIAGNOSIS — M25562 Pain in left knee: Secondary | ICD-10-CM | POA: Diagnosis not present

## 2019-11-24 DIAGNOSIS — M25561 Pain in right knee: Secondary | ICD-10-CM | POA: Diagnosis not present

## 2019-11-24 DIAGNOSIS — M170 Bilateral primary osteoarthritis of knee: Secondary | ICD-10-CM | POA: Diagnosis not present

## 2019-11-29 DIAGNOSIS — M25562 Pain in left knee: Secondary | ICD-10-CM | POA: Diagnosis not present

## 2019-11-29 DIAGNOSIS — M1712 Unilateral primary osteoarthritis, left knee: Secondary | ICD-10-CM | POA: Diagnosis not present

## 2019-11-30 DIAGNOSIS — M1711 Unilateral primary osteoarthritis, right knee: Secondary | ICD-10-CM | POA: Diagnosis not present

## 2019-11-30 DIAGNOSIS — M25561 Pain in right knee: Secondary | ICD-10-CM | POA: Diagnosis not present

## 2019-12-07 DIAGNOSIS — M25562 Pain in left knee: Secondary | ICD-10-CM | POA: Diagnosis not present

## 2019-12-07 DIAGNOSIS — M1712 Unilateral primary osteoarthritis, left knee: Secondary | ICD-10-CM | POA: Diagnosis not present

## 2019-12-13 DIAGNOSIS — M25561 Pain in right knee: Secondary | ICD-10-CM | POA: Diagnosis not present

## 2019-12-13 DIAGNOSIS — M1711 Unilateral primary osteoarthritis, right knee: Secondary | ICD-10-CM | POA: Diagnosis not present

## 2019-12-14 DIAGNOSIS — M25562 Pain in left knee: Secondary | ICD-10-CM | POA: Diagnosis not present

## 2019-12-14 DIAGNOSIS — M1712 Unilateral primary osteoarthritis, left knee: Secondary | ICD-10-CM | POA: Diagnosis not present

## 2019-12-27 DIAGNOSIS — M170 Bilateral primary osteoarthritis of knee: Secondary | ICD-10-CM | POA: Diagnosis not present

## 2019-12-27 DIAGNOSIS — M25561 Pain in right knee: Secondary | ICD-10-CM | POA: Diagnosis not present

## 2020-03-10 ENCOUNTER — Encounter (HOSPITAL_COMMUNITY): Payer: Self-pay | Admitting: Emergency Medicine

## 2020-03-10 ENCOUNTER — Emergency Department (HOSPITAL_COMMUNITY): Payer: Medicare Other

## 2020-03-10 ENCOUNTER — Emergency Department (HOSPITAL_COMMUNITY)
Admission: EM | Admit: 2020-03-10 | Discharge: 2020-03-11 | Disposition: A | Discharge status: Home / Self Care | Payer: Medicare Other | Attending: Emergency Medicine | Admitting: Emergency Medicine

## 2020-03-10 ENCOUNTER — Other Ambulatory Visit: Payer: Self-pay

## 2020-03-10 DIAGNOSIS — R600 Localized edema: Secondary | ICD-10-CM | POA: Diagnosis not present

## 2020-03-10 DIAGNOSIS — I10 Essential (primary) hypertension: Secondary | ICD-10-CM | POA: Insufficient documentation

## 2020-03-10 DIAGNOSIS — Z79899 Other long term (current) drug therapy: Secondary | ICD-10-CM | POA: Diagnosis not present

## 2020-03-10 DIAGNOSIS — N390 Urinary tract infection, site not specified: Secondary | ICD-10-CM | POA: Diagnosis not present

## 2020-03-10 DIAGNOSIS — N453 Epididymo-orchitis: Secondary | ICD-10-CM | POA: Diagnosis not present

## 2020-03-10 DIAGNOSIS — R112 Nausea with vomiting, unspecified: Secondary | ICD-10-CM | POA: Insufficient documentation

## 2020-03-10 DIAGNOSIS — N50819 Testicular pain, unspecified: Secondary | ICD-10-CM

## 2020-03-10 DIAGNOSIS — Z87891 Personal history of nicotine dependence: Secondary | ICD-10-CM | POA: Insufficient documentation

## 2020-03-10 DIAGNOSIS — M5489 Other dorsalgia: Secondary | ICD-10-CM | POA: Diagnosis not present

## 2020-03-10 DIAGNOSIS — R52 Pain, unspecified: Secondary | ICD-10-CM | POA: Diagnosis not present

## 2020-03-10 DIAGNOSIS — Z20822 Contact with and (suspected) exposure to covid-19: Secondary | ICD-10-CM | POA: Insufficient documentation

## 2020-03-10 DIAGNOSIS — R609 Edema, unspecified: Secondary | ICD-10-CM | POA: Diagnosis not present

## 2020-03-10 DIAGNOSIS — N50812 Left testicular pain: Secondary | ICD-10-CM | POA: Diagnosis present

## 2020-03-10 DIAGNOSIS — N451 Epididymitis: Secondary | ICD-10-CM | POA: Diagnosis not present

## 2020-03-10 NOTE — ED Triage Notes (Signed)
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  The pain has continued to get worse despite using ice and OTC medication.  Tonight it got unbearable and he had to come in. EMS reports noticeable swelling to scrotum.

## 2020-03-11 DIAGNOSIS — N451 Epididymitis: Secondary | ICD-10-CM | POA: Diagnosis not present

## 2020-03-11 DIAGNOSIS — N50812 Left testicular pain: Secondary | ICD-10-CM | POA: Diagnosis not present

## 2020-03-11 DIAGNOSIS — N390 Urinary tract infection, site not specified: Secondary | ICD-10-CM | POA: Diagnosis not present

## 2020-03-11 LAB — CBC WITH DIFFERENTIAL/PLATELET
Abs Immature Granulocytes: 0.06 10*3/uL (ref 0.00–0.07)
Basophils Absolute: 0 10*3/uL (ref 0.0–0.1)
Basophils Relative: 0 %
Eosinophils Absolute: 0 10*3/uL (ref 0.0–0.5)
Eosinophils Relative: 0 %
HCT: 44 % (ref 39.0–52.0)
Hemoglobin: 13.9 g/dL (ref 13.0–17.0)
Immature Granulocytes: 0 %
Lymphocytes Relative: 7 %
Lymphs Abs: 1 10*3/uL (ref 0.7–4.0)
MCH: 26.9 pg (ref 26.0–34.0)
MCHC: 31.6 g/dL (ref 30.0–36.0)
MCV: 85.1 fL (ref 80.0–100.0)
Monocytes Absolute: 1 10*3/uL (ref 0.1–1.0)
Monocytes Relative: 7 %
Neutro Abs: 12.8 10*3/uL — ABNORMAL HIGH (ref 1.7–7.7)
Neutrophils Relative %: 86 %
Platelets: 155 10*3/uL (ref 150–400)
RBC: 5.17 MIL/uL (ref 4.22–5.81)
RDW: 12.7 % (ref 11.5–15.5)
WBC: 14.9 10*3/uL — ABNORMAL HIGH (ref 4.0–10.5)
nRBC: 0 % (ref 0.0–0.2)

## 2020-03-11 LAB — BASIC METABOLIC PANEL
Anion gap: 10 (ref 5–15)
BUN: 12 mg/dL (ref 8–23)
CO2: 25 mmol/L (ref 22–32)
Calcium: 9.3 mg/dL (ref 8.9–10.3)
Chloride: 103 mmol/L (ref 98–111)
Creatinine, Ser: 1.1 mg/dL (ref 0.61–1.24)
GFR calc Af Amer: >60 mL/min (ref >60)
GFR calc non Af Amer: >60 mL/min (ref >60)
Glucose, Bld: 137 mg/dL — ABNORMAL HIGH (ref 70–99)
Potassium: 3.6 mmol/L (ref 3.5–5.1)
Sodium: 138 mmol/L (ref 135–145)

## 2020-03-11 LAB — URINALYSIS, ROUTINE W REFLEX MICROSCOPIC
Bilirubin Urine: NEGATIVE
Glucose, UA: NEGATIVE mg/dL
Ketones, ur: 5 mg/dL — AB
Nitrite: POSITIVE — AB
Protein, ur: 30 mg/dL — AB
Specific Gravity, Urine: 1.018 (ref 1.005–1.030)
WBC, UA: >50 WBC/hpf — ABNORMAL HIGH (ref 0–5)
pH: 5 (ref 5.0–8.0)

## 2020-03-11 LAB — PROTIME-INR
INR: 1.2 (ref 0.8–1.2)
Prothrombin Time: 14.6 seconds (ref 11.4–15.2)

## 2020-03-11 LAB — URINE CULTURE

## 2020-03-11 LAB — SARS CORONAVIRUS 2 BY RT PCR (HOSPITAL ORDER, PERFORMED IN ~~LOC~~ HOSPITAL LAB): SARS Coronavirus 2: NEGATIVE

## 2020-03-11 LAB — LACTIC ACID, PLASMA: Lactic Acid, Venous: 1.1 mmol/L (ref 0.5–1.9)

## 2020-03-11 MED ORDER — CEFDINIR 300 MG PO CAPS: 300.0000 mg | ORAL_CAPSULE | Freq: Two times a day (BID) | ORAL | 0 refills | Status: DC

## 2020-03-11 MED ORDER — SODIUM CHLORIDE 0.9 % IV SOLN
2.0000 g | Freq: Once | INTRAVENOUS | Status: AC
  Administered 2020-03-11: 2 g via INTRAVENOUS
  Filled 2020-03-11: qty 20

## 2020-03-11 MED ORDER — LACTATED RINGERS IV BOLUS
1000.0000 mL | Freq: Once | INTRAVENOUS | Status: AC
  Administered 2020-03-11: 1000 mL via INTRAVENOUS

## 2020-03-11 NOTE — Discharge Instructions (Signed)
Return to the ER if you develop high fever, nausea and vomiting or increasing pain.

## 2020-03-11 NOTE — ED Provider Notes (Signed)
[facility] EMERGENCY DEPARTMENT Provider Note   CSN: [id number] Arrival date & time: 03/10/20  2018     History Chief Complaint  Patient presents with   Back Pain   Groin Swelling    Edward Wells is a 74 y.o. male.  Patient presents to the emergency department for evaluation of testicular pain and swelling.  Patient reports that he first noticed pain in the left testicle 3 days ago.  He felt very tired around that time but no other specific symptoms.  Since then the pain has progressively worsened.  He has noticed some swelling.  No dysuria or hematuria.  He did have some nausea and vomiting a couple of days ago but this resolved.  He has not noticed any fever.        Past Medical History:  Diagnosis Date   Acute gastric ulcer with hemorrhage 07/30/2014   Hypertension     Patient Active Problem List   Diagnosis Date Noted   Sepsis secondary to UTI ([place]) 02/16/2019   Acute renal failure (ARF) ([place]) 02/16/2019   Hyperglycemia 02/16/2019   Acute gastric ulcer with hemorrhage 07/30/2014   Essential hypertension 07/30/2014   Normocytic anemia due to blood loss 07/30/2014    Past Surgical History:  Procedure Laterality Date   COLONOSCOPY     ESOPHAGOGASTRODUODENOSCOPY N/A 07/31/2014   Procedure: ESOPHAGOGASTRODUODENOSCOPY (EGD);  Surgeon: Gatha Mayer, MD;  Location: [facility] ENDOSCOPY;  Service: Endoscopy;  Laterality: N/A;   ganglion cyst wrist  1971   right, x 2.   HAMMER TOE SURGERY  1984   bilateral   KNEE ARTHROSCOPY     left, 2004; right, 2001       Family History  Problem Relation Age of Onset   Hypertension Mother    Colon cancer Neg Hx    Stomach cancer Neg Hx    Esophageal cancer Neg Hx    Rectal cancer Neg Hx     Social History   Tobacco Use   Smoking status: Former Smoker    Start date: 10/05/2018   Smokeless tobacco: Never Used  Substance Use Topics   Alcohol use: Not Currently    Comment: h/o heavy  use; last drank 10/04/2018   Drug use: No    Home Medications Prior to Admission medications   Medication Sig Start Date End Date Taking? Authorizing Provider  acetaminophen (TYLENOL) 325 MG tablet Take 975 mg by mouth daily as needed for mild pain.   Yes [provider]  Coenzyme Q10 (CO Q 10) 100 MG CAPS Take 100 mg by mouth daily.   Yes [provider]  losartan (COZAAR) 50 MG tablet Take 50 mg by mouth daily. 01/18/20  Yes [provider]  Multiple Vitamin (MULTIVITAMIN) tablet Take 1 tablet by mouth daily.   Yes [provider]  tiZANidine (ZANAFLEX) 2 MG tablet Take 1 tablet by mouth 3 (three) times daily as needed for muscle spasms. 02/15/20  Yes [provider]  cefdinir (OMNICEF) 300 MG capsule Take 1 capsule (300 mg total) by mouth 2 (two) times daily. 03/11/20   Orpah Greek, MD    Allergies    Furosemide  Review of Systems   Review of Systems  Gastrointestinal: Positive for nausea and vomiting.  Genitourinary: Positive for scrotal swelling and testicular pain. Negative for discharge.  All other systems reviewed and are negative.   Physical Exam Updated Vital Signs BP (!) 166/76    Pulse 83    Temp 99.6  F (37.6 C) (Oral)    Resp 18    Ht 6' (1.829 m)    Wt 107.5 kg    SpO2 97%    BMI 32.14 kg/m   Physical Exam Vitals and nursing note reviewed. Exam conducted with a chaperone present.  Constitutional:      General: He is not in acute distress.    Appearance: Normal appearance. He is well-developed.  HENT:     Head: Normocephalic and atraumatic.     Right Ear: Hearing normal.     Left Ear: Hearing normal.     Nose: Nose normal.  Eyes:     Conjunctiva/sclera: Conjunctivae normal.     Pupils: Pupils are equal, round, and reactive to light.  Cardiovascular:     Rate and Rhythm: Regular rhythm.     Heart sounds: S1 normal and S2 normal. No murmur. No friction rub. No gallop.   Pulmonary:     Effort: Pulmonary  effort is normal. No respiratory distress.     Breath sounds: Normal breath sounds.  Chest:     Chest wall: No tenderness.  Abdominal:     General: Bowel sounds are normal.     Palpations: Abdomen is soft.     Tenderness: There is no abdominal tenderness. There is no guarding or rebound. Negative signs include Murphy's sign and McBurney's sign.     Hernia: No hernia is present.  Genitourinary:    Penis: Uncircumcised.      Testes:        Right: Tenderness and swelling present.        Left: Tenderness and swelling present.     Epididymis:     Left: Tenderness present.  Musculoskeletal:        General: Normal range of motion.     Cervical back: Normal range of motion and neck supple.  Skin:    General: Skin is warm and dry.     Findings: No rash.  Neurological:     Mental Status: He is alert and oriented to person, place, and time.     GCS: GCS eye subscore is 4. GCS verbal subscore is 5. GCS motor subscore is 6.     Cranial Nerves: No cranial nerve deficit.     Sensory: No sensory deficit.     Coordination: Coordination normal.  Psychiatric:        Speech: Speech normal.        Behavior: Behavior normal.        Thought Content: Thought content normal.     ED Results / Procedures / Treatments   Labs (all labs ordered are listed, but only abnormal results are displayed) Labs Reviewed  CBC WITH DIFFERENTIAL/PLATELET - Abnormal; Notable for the following components:      Result Value   WBC 14.9 (*)    Neutro Abs 12.8 (*)    All other components within normal limits  BASIC METABOLIC PANEL - Abnormal; Notable for the following components:   Glucose, Bld 137 (*)    All other components within normal limits  URINALYSIS, ROUTINE W REFLEX MICROSCOPIC - Abnormal; Notable for the following components:   APPearance HAZY (*)    Hgb urine dipstick MODERATE (*)    Ketones, ur 5 (*)    Protein, ur 30 (*)    Nitrite POSITIVE (*)    Leukocytes,Ua LARGE (*)    WBC, UA >50 (*)     Bacteria, UA FEW (*)    All other components within normal limits  SARS CORONAVIRUS 2 BY RT PCR (HOSPITAL ORDER, [facility])  URINE CULTURE  PROTIME-INR  LACTIC ACID, PLASMA    EKG None  Radiology US SCROTUM W/DOPPLER  Result Date: 03/11/2020 CLINICAL DATA:  Testicular pain and swelling EXAM: SCROTAL ULTRASOUND DOPPLER ULTRASOUND OF THE TESTICLES TECHNIQUE: Complete ultrasound examination of the testicles, epididymis, and other scrotal structures was performed. Color and spectral Doppler ultrasound were also utilized to evaluate blood flow to the testicles. COMPARISON:  None. FINDINGS: Right testicle Measurements: 4.0 x 8.1 x 2.5 cm, volume 15.8 mL. No concerning testicular mass or microlithiasis. Pulsed Doppler interrogation reveals color Doppler with arteriovenous waveforms seen only about the periphery of the right testicle. No central color flow or waveforms are identified. Left testicle Measurements: 3.8 x 2.8 x 3.2 cm, volume 17.8 mL. No concerning testicular mass or microlithiasis. There is mild heterogeneity of the testicular parenchyma. Normal arteriovenous waveforms are demonstrated on pulsed Doppler ultrasound evaluation. Right epididymis: There are at least 2 small anechoic cysts in the right epididymal head measuring up to 7 mm and 4 mm in size. Otherwise normal in size and appearance albeit without demonstrable color flow. Left epididymis: Thickened, heterogeneous appearance of the left epididymis with increased color Doppler vascularity. No concerning epididymal. Hydrocele: Bilateral hydroceles, left greater than right as well as some internal septations and debris within the left hydrocele. Varicocele:  None visualized. IMPRESSION: Absence of central color flow within the right testicle is highly worrisome for testicular torsion with preserved capsular vascularity. Left epididymal and testicular heterogeneity with pronounced color Doppler flow and brisk  waveforms could raise suspicion for a concomitant epididymo-orchitis. Bilateral hydroceles present. There is complexity of the left hydrocele and given that and suspected left epididymo-orchitis is present, pyocele cannot be excluded. These results were called by telephone at the time of interpretation on 03/11/2020 at 12:26 am to provider Prateek Knipple, who verbally acknowledged these results. Electronically Signed   By: Lovena Le M.D.   On: 03/11/2020 00:27    Procedures Procedures (including critical care time)  Medications Ordered in ED Medications  cefTRIAXone (ROCEPHIN) 2 g in sodium chloride 0.9 % 100 mL IVPB (0 g Intravenous Stopped 03/11/20 0217)  lactated ringers bolus 1,000 mL (1,000 mLs Intravenous New Bag/Given 03/11/20 0217)    ED Course  I have reviewed the triage vital signs and the nursing notes.  Pertinent labs & imaging results that were available during my care of the patient were reviewed by me and considered in my medical decision making (see chart for details).    MDM Rules/Calculators/A&P                      Patient presents with complaints of pain and swelling of the left testicle.  He had a Doppler ultrasound ordered through triage.  I was called by radiology while he was still in the waiting room with positive results.  Images suggest increased flow to the left testicle consistent with epididymoorchitis.  There is, however, no flow to the right testicle very concerning for testicular torsion.  Reviewing his records reveals a history of UTI including sepsis secondary to UTI.  This occurred 1 year ago.  At that time his culture grew E. coli that was pansensitive.  Urinalysis is abnormal today, culture pending.  Will initiate Rocephin for the epididymoorchitis/UTI.  Case discussed with Dr. Alinda Money, on-call for urology.  He will see the patient in the ER to further evaluate for possible testicular torsion.  Final Clinical Impression(s) / ED Diagnoses Final diagnoses:  Lower  urinary tract infectious disease  Epididymoorchitis    Rx / DC Orders ED Discharge Orders         Ordered    cefdinir (OMNICEF) 300 MG capsule  2 times daily     03/11/20 0434           Orpah Greek, MD 03/11/20 [phone]

## 2020-03-11 NOTE — Consult Note (Signed)
Urology Consult   Physician requesting consult: Dr. Malachy Moan  Reason for consult: Left epididymitis and possible right testicular torsion  History of Present Illness: Edward Wells is a 74 y.o. who developed the acute onset of left sided testicular pain on Thursday while performing some strenuous work. He rested and his pain improved.  On Saturday he increased his activity and noted increased left testicular pain again.  He had some mild nausea but not vomiting or fever.  His pain persisted today and he presented to the ED for evaluation.  Upon arrival, he underwent a scrotal ultrasound.  This demonstrated hypervascularity and enlargement of the left epididymis and testis.  There appeared to be poor blood flow to the right testis incidentally. On further questioning, he denies any right testicular pain or discomfort.  He denies a history of right testicular pain in the past.  He denies dysuria, hematuria, urgency, or frequency.  He did have a urinary tract infection with fever one year ago but denies any testicular pain or problems at that time.  He denies a prior history of testicular or GU surgery.  He is no longer interested in fertility.    Past Medical History:  Diagnosis Date  . Acute gastric ulcer with hemorrhage 07/30/2014  . Hypertension     Past Surgical History:  Procedure Laterality Date  . COLONOSCOPY    . ESOPHAGOGASTRODUODENOSCOPY N/A 07/31/2014   Procedure: ESOPHAGOGASTRODUODENOSCOPY (EGD);  Surgeon: Gatha Mayer, MD;  Location: [facility] ENDOSCOPY;  Service: Endoscopy;  Laterality: N/A;  . ganglion cyst wrist  1971   right, x 2.  . HAMMER TOE SURGERY  1984   bilateral  . KNEE ARTHROSCOPY     left, 2004; right, 2001    Medications:  Home meds:  No current facility-administered medications on file prior to encounter.   Current Outpatient Medications on File Prior to Encounter  Medication Sig Dispense Refill  . Coenzyme Q10 (CO Q 10) 100 MG CAPS Take 100 mg by mouth  daily.    Marland Kitchen losartan (COZAAR) 25 MG tablet Take 25 mg by mouth daily.    . Multiple Vitamin (MULTIVITAMIN) tablet Take 1 tablet by mouth daily.       Scheduled Meds: Continuous Infusions: PRN Meds:.  Allergies:  Allergies  Allergen Reactions  . Furosemide     Muscle cramps    Family History  Problem Relation Age of Onset  . Hypertension Mother   . Colon cancer Neg Hx   . Stomach cancer Neg Hx   . Esophageal cancer Neg Hx   . Rectal cancer Neg Hx     Social History:  reports that he has quit smoking. He started smoking about 17 months ago. He has never used smokeless tobacco. He reports previous alcohol use. He reports that he does not use drugs.  ROS: A complete review of systems was performed.  All systems are negative except for pertinent findings as noted.  Physical Exam:  Vital signs in last 24 hours: Temp:  [99.6 F (37.6 C)] 99.6 F (37.6 C) (06/06 2114) Pulse Rate:  [92-98] 98 (06/07 0040) Resp:  [18-19] 19 (06/07 0039) BP: (164)/(77-91) 164/91 (06/07 0039) SpO2:  [99 %-100 %] 100 % (06/07 0040) Weight:  [107.5 kg] 107.5 kg (06/06 2227) Constitutional:  Alert and oriented, No acute distress Cardiovascular: Regular rate and rhythm, No JVD Respiratory: Normal respiratory effort GI: Abdomen is soft, nontender, nondistended, no abdominal masses Genitourinary: No CVAT. Normal male phallus, testes are descended bilaterally. The left  testis is significantly enlarged and tender.  No fluctuance is noted to suggest abscess.  The right testis is normal with a normal lie, normal cord, not high riding, and not tender.  It is palpable normal without masses. Lymphatic: No lymphadenopathy Neurologic: Grossly intact, no focal deficits Psychiatric: Normal mood and affect  Laboratory Data:  No results for input(s): WBC, HGB, HCT, PLT in the last 72 hours.  No results for input(s): NA, K, CL, GLUCOSE, BUN, CALCIUM, CREATININE in the last 72 hours.  Invalid input(s):  CO3   No results found for this or any previous visit (from the past 24 hour(s)). No results found for this or any previous visit (from the past 240 hour(s)).  Renal Function: No results for input(s): CREATININE in the last 168 hours. CrCl cannot be calculated (Patient's most recent lab result is older than the maximum 21 days allowed.).  Radiologic Imaging: US SCROTUM W/DOPPLER  Result Date: 03/11/2020 CLINICAL DATA:  Testicular pain and swelling EXAM: SCROTAL ULTRASOUND DOPPLER ULTRASOUND OF THE TESTICLES TECHNIQUE: Complete ultrasound examination of the testicles, epididymis, and other scrotal structures was performed. Color and spectral Doppler ultrasound were also utilized to evaluate blood flow to the testicles. COMPARISON:  None. FINDINGS: Right testicle Measurements: 4.0 x 8.1 x 2.5 cm, volume 15.8 mL. No concerning testicular mass or microlithiasis. Pulsed Doppler interrogation reveals color Doppler with arteriovenous waveforms seen only about the periphery of the right testicle. No central color flow or waveforms are identified. Left testicle Measurements: 3.8 x 2.8 x 3.2 cm, volume 17.8 mL. No concerning testicular mass or microlithiasis. There is mild heterogeneity of the testicular parenchyma. Normal arteriovenous waveforms are demonstrated on pulsed Doppler ultrasound evaluation. Right epididymis: There are at least 2 small anechoic cysts in the right epididymal head measuring up to 7 mm and 4 mm in size. Otherwise normal in size and appearance albeit without demonstrable color flow. Left epididymis: Thickened, heterogeneous appearance of the left epididymis with increased color Doppler vascularity. No concerning epididymal. Hydrocele: Bilateral hydroceles, left greater than right as well as some internal septations and debris within the left hydrocele. Varicocele:  None visualized. IMPRESSION: Absence of central color flow within the right testicle is highly worrisome for testicular  torsion with preserved capsular vascularity. Left epididymal and testicular heterogeneity with pronounced color Doppler flow and brisk waveforms could raise suspicion for a concomitant epididymo-orchitis. Bilateral hydroceles present. There is complexity of the left hydrocele and given that and suspected left epididymo-orchitis is present, pyocele cannot be excluded. These results were called by telephone at the time of interpretation on 03/11/2020 at 12:26 am to provider Pollina, who verbally acknowledged these results. Electronically Signed   By: Lovena Le M.D.   On: 03/11/2020 00:27    I independently reviewed the above imaging studies.  Impression/Recommendation 1) Left epididymitis: He will receive ceftriaxone in the ED and be discharged on 2 weeks of fluoroquinolone antibiotics for treatment.  I will arrange outpatient follow up. 2) Absent right testicular blood flow: I discussed this finding in detail with Edward Wells.  He gives no history of clinical symptoms to suggest acute testicular torsion.  Considering his age, this would also be highly unlikely.  It is most likely that his scrotal ultrasound is either a falsely positive exam or quite possibly that he may have suffered an event in the past and his findings are chronic.  Regardless, I did offer surgical exploration urgently, expressing to him that this represents the standard of care for possible testicular  torsion. He understands that failure to proceed with operative exploration could result in permanent loss of the right testis, which may adversely affect fertility and/or testosterone production.  He understands the risks of not proceeding with operative exploration but does wish to avoid this approach considering his history is not consistent with an acute torsion suggesting his findings may be long standing causing operative intervention to be unlikely to change the outcome of the right testis.  Furthermore, he is not interested in fertility  and accepts any impact on his testosterone production.  He finds the risk of general anesthesia to be more concerning to him than loss of his right testis.  As such, he does wish to avoid surgical intervention.  Finally, we discussed the risk of contralateral torsion possibly.  Again considering his situation, this is highly unlikely and he still does not feel that this risk outweighs the risk of general anesthesia to him.    Dutch Gray 03/11/2020, 12:54 AM    Pryor Curia MD  CC: Dr. Malachy Moan

## 2020-03-11 NOTE — ED Notes (Signed)
Discharge instructions discussed with pt. Pt verbalized understanding with no questions at this time.  

## 2020-03-11 NOTE — ED Notes (Signed)
Dr.Borden at bedside  ?

## 2020-04-17 DIAGNOSIS — M25562 Pain in left knee: Secondary | ICD-10-CM | POA: Diagnosis not present

## 2020-04-17 DIAGNOSIS — M1712 Unilateral primary osteoarthritis, left knee: Secondary | ICD-10-CM | POA: Diagnosis not present

## 2020-04-24 DIAGNOSIS — M1712 Unilateral primary osteoarthritis, left knee: Secondary | ICD-10-CM | POA: Diagnosis not present

## 2020-04-24 DIAGNOSIS — M25562 Pain in left knee: Secondary | ICD-10-CM | POA: Diagnosis not present

## 2020-04-25 DIAGNOSIS — M1711 Unilateral primary osteoarthritis, right knee: Secondary | ICD-10-CM | POA: Diagnosis not present

## 2020-04-25 DIAGNOSIS — M25561 Pain in right knee: Secondary | ICD-10-CM | POA: Diagnosis not present

## 2020-05-02 DIAGNOSIS — M25562 Pain in left knee: Secondary | ICD-10-CM | POA: Diagnosis not present

## 2020-05-02 DIAGNOSIS — M1712 Unilateral primary osteoarthritis, left knee: Secondary | ICD-10-CM | POA: Diagnosis not present

## 2020-05-09 DIAGNOSIS — M1711 Unilateral primary osteoarthritis, right knee: Secondary | ICD-10-CM | POA: Diagnosis not present

## 2020-05-09 DIAGNOSIS — M25561 Pain in right knee: Secondary | ICD-10-CM | POA: Diagnosis not present

## 2020-05-10 DIAGNOSIS — Z1389 Encounter for screening for other disorder: Secondary | ICD-10-CM | POA: Diagnosis not present

## 2020-05-10 DIAGNOSIS — I10 Essential (primary) hypertension: Secondary | ICD-10-CM | POA: Diagnosis not present

## 2020-05-10 DIAGNOSIS — I119 Hypertensive heart disease without heart failure: Secondary | ICD-10-CM | POA: Diagnosis not present

## 2020-05-10 DIAGNOSIS — E119 Type 2 diabetes mellitus without complications: Secondary | ICD-10-CM | POA: Diagnosis not present

## 2020-05-10 DIAGNOSIS — Z720 Tobacco use: Secondary | ICD-10-CM | POA: Diagnosis not present

## 2020-05-10 DIAGNOSIS — E6609 Other obesity due to excess calories: Secondary | ICD-10-CM | POA: Diagnosis not present

## 2020-05-10 DIAGNOSIS — R252 Cramp and spasm: Secondary | ICD-10-CM | POA: Diagnosis not present

## 2020-05-10 DIAGNOSIS — Z0110 Encounter for examination of ears and hearing without abnormal findings: Secondary | ICD-10-CM | POA: Diagnosis not present

## 2020-05-10 DIAGNOSIS — Z0000 Encounter for general adult medical examination without abnormal findings: Secondary | ICD-10-CM | POA: Diagnosis not present

## 2020-06-19 DIAGNOSIS — R3121 Asymptomatic microscopic hematuria: Secondary | ICD-10-CM | POA: Diagnosis not present

## 2020-06-19 DIAGNOSIS — N451 Epididymitis: Secondary | ICD-10-CM | POA: Diagnosis not present

## 2020-07-04 DIAGNOSIS — L600 Ingrowing nail: Secondary | ICD-10-CM | POA: Diagnosis not present

## 2020-07-04 DIAGNOSIS — M2041 Other hammer toe(s) (acquired), right foot: Secondary | ICD-10-CM | POA: Diagnosis not present

## 2020-07-04 DIAGNOSIS — M79672 Pain in left foot: Secondary | ICD-10-CM | POA: Diagnosis not present

## 2020-07-04 DIAGNOSIS — M79671 Pain in right foot: Secondary | ICD-10-CM | POA: Diagnosis not present

## 2020-07-18 DIAGNOSIS — Z23 Encounter for immunization: Secondary | ICD-10-CM | POA: Diagnosis not present

## 2020-07-22 DIAGNOSIS — B351 Tinea unguium: Secondary | ICD-10-CM | POA: Diagnosis not present

## 2020-07-22 DIAGNOSIS — M79675 Pain in left toe(s): Secondary | ICD-10-CM | POA: Diagnosis not present

## 2020-07-22 DIAGNOSIS — L600 Ingrowing nail: Secondary | ICD-10-CM | POA: Diagnosis not present

## 2020-07-22 DIAGNOSIS — M79674 Pain in right toe(s): Secondary | ICD-10-CM | POA: Diagnosis not present

## 2020-08-14 DIAGNOSIS — M21161 Varus deformity, not elsewhere classified, right knee: Secondary | ICD-10-CM | POA: Diagnosis not present

## 2020-08-14 DIAGNOSIS — M21162 Varus deformity, not elsewhere classified, left knee: Secondary | ICD-10-CM | POA: Diagnosis not present

## 2020-08-14 DIAGNOSIS — M25561 Pain in right knee: Secondary | ICD-10-CM | POA: Diagnosis not present

## 2020-08-14 DIAGNOSIS — M1712 Unilateral primary osteoarthritis, left knee: Secondary | ICD-10-CM | POA: Diagnosis not present

## 2020-08-14 DIAGNOSIS — M170 Bilateral primary osteoarthritis of knee: Secondary | ICD-10-CM | POA: Diagnosis not present

## 2020-08-14 DIAGNOSIS — F17200 Nicotine dependence, unspecified, uncomplicated: Secondary | ICD-10-CM | POA: Diagnosis not present

## 2020-08-14 DIAGNOSIS — M25562 Pain in left knee: Secondary | ICD-10-CM | POA: Diagnosis not present

## 2020-08-15 DIAGNOSIS — M1711 Unilateral primary osteoarthritis, right knee: Secondary | ICD-10-CM | POA: Diagnosis not present

## 2020-08-15 DIAGNOSIS — M25561 Pain in right knee: Secondary | ICD-10-CM | POA: Diagnosis not present

## 2020-08-16 DIAGNOSIS — I119 Hypertensive heart disease without heart failure: Secondary | ICD-10-CM | POA: Diagnosis not present

## 2020-08-16 DIAGNOSIS — Z720 Tobacco use: Secondary | ICD-10-CM | POA: Diagnosis not present

## 2020-08-16 DIAGNOSIS — I10 Essential (primary) hypertension: Secondary | ICD-10-CM | POA: Diagnosis not present

## 2020-08-16 DIAGNOSIS — E119 Type 2 diabetes mellitus without complications: Secondary | ICD-10-CM | POA: Diagnosis not present

## 2020-08-16 DIAGNOSIS — Z0001 Encounter for general adult medical examination with abnormal findings: Secondary | ICD-10-CM | POA: Diagnosis not present

## 2020-08-16 DIAGNOSIS — R252 Cramp and spasm: Secondary | ICD-10-CM | POA: Diagnosis not present

## 2020-08-16 DIAGNOSIS — E6609 Other obesity due to excess calories: Secondary | ICD-10-CM | POA: Diagnosis not present

## 2020-08-21 DIAGNOSIS — M1712 Unilateral primary osteoarthritis, left knee: Secondary | ICD-10-CM | POA: Diagnosis not present

## 2020-08-21 DIAGNOSIS — M25562 Pain in left knee: Secondary | ICD-10-CM | POA: Diagnosis not present

## 2020-08-22 DIAGNOSIS — M1711 Unilateral primary osteoarthritis, right knee: Secondary | ICD-10-CM | POA: Diagnosis not present

## 2020-08-22 DIAGNOSIS — M25561 Pain in right knee: Secondary | ICD-10-CM | POA: Diagnosis not present

## 2020-08-22 DIAGNOSIS — M79675 Pain in left toe(s): Secondary | ICD-10-CM | POA: Diagnosis not present

## 2020-08-22 DIAGNOSIS — B351 Tinea unguium: Secondary | ICD-10-CM | POA: Diagnosis not present

## 2020-08-22 DIAGNOSIS — L600 Ingrowing nail: Secondary | ICD-10-CM | POA: Diagnosis not present

## 2020-08-22 DIAGNOSIS — M79674 Pain in right toe(s): Secondary | ICD-10-CM | POA: Diagnosis not present

## 2020-08-27 DIAGNOSIS — M1712 Unilateral primary osteoarthritis, left knee: Secondary | ICD-10-CM | POA: Diagnosis not present

## 2020-08-27 DIAGNOSIS — M25562 Pain in left knee: Secondary | ICD-10-CM | POA: Diagnosis not present

## 2020-08-28 DIAGNOSIS — M25561 Pain in right knee: Secondary | ICD-10-CM | POA: Diagnosis not present

## 2020-08-28 DIAGNOSIS — M1711 Unilateral primary osteoarthritis, right knee: Secondary | ICD-10-CM | POA: Diagnosis not present

## 2020-09-04 DIAGNOSIS — M25562 Pain in left knee: Secondary | ICD-10-CM | POA: Diagnosis not present

## 2020-09-04 DIAGNOSIS — M1712 Unilateral primary osteoarthritis, left knee: Secondary | ICD-10-CM | POA: Diagnosis not present

## 2020-09-05 DIAGNOSIS — M25561 Pain in right knee: Secondary | ICD-10-CM | POA: Diagnosis not present

## 2020-09-05 DIAGNOSIS — M1711 Unilateral primary osteoarthritis, right knee: Secondary | ICD-10-CM | POA: Diagnosis not present

## 2020-09-17 ENCOUNTER — Inpatient Hospital Stay (HOSPITAL_COMMUNITY)
Admission: EM | Admit: 2020-09-17 | Discharge: 2020-10-02 | DRG: 853 | Disposition: A | Discharge status: Home Health | Payer: Medicare Other | Attending: Internal Medicine | Admitting: Internal Medicine

## 2020-09-17 ENCOUNTER — Emergency Department (HOSPITAL_COMMUNITY): Payer: Medicare Other

## 2020-09-17 DIAGNOSIS — A4151 Sepsis due to Escherichia coli [E. coli]: Principal | ICD-10-CM | POA: Diagnosis present

## 2020-09-17 DIAGNOSIS — I4892 Unspecified atrial flutter: Secondary | ICD-10-CM | POA: Diagnosis not present

## 2020-09-17 DIAGNOSIS — R6521 Severe sepsis with septic shock: Secondary | ICD-10-CM | POA: Diagnosis not present

## 2020-09-17 DIAGNOSIS — K2940 Chronic atrophic gastritis without bleeding: Secondary | ICD-10-CM | POA: Diagnosis present

## 2020-09-17 DIAGNOSIS — D72823 Leukemoid reaction: Secondary | ICD-10-CM | POA: Diagnosis present

## 2020-09-17 DIAGNOSIS — I480 Paroxysmal atrial fibrillation: Secondary | ICD-10-CM | POA: Diagnosis present

## 2020-09-17 DIAGNOSIS — E2740 Unspecified adrenocortical insufficiency: Secondary | ICD-10-CM | POA: Diagnosis present

## 2020-09-17 DIAGNOSIS — Z8249 Family history of ischemic heart disease and other diseases of the circulatory system: Secondary | ICD-10-CM

## 2020-09-17 DIAGNOSIS — Z20822 Contact with and (suspected) exposure to covid-19: Secondary | ICD-10-CM | POA: Diagnosis present

## 2020-09-17 DIAGNOSIS — Z66 Do not resuscitate: Secondary | ICD-10-CM | POA: Diagnosis not present

## 2020-09-17 DIAGNOSIS — K254 Chronic or unspecified gastric ulcer with hemorrhage: Secondary | ICD-10-CM | POA: Diagnosis not present

## 2020-09-17 DIAGNOSIS — I82401 Acute embolism and thrombosis of unspecified deep veins of right lower extremity: Secondary | ICD-10-CM | POA: Diagnosis present

## 2020-09-17 DIAGNOSIS — R52 Pain, unspecified: Secondary | ICD-10-CM | POA: Diagnosis not present

## 2020-09-17 DIAGNOSIS — R54 Age-related physical debility: Secondary | ICD-10-CM | POA: Diagnosis present

## 2020-09-17 DIAGNOSIS — E872 Acidosis, unspecified: Secondary | ICD-10-CM

## 2020-09-17 DIAGNOSIS — I959 Hypotension, unspecified: Secondary | ICD-10-CM | POA: Diagnosis not present

## 2020-09-17 DIAGNOSIS — N179 Acute kidney failure, unspecified: Secondary | ICD-10-CM | POA: Diagnosis present

## 2020-09-17 DIAGNOSIS — A419 Sepsis, unspecified organism: Secondary | ICD-10-CM | POA: Diagnosis present

## 2020-09-17 DIAGNOSIS — Z87891 Personal history of nicotine dependence: Secondary | ICD-10-CM

## 2020-09-17 DIAGNOSIS — K921 Melena: Secondary | ICD-10-CM

## 2020-09-17 DIAGNOSIS — I82431 Acute embolism and thrombosis of right popliteal vein: Secondary | ICD-10-CM | POA: Diagnosis not present

## 2020-09-17 DIAGNOSIS — I82421 Acute embolism and thrombosis of right iliac vein: Secondary | ICD-10-CM | POA: Diagnosis not present

## 2020-09-17 DIAGNOSIS — Z6828 Body mass index (BMI) 28.0-28.9, adult: Secondary | ICD-10-CM

## 2020-09-17 DIAGNOSIS — N132 Hydronephrosis with renal and ureteral calculous obstruction: Secondary | ICD-10-CM | POA: Diagnosis not present

## 2020-09-17 DIAGNOSIS — E870 Hyperosmolality and hypernatremia: Secondary | ICD-10-CM | POA: Diagnosis present

## 2020-09-17 DIAGNOSIS — D6959 Other secondary thrombocytopenia: Secondary | ICD-10-CM | POA: Diagnosis present

## 2020-09-17 DIAGNOSIS — N139 Obstructive and reflux uropathy, unspecified: Secondary | ICD-10-CM

## 2020-09-17 DIAGNOSIS — Z79899 Other long term (current) drug therapy: Secondary | ICD-10-CM

## 2020-09-17 DIAGNOSIS — I82411 Acute embolism and thrombosis of right femoral vein: Secondary | ICD-10-CM | POA: Diagnosis not present

## 2020-09-17 DIAGNOSIS — R0902 Hypoxemia: Secondary | ICD-10-CM | POA: Diagnosis not present

## 2020-09-17 DIAGNOSIS — E876 Hypokalemia: Secondary | ICD-10-CM | POA: Diagnosis not present

## 2020-09-17 DIAGNOSIS — B9620 Unspecified Escherichia coli [E. coli] as the cause of diseases classified elsewhere: Secondary | ICD-10-CM | POA: Diagnosis present

## 2020-09-17 DIAGNOSIS — E860 Dehydration: Secondary | ICD-10-CM | POA: Diagnosis present

## 2020-09-17 DIAGNOSIS — N1330 Unspecified hydronephrosis: Secondary | ICD-10-CM

## 2020-09-17 DIAGNOSIS — I82451 Acute embolism and thrombosis of right peroneal vein: Secondary | ICD-10-CM | POA: Diagnosis not present

## 2020-09-17 DIAGNOSIS — I82441 Acute embolism and thrombosis of right tibial vein: Secondary | ICD-10-CM | POA: Diagnosis not present

## 2020-09-17 DIAGNOSIS — R000 Tachycardia, unspecified: Secondary | ICD-10-CM | POA: Diagnosis not present

## 2020-09-17 DIAGNOSIS — N12 Tubulo-interstitial nephritis, not specified as acute or chronic: Secondary | ICD-10-CM | POA: Diagnosis not present

## 2020-09-17 DIAGNOSIS — I248 Other forms of acute ischemic heart disease: Secondary | ICD-10-CM | POA: Diagnosis not present

## 2020-09-17 DIAGNOSIS — I119 Hypertensive heart disease without heart failure: Secondary | ICD-10-CM | POA: Diagnosis present

## 2020-09-17 DIAGNOSIS — Z9181 History of falling: Secondary | ICD-10-CM

## 2020-09-17 DIAGNOSIS — E440 Moderate protein-calorie malnutrition: Secondary | ICD-10-CM | POA: Diagnosis present

## 2020-09-17 DIAGNOSIS — R6510 Systemic inflammatory response syndrome (SIRS) of non-infectious origin without acute organ dysfunction: Secondary | ICD-10-CM

## 2020-09-17 DIAGNOSIS — R6511 Systemic inflammatory response syndrome (SIRS) of non-infectious origin with acute organ dysfunction: Secondary | ICD-10-CM | POA: Diagnosis not present

## 2020-09-17 DIAGNOSIS — K267 Chronic duodenal ulcer without hemorrhage or perforation: Secondary | ICD-10-CM | POA: Diagnosis present

## 2020-09-17 DIAGNOSIS — Z8719 Personal history of other diseases of the digestive system: Secondary | ICD-10-CM

## 2020-09-17 DIAGNOSIS — E1165 Type 2 diabetes mellitus with hyperglycemia: Secondary | ICD-10-CM | POA: Diagnosis present

## 2020-09-17 DIAGNOSIS — I4891 Unspecified atrial fibrillation: Secondary | ICD-10-CM | POA: Diagnosis present

## 2020-09-17 DIAGNOSIS — I82409 Acute embolism and thrombosis of unspecified deep veins of unspecified lower extremity: Secondary | ICD-10-CM

## 2020-09-17 DIAGNOSIS — Z7901 Long term (current) use of anticoagulants: Secondary | ICD-10-CM

## 2020-09-17 DIAGNOSIS — D62 Acute posthemorrhagic anemia: Secondary | ICD-10-CM | POA: Diagnosis not present

## 2020-09-17 DIAGNOSIS — N136 Pyonephrosis: Secondary | ICD-10-CM | POA: Diagnosis present

## 2020-09-17 DIAGNOSIS — K922 Gastrointestinal hemorrhage, unspecified: Secondary | ICD-10-CM | POA: Diagnosis present

## 2020-09-17 DIAGNOSIS — Z0389 Encounter for observation for other suspected diseases and conditions ruled out: Secondary | ICD-10-CM | POA: Diagnosis not present

## 2020-09-17 DIAGNOSIS — D649 Anemia, unspecified: Secondary | ICD-10-CM

## 2020-09-17 DIAGNOSIS — N138 Other obstructive and reflux uropathy: Secondary | ICD-10-CM | POA: Diagnosis not present

## 2020-09-17 HISTORY — DX: Sleep apnea, unspecified: G47.30

## 2020-09-17 MED ORDER — SODIUM CHLORIDE 0.9 % IV SOLN
2.0000 g | Freq: Once | INTRAVENOUS | Status: AC
  Administered 2020-09-18: 01:00:00 2 g via INTRAVENOUS
  Filled 2020-09-17: qty 2

## 2020-09-17 MED ORDER — LACTATED RINGERS IV SOLN: INTRAVENOUS | Status: DC

## 2020-09-17 MED ORDER — LACTATED RINGERS IV BOLUS (SEPSIS)
1000.0000 mL | Freq: Once | INTRAVENOUS | Status: AC
  Administered 2020-09-17: 23:00:00 1000 mL via INTRAVENOUS

## 2020-09-17 MED ORDER — VANCOMYCIN HCL 2000 MG/400ML IV SOLN
2000.0000 mg | Freq: Once | INTRAVENOUS | Status: AC
  Administered 2020-09-18: 2000 mg via INTRAVENOUS
  Filled 2020-09-17: qty 400

## 2020-09-17 NOTE — Progress Notes (Signed)
A consult was received from an ED physician for vancomycin and cefepime per pharmacy dosing.  The patient's profile has been reviewed for ht/wt/allergies/indication/available labs.   A one time order has been placed for vancomycin 2gm and cefepime 2gm    Further antibiotics/pharmacy consults should be ordered by admitting physician if indicated.                       Thank you, Dolly Rias RPh 09/17/2020, 11:57 PM

## 2020-09-17 NOTE — ED Provider Notes (Signed)
[facility] DEPT Provider Note   CSN: [id number] Arrival date & time: 09/17/20  2138     History Chief Complaint  Patient presents with  . Diarrhea    N/V/D    Daymon Hora is a 74 y.o. male with a history of HTN, epididymitis, diabetes mellitus type 2, PUD, GI bleed (2015) who presents to the emergency department by EMS from home with a chief complaint of weakness.  Per EMS, the patient's neighbor went to check on the patient earlier today.  When the patient did not answer the door, they called for a wellness check.  When EMS arrived, the patient was found in the floor of the living room with a trail of feces behind him.  EMS reports that the patient has been having flulike symptoms, including nausea, vomiting, diarrhea for the last 5 days.  He was given 1500 cc of normal saline in route and a second liter was started upon arrival in the ER while he was waiting in the room.  He did have one episode of watery, brown stool in route with EMS.  EMS reported that the patient was alert, but was cool to the touch and somnolent.  Temporal thermometer was unable to read a temperature.  His initial systolic blood pressure was in the 80s and heart rate has persistently been in the 130s despite fluid resuscitation.  However, after 1500 cc of fluids, repeat blood pressure was 119/79.  CBG was 217.  The patient reports that just prior to EMS arrival that he had crawled as in his hands and knees into the living room.  He denies LOC.  He states that he has been feeling poorly about a week.  He notes that prior to onset of symptoms that he had a friend who told him that he had also recently been ill and was having chest pain.  Patient denies that he has been having chest pain, but has intermittently noticed some shortness of breath and cough.  He is endorsing abdominal pain, nausea, vomiting, and diarrhea.  He reports that he has had poor p.o. intake over the last few days and has  only been able to tolerate a small amount of water.  He does note when his symptoms began he went to the pharmacy and began taking over-the-counter cough and cold medication.  However, the medication made his mouth really dry so he returned to the pharmacy 5 days ago and was given a another medication, but he cannot recall the name.  He states that he began feeling very poorly after starting the new medication.  He has been followed by urology for UTI.  States that his last course of antibiotics was about a month ago, but he is unsure of the name of the antibiotic.  He denies any testicular pain or swelling.  He denies dysuria, hematuria.  Level 5 caveat secondary to altered mental status.   The history is provided by the patient, medical records and the EMS personnel. No language interpreter was used.       Past Medical History:  Diagnosis Date  . Acute gastric ulcer with hemorrhage 07/30/2014  . Hypertension     Patient Active Problem List   Diagnosis Date Noted  . Sepsis secondary to UTI ([place]) 02/16/2019  . Acute renal failure (ARF) ([place]) 02/16/2019  . Hyperglycemia 02/16/2019  . Acute gastric ulcer with hemorrhage 07/30/2014  . Essential hypertension 07/30/2014  . Normocytic anemia due to blood loss 07/30/2014  Past Surgical History:  Procedure Laterality Date  . COLONOSCOPY    . ESOPHAGOGASTRODUODENOSCOPY N/A 07/31/2014   Procedure: ESOPHAGOGASTRODUODENOSCOPY (EGD);  Surgeon: Gatha Mayer, MD;  Location: [facility] ENDOSCOPY;  Service: Endoscopy;  Laterality: N/A;  . ganglion cyst wrist  1971   right, x 2.  . HAMMER TOE SURGERY  1984   bilateral  . KNEE ARTHROSCOPY     left, 2004; right, 2001       Family History  Problem Relation Age of Onset  . Hypertension Mother   . Colon cancer Neg Hx   . Stomach cancer Neg Hx   . Esophageal cancer Neg Hx   . Rectal cancer Neg Hx     Social History   Tobacco Use  . Smoking status: Former Smoker    Start date: 10/05/2018   . Smokeless tobacco: Never Used  Substance Use Topics  . Alcohol use: Not Currently    Comment: h/o heavy use; last drank 10/04/2018  . Drug use: No    Home Medications Prior to Admission medications   Medication Sig Start Date End Date Taking? Authorizing Provider  acetaminophen (TYLENOL) 325 MG tablet Take 975 mg by mouth daily as needed for mild pain.   Yes [provider]  Coenzyme Q10 (CO Q 10) 100 MG CAPS Take 100 mg by mouth daily.   Yes [provider]  losartan (COZAAR) 50 MG tablet Take 50 mg by mouth daily. 01/18/20  Yes [provider]  Multiple Vitamin (MULTIVITAMIN) tablet Take 1 tablet by mouth daily.   Yes [provider]  tiZANidine (ZANAFLEX) 2 MG tablet Take 1 tablet by mouth 3 (three) times daily as needed for muscle spasms. 02/15/20  Yes [provider]  cefdinir (OMNICEF) 300 MG capsule Take 1 capsule (300 mg total) by mouth 2 (two) times daily. Patient not taking: Reported on 09/18/2020 03/11/20   Orpah Greek, MD    Allergies    Furosemide  Review of Systems   Review of Systems  Unable to perform ROS: Mental status change    Physical Exam Updated Vital Signs BP 103/65   Pulse (!) 143   Temp (!) 96.3 F (35.7 C) (Oral)   Resp 18   SpO2 97%   Physical Exam Vitals and nursing note reviewed.  Constitutional:      General: He is not in acute distress.    Appearance: He is well-developed. He is ill-appearing. He is not toxic-appearing or diaphoretic.     Comments: Rigors  HENT:     Head: Normocephalic.     Mouth/Throat:     Mouth: Mucous membranes are dry.  Eyes:     Conjunctiva/sclera: Conjunctivae normal.  Cardiovascular:     Rate and Rhythm: Regular rhythm. Tachycardia present.     Heart sounds: No murmur heard.   Pulmonary:     Effort: Pulmonary effort is normal. No respiratory distress.     Breath sounds: No stridor. No wheezing, rhonchi or rales.  Chest:     Chest wall: No tenderness.   Abdominal:     General: There is no distension.     Palpations: Abdomen is soft.     Tenderness: There is abdominal tenderness.     Comments: He is diffusely tender to palpation throughout the abdomen.  Abdomen is soft and nondistended.  Hyperactive bowel sounds in all 4 quadrants.  No peritoneal signs.  Musculoskeletal:     Cervical back: Normal range of motion and neck supple.  Right lower leg: No edema.     Left lower leg: No edema.  Skin:    Capillary Refill: Capillary refill takes 2 to 3 seconds.     Comments: Skin is cool to the touch.  Neurological:     Mental Status: He is alert.     Comments: Alert.  He is oriented to person, place, and time.  However, he has slow responses and some confusion with other questions.  Psychiatric:        Behavior: Behavior normal.     ED Results / Procedures / Treatments   Labs (all labs ordered are listed, but only abnormal results are displayed) Labs Reviewed  LACTIC ACID, PLASMA - Abnormal; Notable for the following components:      Result Value   Lactic Acid, Venous 3.1 (*)    All other components within normal limits  LACTIC ACID, PLASMA - Abnormal; Notable for the following components:   Lactic Acid, Venous 2.0 (*)    All other components within normal limits  COMPREHENSIVE METABOLIC PANEL - Abnormal; Notable for the following components:   Sodium 146 (*)    Chloride 114 (*)    CO2 18 (*)    Glucose, Bld 308 (*)    BUN 211 (*)    Creatinine, Ser 3.61 (*)    Calcium 7.7 (*)    Total Protein 6.0 (*)    Albumin 2.4 (*)    AST 13 (*)    GFR, Estimated 17 (*)    All other components within normal limits  CBC WITH DIFFERENTIAL/PLATELET - Abnormal; Notable for the following components:   WBC 47.3 (*)    Platelets 50 (*)    Neutro Abs 43.9 (*)    Abs Immature Granulocytes 0.96 (*)    All other components within normal limits  URINALYSIS, ROUTINE W REFLEX MICROSCOPIC - Abnormal; Notable for the following components:   Hgb  urine dipstick MODERATE (*)    Nitrite POSITIVE (*)    Leukocytes,Ua SMALL (*)    Bacteria, UA MANY (*)    All other components within normal limits  MAGNESIUM - Abnormal; Notable for the following components:   Magnesium 3.3 (*)    All other components within normal limits  RESP PANEL BY RT-PCR (FLU A&B, COVID) ARPGX2  CULTURE, BLOOD (ROUTINE X 2)  CULTURE, BLOOD (ROUTINE X 2)  URINE CULTURE  C DIFFICILE QUICK SCREEN W PCR REFLEX  LIPASE, BLOOD  I-STAT CHEM 8, ED    EKG EKG Interpretation  Date/Time:  Tuesday September 17 2020 22:24:54 EST Ventricular Rate:  136 PR Interval:    QRS Duration: 118 QT Interval:  387 QTC Calculation: 583 R Axis:   -15 Text Interpretation: Atrial flutter with predominant 2:1 AV block When compared with ECG of 02/16/2019, Atrial flutter has replaced Sinus rhythm Confirmed by Delora Fuel ([id number]) on 09/17/2020 11:10:34 PM   Radiology CT ABDOMEN PELVIS WO CONTRAST  Result Date: 09/18/2020 CLINICAL DATA:  Nausea, vomiting, leukocytosis, altered mental status EXAM: CT ABDOMEN AND PELVIS WITHOUT CONTRAST TECHNIQUE: Multidetector CT imaging of the abdomen and pelvis was performed following the standard protocol without IV contrast. COMPARISON:  None. FINDINGS: Lower chest: Mild bibasilar pulmonary scarring. Cardiac size within normal limits. Trace pericardial fluid is likely physiologic. Hepatobiliary: Multiple simple cysts are seen scattered within the liver. The liver is otherwise unremarkable. Gallbladder unremarkable. No intra or extrahepatic biliary ductal dilation. Pancreas: Unremarkable Spleen: Unremarkable Adrenals/Urinary Tract: The adrenal glands are unremarkable. The kidneys are normal in size  and position. There is moderate right hydronephrosis with 3 obstructing calculi noted within the proximal right ureter measuring are 7 x 9 x 13 mm inferiorly, 3 mm in the middle, and 8 x 10 x 15 mm superiorly. There is superimposed bilateral nonobstructing  nephrolithiasis, right greater than left, with multiple calculi measuring up to 7 mm. Multiple simple cortical cysts are seen within the kidneys bilaterally. No hydronephrosis on the left. A punctate 2 mm calculus is seen leg dependently within the bladder lumen. The bladder is otherwise unremarkable. Stomach/Bowel: Moderate pancolonic diverticulosis. The stomach, small bowel, and large bowel are otherwise unremarkable. No evidence of obstruction or focal inflammation. Appendix normal. No free intraperitoneal gas or fluid. Vascular/Lymphatic: Mild aortoiliac atherosclerotic calcification. No aortic aneurysm. No pathologic adenopathy within the abdomen and pelvis. Reproductive: Prostate is unremarkable. Other: Small bilateral fat containing inguinal hernias. Rectum unremarkable. Musculoskeletal: No acute bone abnormality. Degenerative changes are seen within the lumbar spine. IMPRESSION: At least 3 obstructing right proximal ureteral calculi resulting in moderate right hydronephrosis. Calculi measure up to 13 mm and 15 mm in greatest dimension. Superimposed moderate bilateral nonobstructing nephrolithiasis. 2 mm punctate calculus within the bladder lumen. Pancolonic moderate diverticulosis. Aortic Atherosclerosis (ICD10-I70.0). Electronically Signed   By: Fidela Salisbury MD   On: 09/18/2020 03:01   DG Chest Port 1 View  Result Date: 09/17/2020 CLINICAL DATA:  Possible sepsis EXAM: PORTABLE CHEST 1 VIEW COMPARISON:  02/16/2019 FINDINGS: The heart size and mediastinal contours are within normal limits. Both lungs are clear. The visualized skeletal structures are unremarkable. IMPRESSION: No active disease. Electronically Signed   By: Donavan Foil M.D.   On: 09/17/2020 22:54    Procedures .Critical Care Performed by: Joanne Gavel, PA-C Authorized by: Joanne Gavel, PA-C   Critical care provider statement:    Critical care time (minutes):  75   Critical care time was exclusive of:  Separately billable  procedures and treating other patients and teaching time   Critical care was necessary to treat or prevent imminent or life-threatening deterioration of the following conditions:  Metabolic crisis and dehydration   Critical care was time spent personally by me on the following activities:  Ordering and review of radiographic studies, ordering and review of laboratory studies, ordering and performing treatments and interventions, pulse oximetry, re-evaluation of patient's condition, review of old charts, obtaining history from patient or surrogate, examination of patient, evaluation of patient's response to treatment, discussions with consultants and development of treatment plan with patient or surrogate   I assumed direction of critical care for this patient from another provider in my specialty: no     Care discussed with: admitting provider     Care discussed with comment:  Critical care   (including critical care time)  Medications Ordered in ED Medications  lactated ringers infusion ( Intravenous New Bag/Given 09/18/20 0010)  insulin regular, human (MYXREDLIN) 100 units/ 100 mL infusion (has no administration in time range)  dextrose 50 % solution 0-50 mL (has no administration in time range)  lactated ringers bolus 1,000 mL (0 mLs Intravenous Stopped 09/18/20 0014)  vancomycin (VANCOREADY) IVPB 2000 mg/400 mL (0 mg Intravenous Stopped 09/18/20 0216)  ceFEPIme (MAXIPIME) 2 g in sodium chloride 0.9 % 100 mL IVPB (0 g Intravenous Stopped 09/18/20 0107)  acetaminophen (TYLENOL) tablet 650 mg (650 mg Oral Given 09/18/20 0033)  lactated ringers bolus 1,000 mL (0 mLs Intravenous Stopped 09/18/20 0230)    ED Course  I have reviewed the triage vital signs  and the nursing notes.  Pertinent labs & imaging results that were available during my care of the patient were reviewed by me and considered in my medical decision making (see chart for details).  Clinical Course as of 09/18/20 0528  Wed  Sep 18, 2020  0357 Spoke with Dr. Lynetta Mare with critical care.  They will see and evaluate the patient.  Urology will need to be consulted. Critical care will plan to consult urology.  They recommend continuing fluid resuscitation and ensuring the patient has received antibiotics, which have already been initiated. [MM]  0515 WBC(!): 47.3 [MM]  0516 Magnesium(!): 3.3 [MM]  0516 NEUT#(!): 43.9 [MM]  0516 Creatinine(!): 3.61 [MM]  0516 BUN(!): 211 [MM]  0516 Lactic Acid, Venous(!!): 3.1 [MM]    Clinical Course User Index [MM] Heraclio Seidman, Laymond Purser, PA-C   MDM Rules/Calculators/A&P                          74 year old male with a history of HTN, epididymitis, diabetes mellitus type 2, PUD, GI bleed (2015) who presents the emergency department via EMS from home.  He was found laying on the floor of his living room with a trail of feces.  He endorses vomiting and diarrhea for the last week.  Although he is alert and oriented x3, he has confusion about details over the last few days.  EMS reported a blood pressure of 88/40 and somnolence on arrival.  He was tachycardic in the 140s.  Hypothermic to 95.  No hypoxia or tachypnea.  He has generalized tenderness to palpation throughout the abdomen.  He is alert, but is drowsy and confused.  The patient was seen and independently evaluated by Dr. Roxanne Mins, attending physician.   Labs and imaging have been reviewed and independently interpreted by me.  He is in acute renal failure with a creatinine of 3.61.  This is likely secondary to profound dehydration from vomiting and diarrhea as his BUN is also significantly elevated to 211 as BUN/Cr is >20.  He also has a metabolic acidosis that is thought to be a lactic acidosis as his creatinine is 3.1, which is also likely secondary to profound dehydration.  However, this picture is complicated by leukocytosis of 47.3, which could be reactive with a component of hemoconcentration secondary to hypovolemia.  However,  given tachycardia, hypotension, hypothermia, the patient met sepsis criteria and code sepsis was initiated.  He was treated with cefepime and vancomycin.    CT demonstrating at least 3 obstructing right proximal ureteral calculi with moderate right hydronephrosis.  No hydronephrosis on the left.  Given that patient does meet sepsis criteria and does also have obstructive uropathy, he will need to be evaluated by urology for possible stent placement as UA is also concerning for infection.  I suspect that his hemodynamic instability and acute renal failure are driven by dehydration after further discussing the patient with Dr. Roxanne Mins.  Discussed contact the urology at this time, but he recommends waiting until the patient is admitted. Spoke with Dr. Lynetta Mare, critical care, will accept the patient for admission.  Critical care will touch base with urology.  He was given 1500 cc of fluids prior to arrival in the ER and received 3 L of fluids in the ER and was started on maintenance fluids at 150 cc an hour of LR, significantly more than 30 cc/kg bolus.  He was given Tylenol for pain control.  Hypothermia gradually improved.  Patient's map remained above  60, but blood pressures were soft.  He remained tachycardic.  EKG concerning for atrial flutter.  Discussed cardioversion with Dr. Roxanne Mins, but deferred to wait until the patient was evaluated by critical care.  Although patient continues to be tachycardic, clinically has mentation has markedly improved since he arrived in the ER.  He is now alert, answering questions appropriately, and confusion has resolved.  Reports that events over the last 48 hours have been very foggy and are unclear.  Patient's family has been updated by RN.  I have updated the patient on labs and results at this time.  Additional pain control has been deferred given patient's hemodynamic stability.  The patient appears reasonably stabilized for admission considering the current  resources, flow, and capabilities available in the ED at this time, and I doubt any other [facility] requiring further screening and/or treatment in the ED prior to admission.   Final Clinical Impression(s) / ED Diagnoses Final diagnoses:  Dehydration  SIRS (systemic inflammatory response syndrome) (HCC)  Acute unilateral obstructive uropathy  Lactic acidosis  Acute renal failure, unspecified acute renal failure type [facility])    Rx / DC Orders ED Discharge Orders    None       Joanne Gavel, PA-C 72/09/47 0962    Delora Fuel, MD 83/66/29 [phone]

## 2020-09-17 NOTE — ED Triage Notes (Signed)
Patient BIB [facility]. EMS reports being called out to home for a welfare check. Pt's neighbors stated he would not answer the phone or door today. EMS also reports finding patient on floor with a trail of diarrhea from bathroom to where is was lying. Initially. Patient was initally hypotensive 88/40 and lethargic. NS 1515ml bolus given. Patient more alert and oriented and BP 120/80

## 2020-09-17 NOTE — ED Triage Notes (Signed)
Pt was found by EMS in the bathroom with a trail of diarrhea and then was hypotensive

## 2020-09-18 ENCOUNTER — Other Ambulatory Visit: Payer: Self-pay

## 2020-09-18 ENCOUNTER — Inpatient Hospital Stay (HOSPITAL_COMMUNITY): Payer: Medicare Other | Admitting: Certified Registered Nurse Anesthetist

## 2020-09-18 ENCOUNTER — Inpatient Hospital Stay (HOSPITAL_COMMUNITY): Payer: Medicare Other

## 2020-09-18 ENCOUNTER — Encounter (HOSPITAL_COMMUNITY): Payer: Self-pay | Admitting: Pulmonary Disease

## 2020-09-18 ENCOUNTER — Encounter (HOSPITAL_COMMUNITY)
Admission: EM | Disposition: A | Discharge status: Home Health | Payer: Self-pay | Source: Home / Self Care | Attending: Internal Medicine

## 2020-09-18 ENCOUNTER — Emergency Department (HOSPITAL_COMMUNITY): Payer: Medicare Other

## 2020-09-18 DIAGNOSIS — R1031 Right lower quadrant pain: Secondary | ICD-10-CM | POA: Diagnosis not present

## 2020-09-18 DIAGNOSIS — K267 Chronic duodenal ulcer without hemorrhage or perforation: Secondary | ICD-10-CM | POA: Diagnosis present

## 2020-09-18 DIAGNOSIS — I248 Other forms of acute ischemic heart disease: Secondary | ICD-10-CM | POA: Diagnosis not present

## 2020-09-18 DIAGNOSIS — K921 Melena: Secondary | ICD-10-CM | POA: Diagnosis not present

## 2020-09-18 DIAGNOSIS — K254 Chronic or unspecified gastric ulcer with hemorrhage: Secondary | ICD-10-CM | POA: Diagnosis not present

## 2020-09-18 DIAGNOSIS — R079 Chest pain, unspecified: Secondary | ICD-10-CM | POA: Diagnosis not present

## 2020-09-18 DIAGNOSIS — R6510 Systemic inflammatory response syndrome (SIRS) of non-infectious origin without acute organ dysfunction: Secondary | ICD-10-CM | POA: Diagnosis not present

## 2020-09-18 DIAGNOSIS — K31A0 Gastric intestinal metaplasia, unspecified: Secondary | ICD-10-CM | POA: Diagnosis not present

## 2020-09-18 DIAGNOSIS — A4151 Sepsis due to Escherichia coli [E. coli]: Secondary | ICD-10-CM | POA: Diagnosis not present

## 2020-09-18 DIAGNOSIS — I824Z1 Acute embolism and thrombosis of unspecified deep veins of right distal lower extremity: Secondary | ICD-10-CM | POA: Diagnosis not present

## 2020-09-18 DIAGNOSIS — K250 Acute gastric ulcer with hemorrhage: Secondary | ICD-10-CM | POA: Diagnosis not present

## 2020-09-18 DIAGNOSIS — Z20822 Contact with and (suspected) exposure to covid-19: Secondary | ICD-10-CM | POA: Diagnosis not present

## 2020-09-18 DIAGNOSIS — D62 Acute posthemorrhagic anemia: Secondary | ICD-10-CM | POA: Diagnosis not present

## 2020-09-18 DIAGNOSIS — N132 Hydronephrosis with renal and ureteral calculous obstruction: Secondary | ICD-10-CM | POA: Diagnosis not present

## 2020-09-18 DIAGNOSIS — E2740 Unspecified adrenocortical insufficiency: Secondary | ICD-10-CM | POA: Diagnosis not present

## 2020-09-18 DIAGNOSIS — K269 Duodenal ulcer, unspecified as acute or chronic, without hemorrhage or perforation: Secondary | ICD-10-CM | POA: Diagnosis not present

## 2020-09-18 DIAGNOSIS — N12 Tubulo-interstitial nephritis, not specified as acute or chronic: Secondary | ICD-10-CM | POA: Diagnosis present

## 2020-09-18 DIAGNOSIS — I82431 Acute embolism and thrombosis of right popliteal vein: Secondary | ICD-10-CM | POA: Diagnosis not present

## 2020-09-18 DIAGNOSIS — A419 Sepsis, unspecified organism: Secondary | ICD-10-CM | POA: Diagnosis not present

## 2020-09-18 DIAGNOSIS — B9620 Unspecified Escherichia coli [E. coli] as the cause of diseases classified elsewhere: Secondary | ICD-10-CM | POA: Diagnosis not present

## 2020-09-18 DIAGNOSIS — E872 Acidosis: Secondary | ICD-10-CM | POA: Diagnosis not present

## 2020-09-18 DIAGNOSIS — D6959 Other secondary thrombocytopenia: Secondary | ICD-10-CM | POA: Diagnosis present

## 2020-09-18 DIAGNOSIS — N179 Acute kidney failure, unspecified: Secondary | ICD-10-CM | POA: Diagnosis not present

## 2020-09-18 DIAGNOSIS — Z7901 Long term (current) use of anticoagulants: Secondary | ICD-10-CM | POA: Diagnosis not present

## 2020-09-18 DIAGNOSIS — N139 Obstructive and reflux uropathy, unspecified: Secondary | ICD-10-CM | POA: Diagnosis not present

## 2020-09-18 DIAGNOSIS — R7881 Bacteremia: Secondary | ICD-10-CM | POA: Diagnosis not present

## 2020-09-18 DIAGNOSIS — I82411 Acute embolism and thrombosis of right femoral vein: Secondary | ICD-10-CM | POA: Diagnosis not present

## 2020-09-18 DIAGNOSIS — K3189 Other diseases of stomach and duodenum: Secondary | ICD-10-CM | POA: Diagnosis not present

## 2020-09-18 DIAGNOSIS — I82421 Acute embolism and thrombosis of right iliac vein: Secondary | ICD-10-CM | POA: Diagnosis not present

## 2020-09-18 DIAGNOSIS — E1165 Type 2 diabetes mellitus with hyperglycemia: Secondary | ICD-10-CM | POA: Diagnosis present

## 2020-09-18 DIAGNOSIS — I480 Paroxysmal atrial fibrillation: Secondary | ICD-10-CM | POA: Diagnosis present

## 2020-09-18 DIAGNOSIS — I10 Essential (primary) hypertension: Secondary | ICD-10-CM | POA: Diagnosis not present

## 2020-09-18 DIAGNOSIS — K2950 Unspecified chronic gastritis without bleeding: Secondary | ICD-10-CM | POA: Diagnosis not present

## 2020-09-18 DIAGNOSIS — Z66 Do not resuscitate: Secondary | ICD-10-CM | POA: Diagnosis not present

## 2020-09-18 DIAGNOSIS — N201 Calculus of ureter: Secondary | ICD-10-CM | POA: Diagnosis not present

## 2020-09-18 DIAGNOSIS — M7989 Other specified soft tissue disorders: Secondary | ICD-10-CM | POA: Diagnosis not present

## 2020-09-18 DIAGNOSIS — R112 Nausea with vomiting, unspecified: Secondary | ICD-10-CM | POA: Diagnosis not present

## 2020-09-18 DIAGNOSIS — R54 Age-related physical debility: Secondary | ICD-10-CM | POA: Diagnosis present

## 2020-09-18 DIAGNOSIS — D649 Anemia, unspecified: Secondary | ICD-10-CM | POA: Diagnosis not present

## 2020-09-18 DIAGNOSIS — E870 Hyperosmolality and hypernatremia: Secondary | ICD-10-CM | POA: Diagnosis not present

## 2020-09-18 DIAGNOSIS — I82409 Acute embolism and thrombosis of unspecified deep veins of unspecified lower extremity: Secondary | ICD-10-CM | POA: Diagnosis not present

## 2020-09-18 DIAGNOSIS — K279 Peptic ulcer, site unspecified, unspecified as acute or chronic, without hemorrhage or perforation: Secondary | ICD-10-CM | POA: Diagnosis not present

## 2020-09-18 DIAGNOSIS — E440 Moderate protein-calorie malnutrition: Secondary | ICD-10-CM | POA: Diagnosis not present

## 2020-09-18 DIAGNOSIS — K259 Gastric ulcer, unspecified as acute or chronic, without hemorrhage or perforation: Secondary | ICD-10-CM | POA: Diagnosis not present

## 2020-09-18 DIAGNOSIS — I361 Nonrheumatic tricuspid (valve) insufficiency: Secondary | ICD-10-CM | POA: Diagnosis not present

## 2020-09-18 DIAGNOSIS — N136 Pyonephrosis: Secondary | ICD-10-CM | POA: Diagnosis not present

## 2020-09-18 DIAGNOSIS — I82451 Acute embolism and thrombosis of right peroneal vein: Secondary | ICD-10-CM | POA: Diagnosis not present

## 2020-09-18 DIAGNOSIS — K922 Gastrointestinal hemorrhage, unspecified: Secondary | ICD-10-CM | POA: Diagnosis not present

## 2020-09-18 DIAGNOSIS — I4892 Unspecified atrial flutter: Secondary | ICD-10-CM | POA: Diagnosis present

## 2020-09-18 DIAGNOSIS — E860 Dehydration: Secondary | ICD-10-CM | POA: Diagnosis not present

## 2020-09-18 DIAGNOSIS — R6521 Severe sepsis with septic shock: Secondary | ICD-10-CM | POA: Diagnosis not present

## 2020-09-18 DIAGNOSIS — N281 Cyst of kidney, acquired: Secondary | ICD-10-CM | POA: Diagnosis not present

## 2020-09-18 DIAGNOSIS — I82441 Acute embolism and thrombosis of right tibial vein: Secondary | ICD-10-CM | POA: Diagnosis not present

## 2020-09-18 DIAGNOSIS — I119 Hypertensive heart disease without heart failure: Secondary | ICD-10-CM | POA: Diagnosis present

## 2020-09-18 HISTORY — PX: CYSTOSCOPY W/ URETERAL STENT PLACEMENT: SHX1429

## 2020-09-18 LAB — LACTIC ACID, PLASMA
Lactic Acid, Venous: 2 mmol/L (ref 0.5–1.9)
Lactic Acid, Venous: 3.1 mmol/L (ref 0.5–1.9)
Lactic Acid, Venous: 1.8 mmol/L (ref 0.5–1.9)

## 2020-09-18 LAB — BLOOD CULTURE ID PANEL (REFLEXED) - BCID2

## 2020-09-18 LAB — DIC (DISSEMINATED INTRAVASCULAR COAGULATION)PANEL
D-Dimer, Quant: >20 ug/mL-FEU — ABNORMAL HIGH (ref 0.00–0.50)
Fibrinogen: 403 mg/dL (ref 210–475)
INR: 1.5 — ABNORMAL HIGH (ref 0.8–1.2)
Platelets: 39 10*3/uL — ABNORMAL LOW (ref 150–400)
Prothrombin Time: 17.5 seconds — ABNORMAL HIGH (ref 11.4–15.2)
Smear Review: NONE SEEN
aPTT: 29 seconds (ref 24–36)

## 2020-09-18 LAB — CBG MONITORING, ED
Glucose-Capillary: 221 mg/dL — ABNORMAL HIGH (ref 70–99)
Glucose-Capillary: 186 mg/dL — ABNORMAL HIGH (ref 70–99)

## 2020-09-18 LAB — COMPREHENSIVE METABOLIC PANEL
ALT: 20 U/L (ref 0–44)
ALT: 18 U/L (ref 0–44)
AST: 13 U/L — ABNORMAL LOW (ref 15–41)
AST: 11 U/L — ABNORMAL LOW (ref 15–41)
Albumin: 2.4 g/dL — ABNORMAL LOW (ref 3.5–5.0)
Albumin: 1.9 g/dL — ABNORMAL LOW (ref 3.5–5.0)
Alkaline Phosphatase: 101 U/L (ref 38–126)
Alkaline Phosphatase: 344 U/L — ABNORMAL HIGH (ref 38–126)
Anion gap: 14 (ref 5–15)
Anion gap: 13 (ref 5–15)
BUN: 211 mg/dL — ABNORMAL HIGH (ref 8–23)
BUN: 193 mg/dL — ABNORMAL HIGH (ref 8–23)
CO2: 18 mmol/L — ABNORMAL LOW (ref 22–32)
CO2: 20 mmol/L — ABNORMAL LOW (ref 22–32)
Calcium: 7.7 mg/dL — ABNORMAL LOW (ref 8.9–10.3)
Calcium: 7.8 mg/dL — ABNORMAL LOW (ref 8.9–10.3)
Chloride: 114 mmol/L — ABNORMAL HIGH (ref 98–111)
Chloride: 117 mmol/L — ABNORMAL HIGH (ref 98–111)
Creatinine, Ser: 3.61 mg/dL — ABNORMAL HIGH (ref 0.61–1.24)
Creatinine, Ser: 3.03 mg/dL — ABNORMAL HIGH (ref 0.61–1.24)
GFR, Estimated: 17 mL/min — ABNORMAL LOW (ref >60)
GFR, Estimated: 21 mL/min — ABNORMAL LOW (ref >60)
Glucose, Bld: 308 mg/dL — ABNORMAL HIGH (ref 70–99)
Glucose, Bld: 221 mg/dL — ABNORMAL HIGH (ref 70–99)
Potassium: 3.9 mmol/L (ref 3.5–5.1)
Potassium: 3.8 mmol/L (ref 3.5–5.1)
Sodium: 146 mmol/L — ABNORMAL HIGH (ref 135–145)
Sodium: 150 mmol/L — ABNORMAL HIGH (ref 135–145)
Total Bilirubin: 0.8 mg/dL (ref 0.3–1.2)
Total Bilirubin: 1 mg/dL (ref 0.3–1.2)
Total Protein: 6 g/dL — ABNORMAL LOW (ref 6.5–8.1)
Total Protein: 4.9 g/dL — ABNORMAL LOW (ref 6.5–8.1)

## 2020-09-18 LAB — GLUCOSE, CAPILLARY
Glucose-Capillary: 194 mg/dL — ABNORMAL HIGH (ref 70–99)
Glucose-Capillary: 208 mg/dL — ABNORMAL HIGH (ref 70–99)
Glucose-Capillary: 211 mg/dL — ABNORMAL HIGH (ref 70–99)
Glucose-Capillary: 219 mg/dL — ABNORMAL HIGH (ref 70–99)
Glucose-Capillary: 227 mg/dL — ABNORMAL HIGH (ref 70–99)

## 2020-09-18 LAB — CBC WITH DIFFERENTIAL/PLATELET
Abs Immature Granulocytes: 0.96 10*3/uL — ABNORMAL HIGH (ref 0.00–0.07)
Basophils Absolute: 0 10*3/uL (ref 0.0–0.1)
Basophils Relative: 0 %
Eosinophils Absolute: 0.2 10*3/uL (ref 0.0–0.5)
Eosinophils Relative: 0 %
HCT: 41.6 % (ref 39.0–52.0)
Hemoglobin: 13.8 g/dL (ref 13.0–17.0)
Immature Granulocytes: 2 %
Lymphocytes Relative: 3 %
Lymphs Abs: 1.2 10*3/uL (ref 0.7–4.0)
MCH: 27 pg (ref 26.0–34.0)
MCHC: 33.2 g/dL (ref 30.0–36.0)
MCV: 81.3 fL (ref 80.0–100.0)
Monocytes Absolute: 1 10*3/uL (ref 0.1–1.0)
Monocytes Relative: 2 %
Neutro Abs: 43.9 10*3/uL — ABNORMAL HIGH (ref 1.7–7.7)
Neutrophils Relative %: 93 %
Platelets: 50 10*3/uL — ABNORMAL LOW (ref 150–400)
RBC: 5.12 MIL/uL (ref 4.22–5.81)
RDW: 14.6 % (ref 11.5–15.5)
WBC: 47.3 10*3/uL — ABNORMAL HIGH (ref 4.0–10.5)
nRBC: 0 % (ref 0.0–0.2)

## 2020-09-18 LAB — URINALYSIS, ROUTINE W REFLEX MICROSCOPIC
Bilirubin Urine: NEGATIVE
Glucose, UA: NEGATIVE mg/dL
Ketones, ur: NEGATIVE mg/dL
Nitrite: POSITIVE — AB
Protein, ur: NEGATIVE mg/dL
Specific Gravity, Urine: 1.013 (ref 1.005–1.030)
pH: 5 (ref 5.0–8.0)

## 2020-09-18 LAB — RESP PANEL BY RT-PCR (FLU A&B, COVID) ARPGX2
Influenza A by PCR: NEGATIVE
Influenza B by PCR: NEGATIVE
SARS Coronavirus 2 by RT PCR: NEGATIVE

## 2020-09-18 LAB — LIPASE, BLOOD: Lipase: 35 U/L (ref 11–51)

## 2020-09-18 LAB — TSH: TSH: 1.542 u[IU]/mL (ref 0.350–4.500)

## 2020-09-18 LAB — MRSA PCR SCREENING: MRSA by PCR: NEGATIVE

## 2020-09-18 LAB — HEMOGLOBIN A1C
Hgb A1c MFr Bld: 6.6 % — ABNORMAL HIGH (ref 4.8–5.6)
Mean Plasma Glucose: 142.72 mg/dL

## 2020-09-18 LAB — MAGNESIUM: Magnesium: 3.3 mg/dL — ABNORMAL HIGH (ref 1.7–2.4)

## 2020-09-18 SURGERY — CYSTOSCOPY, WITH RETROGRADE PYELOGRAM AND URETERAL STENT INSERTION
Anesthesia: General | Laterality: Right

## 2020-09-18 MED ORDER — LACTATED RINGERS IV SOLN: INTRAVENOUS | Status: DC

## 2020-09-18 MED ORDER — FENTANYL CITRATE (PF) 100 MCG/2ML IJ SOLN: 25.0000 ug | INTRAMUSCULAR | Status: DC | PRN

## 2020-09-18 MED ORDER — LACTATED RINGERS IV BOLUS
1000.0000 mL | Freq: Once | INTRAVENOUS | Status: AC
  Administered 2020-09-18: 10:00:00 1000 mL via INTRAVENOUS

## 2020-09-18 MED ORDER — SUCCINYLCHOLINE CHLORIDE 20 MG/ML IJ SOLN
INTRAMUSCULAR | Status: DC | PRN
  Administered 2020-09-18: 160 mg via INTRAVENOUS

## 2020-09-18 MED ORDER — FENTANYL CITRATE (PF) 100 MCG/2ML IJ SOLN
INTRAMUSCULAR | Status: AC
  Filled 2020-09-18: qty 2

## 2020-09-18 MED ORDER — PROPOFOL 10 MG/ML IV BOLUS
INTRAVENOUS | Status: DC | PRN
  Administered 2020-09-18: 100 mg via INTRAVENOUS

## 2020-09-18 MED ORDER — LIDOCAINE HCL (PF) 2 % IJ SOLN
INTRAMUSCULAR | Status: AC
  Filled 2020-09-18: qty 5

## 2020-09-18 MED ORDER — NOREPINEPHRINE 4 MG/250ML-% IV SOLN
2.0000 ug/min | INTRAVENOUS | Status: DC
  Administered 2020-09-18: 11:00:00 2 ug/min via INTRAVENOUS
  Filled 2020-09-18: qty 250

## 2020-09-18 MED ORDER — SODIUM CHLORIDE 0.9 % IV SOLN: 250.0000 mL | INTRAVENOUS | Status: DC

## 2020-09-18 MED ORDER — AMIODARONE HCL IN DEXTROSE 360-4.14 MG/200ML-% IV SOLN
30.0000 mg/h | INTRAVENOUS | Status: DC
  Administered 2020-09-18 – 2020-09-19 (×2): 30 mg/h via INTRAVENOUS
  Filled 2020-09-18 (×4): qty 200

## 2020-09-18 MED ORDER — AMIODARONE HCL IN DEXTROSE 360-4.14 MG/200ML-% IV SOLN
60.0000 mg/h | INTRAVENOUS | Status: AC
  Administered 2020-09-18: 07:00:00 60 mg/h via INTRAVENOUS
  Filled 2020-09-18: qty 200

## 2020-09-18 MED ORDER — FENTANYL CITRATE (PF) 100 MCG/2ML IJ SOLN
INTRAMUSCULAR | Status: DC | PRN
  Administered 2020-09-18: 25 ug via INTRAVENOUS
  Administered 2020-09-18: 50 ug via INTRAVENOUS

## 2020-09-18 MED ORDER — DEXAMETHASONE SODIUM PHOSPHATE 10 MG/ML IJ SOLN
INTRAMUSCULAR | Status: AC
  Filled 2020-09-18: qty 1

## 2020-09-18 MED ORDER — CHLORHEXIDINE GLUCONATE CLOTH 2 % EX PADS
6.0000 | MEDICATED_PAD | Freq: Every day | CUTANEOUS | Status: DC
  Administered 2020-09-18 – 2020-09-20 (×3): 6 via TOPICAL

## 2020-09-18 MED ORDER — CHLORHEXIDINE GLUCONATE 0.12% ORAL RINSE (MEDLINE KIT)
15.0000 mL | Freq: Once | OROMUCOSAL | Status: AC
  Administered 2020-09-18: 11:00:00 15 mL via OROMUCOSAL

## 2020-09-18 MED ORDER — ACETAMINOPHEN 325 MG PO TABS
650.0000 mg | ORAL_TABLET | Freq: Once | ORAL | Status: AC
  Administered 2020-09-18: 01:00:00 650 mg via ORAL
  Filled 2020-09-18: qty 2

## 2020-09-18 MED ORDER — ACETAMINOPHEN 10 MG/ML IV SOLN
1000.0000 mg | Freq: Once | INTRAVENOUS | Status: AC
  Administered 2020-09-18: 07:00:00 1000 mg via INTRAVENOUS
  Filled 2020-09-18 (×2): qty 100

## 2020-09-18 MED ORDER — ONDANSETRON HCL 4 MG/2ML IJ SOLN
4.0000 mg | Freq: Four times a day (QID) | INTRAMUSCULAR | Status: DC | PRN
  Administered 2020-09-18 – 2020-09-24 (×5): 4 mg via INTRAVENOUS
  Filled 2020-09-18 (×5): qty 2

## 2020-09-18 MED ORDER — VASOPRESSIN 20 UNIT/ML IV SOLN
INTRAVENOUS | Status: AC
  Filled 2020-09-18: qty 1

## 2020-09-18 MED ORDER — ONDANSETRON HCL 4 MG/2ML IJ SOLN
INTRAMUSCULAR | Status: DC | PRN
  Administered 2020-09-18: 4 mg via INTRAVENOUS

## 2020-09-18 MED ORDER — SODIUM CHLORIDE 0.9 % IV SOLN
2.0000 g | INTRAVENOUS | Status: DC
  Administered 2020-09-18 – 2020-09-24 (×7): 2 g via INTRAVENOUS
  Filled 2020-09-18: qty 2
  Filled 2020-09-18: qty 20
  Filled 2020-09-18: qty 2
  Filled 2020-09-18 (×3): qty 20
  Filled 2020-09-18 (×2): qty 2

## 2020-09-18 MED ORDER — HYDROMORPHONE HCL 1 MG/ML IJ SOLN
0.5000 mg | INTRAMUSCULAR | Status: DC | PRN
  Administered 2020-09-18 – 2020-09-23 (×8): 0.5 mg via INTRAVENOUS
  Filled 2020-09-18: qty 0.5
  Filled 2020-09-18: qty 1
  Filled 2020-09-18 (×5): qty 0.5
  Filled 2020-09-18 (×2): qty 1

## 2020-09-18 MED ORDER — LIDOCAINE 2% (20 MG/ML) 5 ML SYRINGE
INTRAMUSCULAR | Status: DC | PRN
  Administered 2020-09-18: 60 mg via INTRAVENOUS

## 2020-09-18 MED ORDER — INSULIN REGULAR(HUMAN) IN NACL 100-0.9 UT/100ML-% IV SOLN
INTRAVENOUS | Status: DC
  Filled 2020-09-18: qty 100

## 2020-09-18 MED ORDER — SODIUM CHLORIDE 0.9 % IV SOLN: 2.0000 g | INTRAVENOUS | Status: DC

## 2020-09-18 MED ORDER — PHENYLEPHRINE HCL-NACL 10-0.9 MG/250ML-% IV SOLN
INTRAVENOUS | Status: DC | PRN
  Administered 2020-09-18: 50 ug/min via INTRAVENOUS

## 2020-09-18 MED ORDER — PHENYLEPHRINE 40 MCG/ML (10ML) SYRINGE FOR IV PUSH (FOR BLOOD PRESSURE SUPPORT)
PREFILLED_SYRINGE | INTRAVENOUS | Status: DC | PRN
  Administered 2020-09-18 (×2): 120 ug via INTRAVENOUS
  Administered 2020-09-18: 160 ug via INTRAVENOUS

## 2020-09-18 MED ORDER — LACTATED RINGERS IV BOLUS
1000.0000 mL | Freq: Once | INTRAVENOUS | Status: AC
  Administered 2020-09-18: 01:00:00 1000 mL via INTRAVENOUS

## 2020-09-18 MED ORDER — SODIUM CHLORIDE 0.9 % IR SOLN
Status: DC | PRN
  Administered 2020-09-18: 3000 mL

## 2020-09-18 MED ORDER — INSULIN ASPART 100 UNIT/ML ~~LOC~~ SOLN
0.0000 [IU] | SUBCUTANEOUS | Status: DC
  Administered 2020-09-18 (×2): 5 [IU] via SUBCUTANEOUS
  Administered 2020-09-18: 09:00:00 3 [IU] via SUBCUTANEOUS
  Administered 2020-09-19: 08:00:00 2 [IU] via SUBCUTANEOUS
  Administered 2020-09-19: 16:00:00 5 [IU] via SUBCUTANEOUS
  Administered 2020-09-19: 12:00:00 2 [IU] via SUBCUTANEOUS
  Administered 2020-09-19: 3 [IU] via SUBCUTANEOUS
  Administered 2020-09-19: 2 [IU] via SUBCUTANEOUS
  Administered 2020-09-19: 21:00:00 3 [IU] via SUBCUTANEOUS
  Administered 2020-09-19: 05:00:00 2 [IU] via SUBCUTANEOUS
  Administered 2020-09-20: 08:00:00 3 [IU] via SUBCUTANEOUS
  Filled 2020-09-18: qty 0.15

## 2020-09-18 MED ORDER — DEXTROSE 50 % IV SOLN: 0.0000 mL | INTRAVENOUS | Status: DC | PRN

## 2020-09-18 MED ORDER — AMIODARONE LOAD VIA INFUSION
150.0000 mg | Freq: Once | INTRAVENOUS | Status: AC
  Administered 2020-09-18: 07:00:00 150 mg via INTRAVENOUS
  Filled 2020-09-18: qty 83.34

## 2020-09-18 MED ORDER — NOREPINEPHRINE 4 MG/250ML-% IV SOLN
0.0000 ug/min | INTRAVENOUS | Status: DC
Start: 2020-09-18 — End: 2020-09-18

## 2020-09-18 SURGICAL SUPPLY — 13 items
BAG URO CATCHER STRL LF (MISCELLANEOUS) ×2 IMPLANT
CATH INTERMIT  6FR 70CM (CATHETERS) ×2 IMPLANT
CLOTH BEACON ORANGE TIMEOUT ST (SAFETY) ×2 IMPLANT
GLOVE BIOGEL M STRL SZ7.5 (GLOVE) ×2 IMPLANT
GOWN STRL REUS W/TWL LRG LVL3 (GOWN DISPOSABLE) ×4 IMPLANT
GUIDEWIRE STR DUAL SENSOR (WIRE) ×2 IMPLANT
GUIDEWIRE ZIPWRE .038 STRAIGHT (WIRE) IMPLANT
KIT TURNOVER KIT A (KITS) IMPLANT
MANIFOLD NEPTUNE II (INSTRUMENTS) ×2 IMPLANT
PACK CYSTO (CUSTOM PROCEDURE TRAY) ×2 IMPLANT
STENT URET 6FRX26 CONTOUR (STENTS) ×2 IMPLANT
TUBING CONNECTING 10 (TUBING) ×2 IMPLANT
TUBING UROLOGY SET (TUBING) IMPLANT

## 2020-09-18 NOTE — Progress Notes (Signed)
PHARMACY - PHYSICIAN COMMUNICATION CRITICAL VALUE ALERT - BLOOD CULTURE IDENTIFICATION (BCID)  Edward Wells is an 74 y.o. male who presented to [facility] on 09/17/2020 with a chief complaint of septic shock from UTI  Assessment:  E coli in 1/4 BCx bottles (suspect urinary source)  Name of physician (or Provider) Contacted: [place]  Current antibiotics: Cefepime  Changes to prescribed antibiotics recommended: Narrow to Rocephin (shock resolved) Recommendations accepted by provider  Results for orders placed or performed during the hospital encounter of 09/17/20  Blood Culture ID Panel (Reflexed) (Collected: 09/17/2020 11:14 PM)  Result Value Ref Range   Enterococcus faecalis NOT DETECTED NOT DETECTED   Enterococcus Faecium NOT DETECTED NOT DETECTED   Listeria monocytogenes NOT DETECTED NOT DETECTED   Staphylococcus species NOT DETECTED NOT DETECTED   Staphylococcus aureus (BCID) NOT DETECTED NOT DETECTED   Staphylococcus epidermidis NOT DETECTED NOT DETECTED   Staphylococcus lugdunensis NOT DETECTED NOT DETECTED   Streptococcus species NOT DETECTED NOT DETECTED   Streptococcus agalactiae NOT DETECTED NOT DETECTED   Streptococcus pneumoniae NOT DETECTED NOT DETECTED   Streptococcus pyogenes NOT DETECTED NOT DETECTED   A.calcoaceticus-baumannii NOT DETECTED NOT DETECTED   Bacteroides fragilis NOT DETECTED NOT DETECTED   Enterobacterales DETECTED (A) NOT DETECTED   Enterobacter cloacae complex NOT DETECTED NOT DETECTED   Escherichia coli DETECTED (A) NOT DETECTED   Klebsiella aerogenes NOT DETECTED NOT DETECTED   Klebsiella oxytoca NOT DETECTED NOT DETECTED   Klebsiella pneumoniae NOT DETECTED NOT DETECTED   Proteus species NOT DETECTED NOT DETECTED   Salmonella species NOT DETECTED NOT DETECTED   Serratia marcescens NOT DETECTED NOT DETECTED   Haemophilus influenzae NOT DETECTED NOT DETECTED   Neisseria meningitidis NOT DETECTED NOT DETECTED   Pseudomonas aeruginosa NOT  DETECTED NOT DETECTED   Stenotrophomonas maltophilia NOT DETECTED NOT DETECTED   Candida albicans NOT DETECTED NOT DETECTED   Candida auris NOT DETECTED NOT DETECTED   Candida glabrata NOT DETECTED NOT DETECTED   Candida krusei NOT DETECTED NOT DETECTED   Candida parapsilosis NOT DETECTED NOT DETECTED   Candida tropicalis NOT DETECTED NOT DETECTED   Cryptococcus neoformans/gattii NOT DETECTED NOT DETECTED   CTX-M ESBL NOT DETECTED NOT DETECTED   Carbapenem resistance IMP NOT DETECTED NOT DETECTED   Carbapenem resistance KPC NOT DETECTED NOT DETECTED   Carbapenem resistance NDM NOT DETECTED NOT DETECTED   Carbapenem resist OXA 48 LIKE NOT DETECTED NOT DETECTED   Carbapenem resistance VIM NOT DETECTED NOT DETECTED    [name] A 09/18/2020  6:30 PM

## 2020-09-18 NOTE — Anesthesia Procedure Notes (Signed)
Procedure Name: Intubation Date/Time: 09/18/2020 11:57 AM Performed by: West Pugh, CRNA Pre-anesthesia Checklist: Patient identified, Emergency Drugs available, Suction available, Patient being monitored and Timeout performed Patient Re-evaluated:Patient Re-evaluated prior to induction Oxygen Delivery Method: Circle system utilized Preoxygenation: Pre-oxygenation with 100% oxygen Induction Type: IV induction, Rapid sequence and Cricoid Pressure applied Laryngoscope Size: Mac and 3 Grade View: Grade II Tube type: Oral Tube size: 7.5 mm Number of attempts: 1 Airway Equipment and Method: Stylet Placement Confirmation: ETT inserted through vocal cords under direct vision,  positive ETCO2,  CO2 detector and breath sounds checked- equal and bilateral Secured at: 22 cm Tube secured with: Tape Dental Injury: Teeth and Oropharynx as per pre-operative assessment

## 2020-09-18 NOTE — ED Notes (Signed)
Niece, Magdalene Molly, would like to be contacted after surgery. Phone # [phone]

## 2020-09-18 NOTE — ED Notes (Signed)
Spoke with Magda Paganini, patient's niece in [place]. Advised her of current pt's condition. Niece will be flying from [place] tomorrow to be here with patient. She should be here around 5p

## 2020-09-18 NOTE — ED Notes (Signed)
Patient has been transported to CT Scan

## 2020-09-18 NOTE — Anesthesia Preprocedure Evaluation (Signed)
Anesthesia Evaluation  Patient identified by MRN, date of birth, ID band Patient awake  General Assessment Comment:urosepsis  Reviewed: Allergy & Precautions, H&P , NPO status , Patient's Chart, lab work & pertinent test results  Airway Mallampati: II  TM Distance: >3 FB Neck ROM: Full    Dental no notable dental hx.    Pulmonary neg pulmonary ROS, former smoker,    Pulmonary exam normal breath sounds clear to auscultation       Cardiovascular hypertension, Normal cardiovascular exam Rhythm:Regular Rate:Normal     Neuro/Psych negative neurological ROS  negative psych ROS   GI/Hepatic negative GI ROS, Neg liver ROS,   Endo/Other  negative endocrine ROS  Renal/GU ARFRenal disease  negative genitourinary   Musculoskeletal negative musculoskeletal ROS (+)   Abdominal   Peds negative pediatric ROS (+)  Hematology thrombocytopenic   Anesthesia Other Findings   Reproductive/Obstetrics negative OB ROS                             Anesthesia Physical Anesthesia Plan  ASA: III and emergent  Anesthesia Plan: General   Post-op Pain Management:    Induction: Intravenous  PONV Risk Score and Plan: 2 and Ondansetron, Dexamethasone and Treatment may vary due to age or medical condition  Airway Management Planned: LMA  Additional Equipment:   Intra-op Plan:   Post-operative Plan: Extubation in OR  Informed Consent: I have reviewed the patients History and Physical, chart, labs and discussed the procedure including the risks, benefits and alternatives for the proposed anesthesia with the patient or authorized representative who has indicated his/her understanding and acceptance.     Dental advisory given  Plan Discussed with: CRNA and Surgeon  Anesthesia Plan Comments:         Anesthesia Quick Evaluation

## 2020-09-18 NOTE — Progress Notes (Signed)
Pharmacy Antibiotic Note  Edward Wells is a 74 y.o. male admitted on 09/17/2020 with septic shock secondary to pyelonephritis.  Pharmacy has been consulted for Cefepime dosing.  Plan: Cefepime 2g IV q24h Monitor renal function, cultures, clinical course  Height: 6' (182.9 cm) Weight: 103.9 kg (229 lb) IBW/kg (Calculated) : 77.6  Temp (24hrs), Avg:97.5 F (36.4 C), Min:95 F (35 C), Max:99.8 F (37.7 C)  Recent Labs  Lab 09/17/20 0320 09/17/20 2314 09/18/20 0929  WBC  --  47.3*  --   CREATININE  --  3.61* 3.03*  LATICACIDVEN 2.0* 3.1* 1.8    Estimated Creatinine Clearance: 27.5 mL/min (A) (by C-G formula based on SCr of 3.03 mg/dL (H)).    Allergies  Allergen Reactions  . Furosemide     Muscle cramps    Antimicrobials this admission: 12/15 Vancomycin x 1 12/15 Cefepime >>  Dose adjustments this admission: --  Microbiology results: 12/14 BCx: sent 12/14 Respiratory panel: COVID negative, Influenza A/B negative 12/15 UCx: sent    Thank you for allowing pharmacy to be a part of this patient's care.   Lindell Spar, PharmD, BCPS Clinical Pharmacist  09/18/2020 11:08 AM

## 2020-09-18 NOTE — ED Notes (Signed)
Pt rectal temp 99.8 Bair hugger removed and pt covered in blanket

## 2020-09-18 NOTE — ED Notes (Addendum)
Pt. istat-Chem 8 results potassium greater than 8.5. See orders above. Notified PA, Mia.

## 2020-09-18 NOTE — Progress Notes (Signed)
IR received request for right PCN placement however patient was taken to the OR with Dr. Alinda Money and a stent was placed. IR order will be deleted.  Please call IR with any questions.  Soyla Dryer, [place] [phone] 09/18/2020, 1:38 PM

## 2020-09-18 NOTE — Transfer of Care (Signed)
Immediate Anesthesia Transfer of Care Note  Patient: Edward Wells  Procedure(s) Performed: CYSTOSCOPY WITH RETROGRADE PYELOGRAM/URETERAL STENT PLACEMENT (Right )  Patient Location: PACU  Anesthesia Type:General  Level of Consciousness: awake, alert  and patient cooperative  Airway & Oxygen Therapy: Patient Spontanous Breathing and Patient connected to face mask oxygen  Post-op Assessment: Report given to RN and Post -op Vital signs reviewed and stable  Post vital signs: Reviewed and stable  Last Vitals:  Vitals Value Taken Time  BP 102/38 09/18/20 1221  Temp    Pulse    Resp 17 09/18/20 1226  SpO2    Vitals shown include unvalidated device data.  Last Pain:  Vitals:   09/18/20 1103  TempSrc: Oral  PainSc:          Complications: No complications documented.

## 2020-09-18 NOTE — ED Notes (Signed)
Patient placed on Bair Hugger.

## 2020-09-18 NOTE — H&P (Addendum)
NAME:  Edward Wells, MRN:  [id number], DOB:  1945-10-19, LOS: 0 ADMISSION DATE:  09/17/2020, CONSULTATION DATE:  09/18/2020 REFERRING MD:  Shel[facility] ED, CHIEF COMPLAINT:  Septic shock   HPI/course in hospital  74 year old man who presented with hypotension.   EMS dispatched as part of welfare check as patient did not answer neighbors' calls.   [place] floor with a trail of diarrhea from bathroom to where is was lying. Initially. Patient was initally hypotensive 88/40 and lethargic. NS 1516ml bolus given. Patient more alert and oriented and BP 120/80.   In ED, found to be hypothermic and Code Sepsis called. Lactate elevated at 3.1.  Given 4.5L of fluids - improved BP, but remains tachycardic.  Hyperglycemic with new AKI (baseline Creatinine 1.10 06/21)  CT abdomen shows multiple stones on right side with moderate hydronephrosis.  Past Medical History   Past Medical History:  Diagnosis Date  . Acute gastric ulcer with hemorrhage 07/30/2014  . Hypertension      Past Surgical History:  Procedure Laterality Date  . COLONOSCOPY    . ESOPHAGOGASTRODUODENOSCOPY N/A 07/31/2014   Procedure: ESOPHAGOGASTRODUODENOSCOPY (EGD);  Surgeon: Gatha Mayer, MD;  Location: [facility] ENDOSCOPY;  Service: Endoscopy;  Laterality: N/A;  . ganglion cyst wrist  1971   right, x 2.  . HAMMER TOE SURGERY  1984   bilateral  . KNEE ARTHROSCOPY     left, 2004; right, 2001     Review of Systems:   Review of Systems  Constitutional: Positive for malaise/fatigue.  HENT: Negative.   Eyes: Negative.   Respiratory: Negative.   Cardiovascular: Negative.   Gastrointestinal: Positive for abdominal pain and diarrhea.  Genitourinary: Negative.   Musculoskeletal: Negative.   Neurological: Negative.   Endo/Heme/Allergies: Negative.   Psychiatric/Behavioral: Negative.     Social History   reports that he has quit smoking. He started smoking about 1 years ago. He has never used smokeless tobacco. He reports  previous alcohol use. He reports that he does not use drugs.   Family History   His family history includes Hypertension in his mother. There is no history of Colon cancer, Stomach cancer, Esophageal cancer, or Rectal cancer.   Allergies Allergies  Allergen Reactions  . Furosemide     Muscle cramps     Home Medications  Prior to Admission medications   Medication Sig Start Date End Date Taking? Authorizing Provider  acetaminophen (TYLENOL) 325 MG tablet Take 975 mg by mouth daily as needed for mild pain.   Yes [provider]  Coenzyme Q10 (CO Q 10) 100 MG CAPS Take 100 mg by mouth daily.   Yes [provider]  losartan (COZAAR) 50 MG tablet Take 50 mg by mouth daily. 01/18/20  Yes [provider]  Multiple Vitamin (MULTIVITAMIN) tablet Take 1 tablet by mouth daily.   Yes [provider]  tiZANidine (ZANAFLEX) 2 MG tablet Take 1 tablet by mouth 3 (three) times daily as needed for muscle spasms. 02/15/20  Yes [provider]  cefdinir (OMNICEF) 300 MG capsule Take 1 capsule (300 mg total) by mouth 2 (two) times daily. Patient not taking: Reported on 09/18/2020 03/11/20   Orpah Greek, MD     Interim history/subjective:  Complaining of abdominal pain.   Objective   Blood pressure 103/65, pulse (!) 143, temperature (!) 96.3 F (35.7 C), temperature source Oral, resp. rate 18, SpO2 97 %.        Intake/Output Summary (Last 24 hours) at  09/18/2020 0454 Last data filed at 09/18/2020 0230 Gross per 24 hour  Intake 4103.91 ml  Output --  Net 4103.91 ml   There were no vitals filed for this visit.  Examination:  Physical Exam Constitutional:      General: He is in acute distress.     Appearance: He is underweight. He is toxic-appearing.     Comments: In pain  HENT:     Head: Normocephalic and atraumatic.  Eyes:     General: No scleral icterus.    Conjunctiva/sclera: Conjunctivae normal.  Neck:     Trachea: Trachea normal.   Cardiovascular:     Rate and Rhythm: Regular rhythm. Tachycardia present.     Heart sounds: Normal heart sounds.     Comments: JVP flat Pulmonary:     Breath sounds: Normal breath sounds.  Abdominal:     Palpations: Abdomen is soft.     Tenderness: There is abdominal tenderness in the right upper quadrant. There is right CVA tenderness.  Genitourinary:    Comments: Condom catheter with clear urine Musculoskeletal:     Right lower leg: No edema.     Left lower leg: No edema.  Neurological:     Mental Status: He is alert and oriented to person, place, and time.  Psychiatric:        Attention and Perception: He is inattentive.        Speech: Speech normal.        Behavior: Behavior is cooperative.        Thought Content: Thought content normal.     Comments: Mild inattention, unable to answer more complex question    Ancillary tests (personally reviewed)  CBC: Recent Labs  Lab 09/17/20 2314  WBC 47.3*  NEUTROABS 43.9*  HGB 13.8  HCT 41.6  MCV 81.3  PLT 50*    Basic Metabolic Panel: Recent Labs  Lab 09/17/20 2314  NA 146*  K 3.9  CL 114*  CO2 18*  GLUCOSE 308*  BUN 211*  CREATININE 3.61*  CALCIUM 7.7*  MG 3.3*   GFR: CrCl cannot be calculated (Unknown ideal weight.). Recent Labs  Lab 09/17/20 0320 09/17/20 2314  WBC  --  47.3*  LATICACIDVEN 2.0* 3.1*    Liver Function Tests: Recent Labs  Lab 09/17/20 2314  AST 13*  ALT 20  ALKPHOS 101  BILITOT 0.8  PROT 6.0*  ALBUMIN 2.4*   Recent Labs  Lab 09/17/20 2314  LIPASE 35   No results for input(s): AMMONIA in the last 168 hours.  ABG No results found for: PHART, PCO2ART, PO2ART, HCO3, TCO2, ACIDBASEDEF, O2SAT   Coagulation Profile: No results for input(s): INR, PROTIME in the last 168 hours.  Cardiac Enzymes: No results for input(s): CKTOTAL, CKMB, CKMBINDEX, TROPONINI in the last 168 hours.  HbA1C: Hgb A1c MFr Bld  Date/Time Value Ref Range Status  02/16/2019 04:57 PM 5.7 (H) 4.8 -  5.6 % Final    Comment:    (NOTE) Pre diabetes:          5.7%-6.4% Diabetes:              >6.4% Glycemic control for   <7.0% adults with diabetes     CBG: No results for input(s): GLUCAP in the last 168 hours.   Assessment & Plan:  Critically ill due severe sepsis with signs of hypoperfusion. Sepsis due to pyelonephritis due to obstructing nephrolithiasis. Critically ill due to atrial fibrillation requiring amiodarone infusion Acute kidney injury - history and BUN/creatinine  ratio of 58 consistent with pre-renal injury component.  Non-anion gap acidosis Leukocytosis with leukemoid reaction Hyperglycemia  Plan:  Continue fluid resuscitation as remains volume contracted Repeat lactate Initiate pressors if MAP<65, lactate fails to clear Amiodarone infusion Continue current antibiotics pending cultures IV insulin  Follow creatinine  Needs nephrostomy tube - ordered, keep NPO Urology consultation in am.  Analgesia  Daily Goals Checklist  Pain/Anxiety/Delirium protocol (if indicated): dilaudid prn VAP protocol (if indicated): not intubated Respiratory support goals: O2 to keep sats >90% Blood pressure target: MAP>65 DVT prophylaxis: heparin tid Nutritional status and feeding goals: NPO  GI prophylaxis: not indicated Fluid status goals: Continue fluid resuscitation Urinary catheter: condom catheter Central lines: PIV Glucose control: phase III Mobility/therapy needs: ad lib Antibiotic de-escalation: continue empiric antibiotics. Home medication reconciliation: on hold Daily labs: CBC, BMET daily  Code Status: full code Family Communication: Niece informed by ED physician - she is coming in from [place] Disposition: ICU  CRITICAL CARE Performed by: Kipp Brood   Total critical care time: 60 minutes  Critical care time was exclusive of separately billable procedures and treating other patients.  Critical care was necessary to treat or prevent imminent or  life-threatening deterioration.  Critical care was time spent personally by me on the following activities: development of treatment plan with patient and/or surrogate as well as nursing, discussions with consultants, evaluation of patient's response to treatment, examination of patient, obtaining history from patient or surrogate, ordering and performing treatments and interventions, ordering and review of laboratory studies, ordering and review of radiographic studies, pulse oximetry, re-evaluation of patient's condition and participation in multidisciplinary rounds.  Kipp Brood, MD [facility]  Pager: [phone] Mobile: [phone] After hours: [phone].   09/18/2020, 4:54 AM

## 2020-09-18 NOTE — Consult Note (Signed)
Urology Consult   Physician requesting consult: Dr. Lake Bells  Reason for consult: Right ureteral stones and sepsis  History of Present Illness: Edward Wells is a 74 y.o. who presented to the ED with systemic infection and fever.  CT imaging revealed multiple obstructing right ureteral stones.  Currently he is febrile with hypotension and is being started on vasopressor agents.  Platelet count is decreased at 50,000 possibly secondary to DIC (panel pending).  He states he has noticed some right flank pain along with nausea and vomiting the past few days.  Also with AKI with Cr of 3.6.   Past Medical History:  Diagnosis Date  . Acute gastric ulcer with hemorrhage 07/30/2014  . Hypertension     Past Surgical History:  Procedure Laterality Date  . COLONOSCOPY    . ESOPHAGOGASTRODUODENOSCOPY N/A 07/31/2014   Procedure: ESOPHAGOGASTRODUODENOSCOPY (EGD);  Surgeon: Gatha Mayer, MD;  Location: [facility] ENDOSCOPY;  Service: Endoscopy;  Laterality: N/A;  . ganglion cyst wrist  1971   right, x 2.  . HAMMER TOE SURGERY  1984   bilateral  . KNEE ARTHROSCOPY     left, 2004; right, 2001    Current Hospital Medications:  Home Meds:  No current facility-administered medications on file prior to encounter.   Current Outpatient Medications on File Prior to Encounter  Medication Sig Dispense Refill  . acetaminophen (TYLENOL) 325 MG tablet Take 975 mg by mouth daily as needed for mild pain.    . Coenzyme Q10 (CO Q 10) 100 MG CAPS Take 100 mg by mouth daily.    Marland Kitchen losartan (COZAAR) 50 MG tablet Take 50 mg by mouth daily.    . Multiple Vitamin (MULTIVITAMIN) tablet Take 1 tablet by mouth daily.    Marland Kitchen tiZANidine (ZANAFLEX) 2 MG tablet Take 1 tablet by mouth 3 (three) times daily as needed for muscle spasms.    . cefdinir (OMNICEF) 300 MG capsule Take 1 capsule (300 mg total) by mouth 2 (two) times daily. (Patient not taking: Reported on 09/18/2020) 28 capsule 0     Scheduled Meds: . insulin aspart   0-15 Units Subcutaneous Q4H   Continuous Infusions: . sodium chloride    . amiodarone 60 mg/hr (09/18/20 0704)   Followed by  . amiodarone    . lactated ringers 150 mL/hr at 09/18/20 0010  . lactated ringers    . norepinephrine (LEVOPHED) Adult infusion     PRN Meds:.HYDROmorphone (DILAUDID) injection, ondansetron (ZOFRAN) IV  Allergies:  Allergies  Allergen Reactions  . Furosemide     Muscle cramps    Family History  Problem Relation Age of Onset  . Hypertension Mother   . Colon cancer Neg Hx   . Stomach cancer Neg Hx   . Esophageal cancer Neg Hx   . Rectal cancer Neg Hx     Social History:  reports that he has quit smoking. He started smoking about 1 years ago. He has never used smokeless tobacco. He reports previous alcohol use. He reports that he does not use drugs.  ROS: A complete review of systems was performed.  All systems are negative except for pertinent findings as noted.  Physical Exam:  Vital signs in last 24 hours: Temp:  [95 F (35 C)-99.8 F (37.7 C)] 99.8 F (37.7 C) (12/15 0615) Pulse Rate:  [101-160] 101 (12/15 0900) Resp:  [14-31] 24 (12/15 0900) BP: (81-123)/(50-81) 81/50 (12/15 0900) SpO2:  [95 %-100 %] 95 % (12/15 0900) Constitutional:  Alert and oriented, No acute distress Cardiovascular:  Regular rate and rhythm, No JVD Respiratory: Normal respiratory effort, Lungs clear bilaterally GI: Abdomen is soft, nondistended, moderate R abdominal tenderness GU: Moderate right CVA tenderness Lymphatic: No lymphadenopathy Neurologic: Grossly intact, no focal deficits Psychiatric: Normal mood and affect  Laboratory Data:  Recent Labs    09/17/20 2314  WBC 47.3*  HGB 13.8  HCT 41.6  PLT 50*    Recent Labs    09/17/20 2314  NA 146*  K 3.9  CL 114*  GLUCOSE 308*  BUN 211*  CALCIUM 7.7*  CREATININE 3.61*     Results for orders placed or performed during the hospital encounter of 09/17/20 (from the past 24 hour(s))  Lactic acid,  plasma     Status: Abnormal   Collection Time: 09/17/20 11:14 PM  Result Value Ref Range   Lactic Acid, Venous 3.1 (HH) 0.5 - 1.9 mmol/L  Comprehensive metabolic panel     Status: Abnormal   Collection Time: 09/17/20 11:14 PM  Result Value Ref Range   Sodium 146 (H) 135 - 145 mmol/L   Potassium 3.9 3.5 - 5.1 mmol/L   Chloride 114 (H) 98 - 111 mmol/L   CO2 18 (L) 22 - 32 mmol/L   Glucose, Bld 308 (H) 70 - 99 mg/dL   BUN 211 (H) 8 - 23 mg/dL   Creatinine, Ser 3.61 (H) 0.61 - 1.24 mg/dL   Calcium 7.7 (L) 8.9 - 10.3 mg/dL   Total Protein 6.0 (L) 6.5 - 8.1 g/dL   Albumin 2.4 (L) 3.5 - 5.0 g/dL   AST 13 (L) 15 - 41 U/L   ALT 20 0 - 44 U/L   Alkaline Phosphatase 101 38 - 126 U/L   Total Bilirubin 0.8 0.3 - 1.2 mg/dL   GFR, Estimated 17 (L) >60 mL/min   Anion gap 14 5 - 15  CBC WITH DIFFERENTIAL     Status: Abnormal   Collection Time: 09/17/20 11:14 PM  Result Value Ref Range   WBC 47.3 (H) 4.0 - 10.5 K/uL   RBC 5.12 4.22 - 5.81 MIL/uL   Hemoglobin 13.8 13.0 - 17.0 g/dL   HCT 41.6 39.0 - 52.0 %   MCV 81.3 80.0 - 100.0 fL   MCH 27.0 26.0 - 34.0 pg   MCHC 33.2 30.0 - 36.0 g/dL   RDW 14.6 11.5 - 15.5 %   Platelets 50 (L) 150 - 400 K/uL   nRBC 0.0 0.0 - 0.2 %   Neutrophils Relative % 93 %   Neutro Abs 43.9 (H) 1.7 - 7.7 K/uL   Lymphocytes Relative 3 %   Lymphs Abs 1.2 0.7 - 4.0 K/uL   Monocytes Relative 2 %   Monocytes Absolute 1.0 0.1 - 1.0 K/uL   Eosinophils Relative 0 %   Eosinophils Absolute 0.2 0.0 - 0.5 K/uL   Basophils Relative 0 %   Basophils Absolute 0.0 0.0 - 0.1 K/uL   Immature Granulocytes 2 %   Abs Immature Granulocytes 0.96 (H) 0.00 - 0.07 K/uL   Reactive, Benign Lymphocytes PRESENT   Lipase, blood     Status: None   Collection Time: 09/17/20 11:14 PM  Result Value Ref Range   Lipase 35 11 - 51 U/L  Magnesium     Status: Abnormal   Collection Time: 09/17/20 11:14 PM  Result Value Ref Range   Magnesium 3.3 (H) 1.7 - 2.4 mg/dL  Resp Panel by RT-PCR (Flu A&B,  Covid) Nasopharyngeal Swab     Status: None   Collection Time: 09/17/20  11:37 PM   Specimen: Nasopharyngeal Swab; Nasopharyngeal(NP) swabs in vial transport medium  Result Value Ref Range   SARS Coronavirus 2 by RT PCR NEGATIVE NEGATIVE   Influenza A by PCR NEGATIVE NEGATIVE   Influenza B by PCR NEGATIVE NEGATIVE  Urinalysis, Routine w reflex microscopic Urine, Clean Catch     Status: Abnormal   Collection Time: 09/18/20 12:40 AM  Result Value Ref Range   Color, Urine YELLOW YELLOW   APPearance CLEAR CLEAR   Specific Gravity, Urine 1.013 1.005 - 1.030   pH 5.0 5.0 - 8.0   Glucose, UA NEGATIVE NEGATIVE mg/dL   Hgb urine dipstick MODERATE (A) NEGATIVE   Bilirubin Urine NEGATIVE NEGATIVE   Ketones, ur NEGATIVE NEGATIVE mg/dL   Protein, ur NEGATIVE NEGATIVE mg/dL   Nitrite POSITIVE (A) NEGATIVE   Leukocytes,Ua SMALL (A) NEGATIVE   RBC / HPF 6-10 0 - 5 RBC/hpf   WBC, UA 11-20 0 - 5 WBC/hpf   Bacteria, UA MANY (A) NONE SEEN   Squamous Epithelial / LPF 0-5 0 - 5   Mucus PRESENT    Hyaline Casts, UA PRESENT   TSH     Status: None   Collection Time: 09/18/20  5:37 AM  Result Value Ref Range   TSH 1.542 0.350 - 4.500 uIU/mL  CBG monitoring, ED     Status: Abnormal   Collection Time: 09/18/20  6:46 AM  Result Value Ref Range   Glucose-Capillary 221 (H) 70 - 99 mg/dL  CBG monitoring, ED     Status: Abnormal   Collection Time: 09/18/20  8:26 AM  Result Value Ref Range   Glucose-Capillary 186 (H) 70 - 99 mg/dL   Recent Results (from the past 240 hour(s))  Resp Panel by RT-PCR (Flu A&B, Covid) Nasopharyngeal Swab     Status: None   Collection Time: 09/17/20 11:37 PM   Specimen: Nasopharyngeal Swab; Nasopharyngeal(NP) swabs in vial transport medium  Result Value Ref Range Status   SARS Coronavirus 2 by RT PCR NEGATIVE NEGATIVE Final    Comment: (NOTE) SARS-CoV-2 target nucleic acids are NOT DETECTED.  The SARS-CoV-2 RNA is generally detectable in upper respiratory specimens during  the acute phase of infection. The lowest concentration of SARS-CoV-2 viral copies this assay can detect is 138 copies/mL. A negative result does not preclude SARS-Cov-2 infection and should not be used as the sole basis for treatment or other patient management decisions. A negative result may occur with  improper specimen collection/handling, submission of specimen other than nasopharyngeal swab, presence of viral mutation(s) within the areas targeted by this assay, and inadequate number of viral copies(<138 copies/mL). A negative result must be combined with clinical observations, patient history, and epidemiological information. The expected result is Negative.  Fact Sheet for Patients:  EntrepreneurPulse.com.au  Fact Sheet for Healthcare Providers:  IncredibleEmployment.be  This test is no t yet approved or cleared by the [place] FDA and  has been authorized for detection and/or diagnosis of SARS-CoV-2 by FDA under an Emergency Use Authorization (EUA). This EUA will remain  in effect (meaning this test can be used) for the duration of the COVID-19 declaration under Section 564(b)(1) of the Act, 21 U.S.C.section 360bbb-3(b)(1), unless the authorization is terminated  or revoked sooner.       Influenza A by PCR NEGATIVE NEGATIVE Final   Influenza B by PCR NEGATIVE NEGATIVE Final    Comment: (NOTE) The Xpert Xpress SARS-CoV-2/FLU/RSV plus assay is intended as an aid in the diagnosis of influenza from  Nasopharyngeal swab specimens and should not be used as a sole basis for treatment. Nasal washings and aspirates are unacceptable for Xpert Xpress SARS-CoV-2/FLU/RSV testing.  Fact Sheet for Patients: EntrepreneurPulse.com.au  Fact Sheet for Healthcare Providers: IncredibleEmployment.be  This test is not yet approved or cleared by the [place] FDA and has been authorized for detection and/or  diagnosis of SARS-CoV-2 by FDA under an Emergency Use Authorization (EUA). This EUA will remain in effect (meaning this test can be used) for the duration of the COVID-19 declaration under Section 564(b)(1) of the Act, 21 U.S.C. section 360bbb-3(b)(1), unless the authorization is terminated or revoked.  Performed at [facility], [place] [address], [place], [place] [address]     Renal Function: Recent Labs    09/17/20 2314  CREATININE 3.61*   CrCl cannot be calculated (Unknown ideal weight.).  Radiologic Imaging: CT ABDOMEN PELVIS WO CONTRAST  Result Date: 09/18/2020 CLINICAL DATA:  Nausea, vomiting, leukocytosis, altered mental status EXAM: CT ABDOMEN AND PELVIS WITHOUT CONTRAST TECHNIQUE: Multidetector CT imaging of the abdomen and pelvis was performed following the standard protocol without IV contrast. COMPARISON:  None. FINDINGS: Lower chest: Mild bibasilar pulmonary scarring. Cardiac size within normal limits. Trace pericardial fluid is likely physiologic. Hepatobiliary: Multiple simple cysts are seen scattered within the liver. The liver is otherwise unremarkable. Gallbladder unremarkable. No intra or extrahepatic biliary ductal dilation. Pancreas: Unremarkable Spleen: Unremarkable Adrenals/Urinary Tract: The adrenal glands are unremarkable. The kidneys are normal in size and position. There is moderate right hydronephrosis with 3 obstructing calculi noted within the proximal right ureter measuring are 7 x 9 x 13 mm inferiorly, 3 mm in the middle, and 8 x 10 x 15 mm superiorly. There is superimposed bilateral nonobstructing nephrolithiasis, right greater than left, with multiple calculi measuring up to 7 mm. Multiple simple cortical cysts are seen within the kidneys bilaterally. No hydronephrosis on the left. A punctate 2 mm calculus is seen leg dependently within the bladder lumen. The bladder is otherwise unremarkable. Stomach/Bowel: Moderate pancolonic  diverticulosis. The stomach, small bowel, and large bowel are otherwise unremarkable. No evidence of obstruction or focal inflammation. Appendix normal. No free intraperitoneal gas or fluid. Vascular/Lymphatic: Mild aortoiliac atherosclerotic calcification. No aortic aneurysm. No pathologic adenopathy within the abdomen and pelvis. Reproductive: Prostate is unremarkable. Other: Small bilateral fat containing inguinal hernias. Rectum unremarkable. Musculoskeletal: No acute bone abnormality. Degenerative changes are seen within the lumbar spine. IMPRESSION: At least 3 obstructing right proximal ureteral calculi resulting in moderate right hydronephrosis. Calculi measure up to 13 mm and 15 mm in greatest dimension. Superimposed moderate bilateral nonobstructing nephrolithiasis. 2 mm punctate calculus within the bladder lumen. Pancolonic moderate diverticulosis. Aortic Atherosclerosis (ICD10-I70.0). Electronically Signed   By: Fidela Salisbury MD   On: 09/18/2020 03:01   DG Chest Port 1 View  Result Date: 09/17/2020 CLINICAL DATA:  Possible sepsis EXAM: PORTABLE CHEST 1 VIEW COMPARISON:  02/16/2019 FINDINGS: The heart size and mediastinal contours are within normal limits. Both lungs are clear. The visualized skeletal structures are unremarkable. IMPRESSION: No active disease. Electronically Signed   By: Donavan Foil M.D.   On: 09/17/2020 22:54    I independently reviewed the above imaging studies.  Impression/Recommendation Pt is critically ill with right pyelonephritis and right ureteral obstruction due to multiple ureteral calculi.  He is moderately stable and I do think operative intervention with stent placement would be with acceptable risk.  Considering his low platelet count, percutaneous nephrostomy would be associated with higher risk of  bleeding.  Will tentatively plan for right ureteral stent placement urgently.  If not possible, will then consider platelet transfusion and nephrostomy placement. I  discussed the potential benefits and risks of the procedure, side effects of the proposed treatment, the likelihood of the patient achieving the goals of the procedure, and any potential problems that might occur during the procedure or recuperation.   Dutch Gray 09/18/2020, 10:04 AM    Pryor Curia MD   CC: Dr. Lake Bells

## 2020-09-18 NOTE — Plan of Care (Signed)
  Problem: Clinical Measurements: Goal: Ability to maintain clinical measurements within normal limits will improve Outcome: Progressing Goal: Will remain free from infection Outcome: Progressing Goal: Diagnostic test results will improve Outcome: Progressing Goal: Respiratory complications will improve Outcome: Progressing   Problem: Safety: Goal: Ability to remain free from injury will improve Outcome: Progressing

## 2020-09-18 NOTE — Progress Notes (Signed)
  Amiodarone Drug - Drug Interaction Consult Note  Recommendations: none Amiodarone is metabolized by the cytochrome P450 system and therefore has the potential to cause many drug interactions. Amiodarone has an average plasma half-life of 50 days (range 20 to 100 days).   There is potential for drug interactions to occur several weeks or months after stopping treatment and the onset of drug interactions may be slow after initiating amiodarone.   []  Statins: Increased risk of myopathy. Simvastatin- restrict dose to 20mg  daily. Other statins: counsel patients to report any muscle pain or weakness immediately.  []  Anticoagulants: Amiodarone can increase anticoagulant effect. Consider warfarin dose reduction. Patients should be monitored closely and the dose of anticoagulant altered accordingly, remembering that amiodarone levels take several weeks to stabilize.  []  Antiepileptics: Amiodarone can increase plasma concentration of phenytoin, the dose should be reduced. Note that small changes in phenytoin dose can result in large changes in levels. Monitor patient and counsel on signs of toxicity.  []  Beta blockers: increased risk of bradycardia, AV block and myocardial depression. Sotalol - avoid concomitant use.  []   Calcium channel blockers (diltiazem and verapamil): increased risk of bradycardia, AV block and myocardial depression.  []   Cyclosporine: Amiodarone increases levels of cyclosporine. Reduced dose of cyclosporine is recommended.  []  Digoxin dose should be halved when amiodarone is started.  []  Diuretics: increased risk of cardiotoxicity if hypokalemia occurs.  []  Oral hypoglycemic agents (glyburide, glipizide, glimepiride): increased risk of hypoglycemia. Patient's glucose levels should be monitored closely when initiating amiodarone therapy.   []  Drugs that prolong the QT interval:  Torsades de pointes risk may be increased with concurrent use - avoid if possible.  Monitor QTc,  also keep magnesium/potassium WNL if concurrent therapy can't be avoided. Marland Kitchen Antibiotics: e.g. fluoroquinolones, erythromycin. . Antiarrhythmics: e.g. quinidine, procainamide, disopyramide, sotalol. . Antipsychotics: e.g. phenothiazines, haloperidol.  . Lithium, tricyclic antidepressants, and methadone. Thank You,  Dolly Rias RPh 09/18/2020, 5:40 AM

## 2020-09-18 NOTE — Progress Notes (Signed)
NAME:  Edward Wells, MRN:  [id number], DOB:  Mar 24, 1946, LOS: 0 ADMISSION DATE:  09/17/2020, CONSULTATION DATE:  12/15 REFERRING MD:  Roxanne Mins, CHIEF COMPLAINT:  Found down   Brief History   74 y/o male brought to the ED on 12/14 after she was not answering her neighbor's calls, noted to be in septic shock due to pyelonephritis.    Past Medical History  Hypertension Acute gastric ulcer with hemorrhage  Significant Hospital Events     Consults:  urology  Procedures:    Significant Diagnostic Tests:  12/15 ct abdomen> 3 obstructing right proximal ureteral calculi causing moderate right hydronephrosis, pancolonic moderate diverticulosis  Micro Data:  12/14 sars cov2/flu > negative 12/14 blood culture > 12/14 urine culture >   Antimicrobials:  12/14 cefepime >   Interim history/subjective:  Feels a little better this morning Notes two weeks of dry mouth, polyuria Some diarrhea/vomiting at home   Objective   Blood pressure (!) 86/58, pulse (!) 133, temperature 99.8 F (37.7 C), temperature source Rectal, resp. rate (!) 21, SpO2 97 %.        Intake/Output Summary (Last 24 hours) at 09/18/2020 0812 Last data filed at 09/18/2020 4034 Gross per 24 hour  Intake 4203.47 ml  Output --  Net 4203.47 ml   There were no vitals filed for this visit.  Examination: General:  Resting comfortably in bed HENT: NCAT OP clear but mucus membranes dry PULM: CTA B, normal effort CV: RRR, no mgr GI: BS+, soft, nontender MSK: normal bulk and tone Neuro: awake, alert, oriented, [name] Problem list     Assessment & Plan:  Septic shock due to pyelonephritis> adequately volume resuscitated at this point Start levophed, titrated for MAP > 65 Repeat lactic acid Source control: consulted urology, they will discuss plan for nephrostomy vs intra-operative stent Cefepime to continue F/u cultures  Obstructive hydronephrosis from nephrolithiasis As above  AKI with  BUN 211 Monitor BMET and UOP Replace electrolytes as needed Continue LR at 100cc/hr  Thrombocytopenia: DIC? Check DIC panel Monitor for bleeding Transfuse PRBC for Hgb < 7 gm/dL  Hyperglycemia: New diagnosis of diabetes (history supports) Check Hgb A1c SSI, CBG q4h D/c insulin infusion  Best practice (evaluated daily)   Diet: npo Pain/Anxiety/Delirium protocol (if indicated): n/a VAP protocol (if indicated): n/a DVT prophylaxis: SCD GI prophylaxis: n/a Glucose control: as above Mobility: up ad lib last date of multidisciplinary goals of care discussion: hasn't happened yet Family and staff present   Summary of discussion hasn't happened yet Follow up goals of care discussion due 12/21 Code Status: full Disposition: admit to ICU  Labs   CBC: Recent Labs  Lab 09/17/20 2314  WBC 47.3*  NEUTROABS 43.9*  HGB 13.8  HCT 41.6  MCV 81.3  PLT 50*    Basic Metabolic Panel: Recent Labs  Lab 09/17/20 2314  NA 146*  K 3.9  CL 114*  CO2 18*  GLUCOSE 308*  BUN 211*  CREATININE 3.61*  CALCIUM 7.7*  MG 3.3*   GFR: CrCl cannot be calculated (Unknown ideal weight.). Recent Labs  Lab 09/17/20 0320 09/17/20 2314  WBC  --  47.3*  LATICACIDVEN 2.0* 3.1*    Liver Function Tests: Recent Labs  Lab 09/17/20 2314  AST 13*  ALT 20  ALKPHOS 101  BILITOT 0.8  PROT 6.0*  ALBUMIN 2.4*   Recent Labs  Lab 09/17/20 2314  LIPASE 35   No results for input(s): AMMONIA in the last 168 hours.  ABG No results found for: PHART, PCO2ART, PO2ART, HCO3, TCO2, ACIDBASEDEF, O2SAT   Coagulation Profile: No results for input(s): INR, PROTIME in the last 168 hours.  Cardiac Enzymes: No results for input(s): CKTOTAL, CKMB, CKMBINDEX, TROPONINI in the last 168 hours.  HbA1C: Hgb A1c MFr Bld  Date/Time Value Ref Range Status  02/16/2019 04:57 PM 5.7 (H) 4.8 - 5.6 % Final    Comment:    (NOTE) Pre diabetes:          5.7%-6.4% Diabetes:              >6.4% Glycemic  control for   <7.0% adults with diabetes     CBG: Recent Labs  Lab 09/18/20 0646  GLUCAP 221*     Critical care time: 40 minutes    Roselie Awkward, MD [place] Pager: [phone] Cell: [phone] If no response, call [phone]

## 2020-09-18 NOTE — Op Note (Signed)
Preoperative diagnosis:  1. Right ureteral calculi and sepsis   Postoperative diagnosis:  1. Right ureteral calculi and sepsis   Procedure:  1. Cystoscopy 2. Right ureteral stent placement (6 x 26 - no string)  Surgeon: Roxy Horseman, Brooke Bonito. M.D.  Anesthesia: General  Complications: None  EBL: Minimal  Specimens: None  Indication: Edward Wells is a 74 y.o. patient with right ureteral calculi and sepsis due to UTI. After reviewing the management options for treatment, he elected to proceed with the above surgical procedure(s). We have discussed the potential benefits and risks of the procedure, side effects of the proposed treatment, the likelihood of the patient achieving the goals of the procedure, and any potential problems that might occur during the procedure or recuperation. Informed consent has been obtained.  Description of procedure:  The patient was taken to the operating room and general anesthesia was induced.  The patient was placed in the dorsal lithotomy position, prepped and draped in the usual sterile fashion, and preoperative antibiotics were administered. A preoperative time-out was performed.   Cystourethroscopy was performed.  The patient's urethra was examined and was normal. The bladder was then systematically examined in its entirety. There was no evidence for any bladder tumors, stones, or other mucosal pathology.    A 0.38 sensor guidewire was then advanced up the right ureter into the renal pelvis under fluoroscopic guidance.  The wire was then backloaded through the cystoscope and a ureteral stent was advance over the wire using Seldinger technique.  The stent was positioned appropriately under fluoroscopic and cystoscopic guidance.  The wire was then removed with an adequate stent curl noted in the renal pelvis as well as in the bladder.  The bladder was then emptied and the procedure ended.  The patient appeared to tolerate the procedure well and without  complications.  The patient was able to be awakened and transferred to the recovery unit in satisfactory condition.    Pryor Curia MD

## 2020-09-18 NOTE — ED Notes (Signed)
Date and time results received: 09/18/20 12:57 AM  Test: Lactic Acid Critical Value: 3.1  Name of Provider Notified: Roxanne Mins, EDP

## 2020-09-19 ENCOUNTER — Encounter (HOSPITAL_COMMUNITY): Payer: Self-pay | Admitting: Urology

## 2020-09-19 DIAGNOSIS — N12 Tubulo-interstitial nephritis, not specified as acute or chronic: Secondary | ICD-10-CM

## 2020-09-19 LAB — GLUCOSE, CAPILLARY
Glucose-Capillary: 143 mg/dL — ABNORMAL HIGH (ref 70–99)
Glucose-Capillary: 134 mg/dL — ABNORMAL HIGH (ref 70–99)
Glucose-Capillary: 147 mg/dL — ABNORMAL HIGH (ref 70–99)
Glucose-Capillary: 206 mg/dL — ABNORMAL HIGH (ref 70–99)
Glucose-Capillary: 161 mg/dL — ABNORMAL HIGH (ref 70–99)
Glucose-Capillary: 125 mg/dL — ABNORMAL HIGH (ref 70–99)

## 2020-09-19 LAB — COMPREHENSIVE METABOLIC PANEL
ALT: 18 U/L (ref 0–44)
AST: 12 U/L — ABNORMAL LOW (ref 15–41)
Albumin: 2 g/dL — ABNORMAL LOW (ref 3.5–5.0)
Alkaline Phosphatase: 93 U/L (ref 38–126)
Anion gap: 10 (ref 5–15)
BUN: 151 mg/dL — ABNORMAL HIGH (ref 8–23)
CO2: 23 mmol/L (ref 22–32)
Calcium: 8.3 mg/dL — ABNORMAL LOW (ref 8.9–10.3)
Chloride: 119 mmol/L — ABNORMAL HIGH (ref 98–111)
Creatinine, Ser: 2.47 mg/dL — ABNORMAL HIGH (ref 0.61–1.24)
GFR, Estimated: 27 mL/min — ABNORMAL LOW (ref >60)
Glucose, Bld: 186 mg/dL — ABNORMAL HIGH (ref 70–99)
Potassium: 3.9 mmol/L (ref 3.5–5.1)
Sodium: 152 mmol/L — ABNORMAL HIGH (ref 135–145)
Total Bilirubin: 0.5 mg/dL (ref 0.3–1.2)
Total Protein: 5.3 g/dL — ABNORMAL LOW (ref 6.5–8.1)

## 2020-09-19 LAB — CBC WITH DIFFERENTIAL/PLATELET
Abs Immature Granulocytes: 0.22 10*3/uL — ABNORMAL HIGH (ref 0.00–0.07)
Basophils Absolute: 0.1 10*3/uL (ref 0.0–0.1)
Basophils Relative: 0 %
Eosinophils Absolute: 0.1 10*3/uL (ref 0.0–0.5)
Eosinophils Relative: 0 %
HCT: 39.2 % (ref 39.0–52.0)
Hemoglobin: 12.6 g/dL — ABNORMAL LOW (ref 13.0–17.0)
Immature Granulocytes: 1 %
Lymphocytes Relative: 5 %
Lymphs Abs: 1.1 10*3/uL (ref 0.7–4.0)
MCH: 26.9 pg (ref 26.0–34.0)
MCHC: 32.1 g/dL (ref 30.0–36.0)
MCV: 83.6 fL (ref 80.0–100.0)
Monocytes Absolute: 0.8 10*3/uL (ref 0.1–1.0)
Monocytes Relative: 4 %
Neutro Abs: 19.6 10*3/uL — ABNORMAL HIGH (ref 1.7–7.7)
Neutrophils Relative %: 90 %
Platelets: 34 10*3/uL — ABNORMAL LOW (ref 150–400)
RBC: 4.69 MIL/uL (ref 4.22–5.81)
RDW: 14.9 % (ref 11.5–15.5)
WBC: 21.9 10*3/uL — ABNORMAL HIGH (ref 4.0–10.5)
nRBC: 0 % (ref 0.0–0.2)

## 2020-09-19 NOTE — Progress Notes (Signed)
  Amiodarone Drug - Drug Interaction Consult Note  Recommendations: none  Amiodarone is metabolized by the cytochrome P450 system and therefore has the potential to cause many drug interactions. Amiodarone has an average plasma half-life of 50 days (range 20 to 100 days).   There is potential for drug interactions to occur several weeks or months after stopping treatment and the onset of drug interactions may be slow after initiating amiodarone.   []  Statins: Increased risk of myopathy. Simvastatin- restrict dose to 20mg  daily. Other statins: counsel patients to report any muscle pain or weakness immediately.  []  Anticoagulants: Amiodarone can increase anticoagulant effect. Consider warfarin dose reduction. Patients should be monitored closely and the dose of anticoagulant altered accordingly, remembering that amiodarone levels take several weeks to stabilize.  []  Antiepileptics: Amiodarone can increase plasma concentration of phenytoin, the dose should be reduced. Note that small changes in phenytoin dose can result in large changes in levels. Monitor patient and counsel on signs of toxicity.  []  Beta blockers: increased risk of bradycardia, AV block and myocardial depression. Sotalol - avoid concomitant use.  []   Calcium channel blockers (diltiazem and verapamil): increased risk of bradycardia, AV block and myocardial depression.  []   Cyclosporine: Amiodarone increases levels of cyclosporine. Reduced dose of cyclosporine is recommended.  []  Digoxin dose should be halved when amiodarone is started.  []  Diuretics: increased risk of cardiotoxicity if hypokalemia occurs.  []  Oral hypoglycemic agents (glyburide, glipizide, glimepiride): increased risk of hypoglycemia. Patient's glucose levels should be monitored closely when initiating amiodarone therapy.   []  Drugs that prolong the QT interval:  Torsades de pointes risk may be increased with concurrent use - avoid if possible.  Monitor QTc,  also keep magnesium/potassium WNL if concurrent therapy can't be avoided. Marland Kitchen Antibiotics: e.g. fluoroquinolones, erythromycin. . Antiarrhythmics: e.g. quinidine, procainamide, disopyramide, sotalol. . Antipsychotics: e.g. phenothiazines, haloperidol.  . Lithium, tricyclic antidepressants, and methadone. Thank You,   Eudelia Bunch, Pharm.D 09/19/2020 8:29 AM

## 2020-09-19 NOTE — Progress Notes (Signed)
Chaplain engaged in initial visit with Edward Wells.  Edward Wells shared that he is just now coming to a place of coherence after having trouble with his kidneys.  He expressed being "out of it" and not knowing what was happening until he finally came to and his doctor verbalized what medical interventions they engaged in for his wellness.  He expressed having no recollection of his time before in the hospital.    Edward Wells also shared that his closest family and support lives in [place], although he has family all over the [place].  Chaplain was able to meet his niece today, who he shared is the one he trusts the most in his life.  Edward Wells voiced wanting to get things in order while he is able and allow his niece to be his healthcare POA.  Chaplain provided education around Advanced Directive and left paperwork with them to fill out.   Chaplain will follow-up when paged to complete Advanced Directive. Chaplain offered support and ministries of listening during visit and affirmed the support he has from his niece who he plans to move to [place] to live with.     09/19/20 1100  Clinical Encounter Type  Visited With Patient;Patient and family together  Visit Type Initial;Social support

## 2020-09-19 NOTE — Plan of Care (Signed)
  Problem: Clinical Measurements: Goal: Ability to maintain clinical measurements within normal limits will improve 09/19/2020 0746 by Leeanne Deed, RN Outcome: Progressing 09/18/2020 1752 by Leeanne Deed, RN Outcome: Progressing Goal: Will remain free from infection 09/19/2020 0746 by Leeanne Deed, RN Outcome: Progressing 09/18/2020 1752 by Leeanne Deed, RN Outcome: Progressing Goal: Diagnostic test results will improve 09/19/2020 0746 by Leeanne Deed, RN Outcome: Progressing 09/18/2020 1752 by Leeanne Deed, RN Outcome: Progressing Goal: Respiratory complications will improve 09/19/2020 0746 by Leeanne Deed, RN Outcome: Progressing 09/18/2020 1752 by Leeanne Deed, RN Outcome: Progressing Goal: Cardiovascular complication will be avoided Outcome: Progressing   Problem: Pain Managment: Goal: General experience of comfort will improve Outcome: Progressing   Problem: Safety: Goal: Ability to remain free from injury will improve 09/19/2020 0746 by Leeanne Deed, RN Outcome: Progressing 09/18/2020 1752 by Leeanne Deed, RN Outcome: Progressing

## 2020-09-19 NOTE — TOC Initial Note (Signed)
Transition of Care [facility]) - Initial/Assessment Note    Patient Details  Name: Edward Wells MRN: [id number] Date of Birth: Feb 20, 1946  Transition of Care [facility]) CM/SW Contact:    Leeroy Cha, RN Phone Number: 09/19/2020, 10:05 AM  Clinical Narrative:                 74 y/o male brought to the ED on 12/14 after she was not answering her neighbor's calls, noted to be in septic shock due to pyelonephritis. He underwent cystoscopy with right ureteral stent placement. Returned to ICU in shock, AFwRVR, AKI due to obstructing stones.  Cultures positive for E-Coli bacteremia.    PLAN: to return home  Has a pcp Folloowing for progression, iv rocephin, Iv lr at 100cc/hr/ Bun-151 creat 2.47 Wbc 21.9 Past Medical History  Hypertension Acute gastric ulcer with hemorrhage  Significant Hospital Events   12/14 Admit with pyelonephritis and septic shock 12/15 Off levophed 12/16 Cultures positive for e-coli, hemodynamically stable. Amiodarone stopped, in SR.    Expected Discharge Plan: Home/Self Care Barriers to Discharge: No Barriers Identified   Patient Goals and CMS Choice Patient states their goals for this hospitalization and ongoing recovery are:: to go back home CMS Medicare.gov Compare Post Acute Care list provided to:: Patient    Expected Discharge Plan and Services Expected Discharge Plan: Home/Self Care   Discharge Planning Services: CM Consult   Living arrangements for the past 2 months: Single Family Home                                      Prior Living Arrangements/Services Living arrangements for the past 2 months: Single Family Home Lives with:: Self Patient language and need for interpreter reviewed:: Yes Do you feel safe going back to the place where you live?: Yes      Need for Family Participation in Patient Care: Yes (Comment) Care giver support system in place?: Yes (comment)   Criminal Activity/Legal Involvement Pertinent to Current  Situation/Hospitalization: No - Comment as needed  Activities of Daily Living Home Assistive Devices/Equipment: Eyeglasses ADL Screening (condition at time of admission) Patient's cognitive ability adequate to safely complete daily activities?: Yes Is the patient deaf or have difficulty hearing?: No Does the patient have difficulty seeing, even when wearing glasses/contacts?: No Does the patient have difficulty concentrating, remembering, or making decisions?: Yes Patient able to express need for assistance with ADLs?: Yes Does the patient have difficulty dressing or bathing?: Yes Independently performs ADLs?: No Communication: Independent Dressing (OT): Needs assistance Is this a change from baseline?: Change from baseline, expected to last >3 days Grooming: Needs assistance Is this a change from baseline?: Change from baseline, expected to last >3 days Feeding: Needs assistance Is this a change from baseline?: Change from baseline, expected to last >3 days Bathing: Needs assistance Is this a change from baseline?: Change from baseline, expected to last >3 days Toileting: Needs assistance Is this a change from baseline?: Change from baseline, expected to last >3days In/Out Bed: Needs assistance Is this a change from baseline?: Change from baseline, expected to last >3 days Walks in Home: Needs assistance Is this a change from baseline?: Change from baseline, expected to last >3 days Does the patient have difficulty walking or climbing stairs?: Yes (secondary to weakness) Weakness of Legs: Both Weakness of Arms/Hands: Both  Permission Sought/Granted  Emotional Assessment Appearance:: Appears stated age Attitude/Demeanor/Rapport: Engaged Affect (typically observed): Calm Orientation: : Oriented to  Time,Oriented to Place,Oriented to Self,Oriented to Situation Alcohol / Substance Use: Not Applicable Psych Involvement: No (comment)  Admission diagnosis:   Dehydration [E86.0] Lactic acidosis [E87.2] Pyelonephritis [N12] Acute unilateral obstructive uropathy [N13.9] SIRS (systemic inflammatory response syndrome) ([place]) [R65.10] Acute renal failure, unspecified acute renal failure type [facility]) [N17.9] Patient Active Problem List   Diagnosis Date Noted  . Pyelonephritis 09/18/2020  . Sepsis secondary to UTI ([place]) 02/16/2019  . Acute renal failure (ARF) ([place]) 02/16/2019  . Hyperglycemia 02/16/2019  . Acute gastric ulcer with hemorrhage 07/30/2014  . Essential hypertension 07/30/2014  . Normocytic anemia due to blood loss 07/30/2014   PCP:  Elizabeth Palau, MD (Inactive) Pharmacy:   [id number] - [place], [address] [place] [address] Phone: [phone] Fax: [phone]  [place], [address], [address] [place], [address] [place] [address] Phone: [phone] Fax: [phone]  [facility] - [place], [place] [address] [place] [address] Phone: [phone] Fax: [phone]     Social Determinants of Health (SDOH) Interventions    Readmission Risk Interventions No flowsheet data found.

## 2020-09-19 NOTE — Progress Notes (Signed)
Patient ID: Edward Wells, male   DOB: 1946/08/17, 74 y.o.   MRN: [id number]  1 Day Post-Op Subjective: Pt feeling much improved. Off pressors.  Objective: Vital signs in last 24 hours: Temp:  [96.8 F (36 C)-97.4 F (36.3 C)] 97.4 F (36.3 C) (12/16 1106) Pulse Rate:  [36-111] 102 (12/16 1600) Resp:  [11-18] 15 (12/16 1600) BP: (100-148)/(48-87) 116/79 (12/16 1600) SpO2:  [91 %-100 %] 98 % (12/16 1600)  Intake/Output from previous day: 12/15 0701 - 12/16 0700 In: 2990.7 [I.V.:2890.7; IV Piggyback:100] Out: 800 [Urine:800] Intake/Output this shift: Total I/O In: 1317.2 [P.O.:600; I.V.:717.2] Out: 800 [Urine:800]  Physical Exam:  General: Alert and oriented  Lab Results: Recent Labs    09/17/20 2314 09/19/20 0239  HGB 13.8 12.6*  HCT 41.6 39.2   CBC Latest Ref Rng & Units 09/19/2020 09/18/2020 09/17/2020  WBC 4.0 - 10.5 K/uL 21.9(H) - 47.3(H)  Hemoglobin 13.0 - 17.0 g/dL 12.6(L) - 13.8  Hematocrit 39.0 - 52.0 % 39.2 - 41.6  Platelets 150 - 400 K/uL 34(L) 39(L) 50(L)     BMET Recent Labs    09/18/20 0929 09/19/20 0239  NA 150* 152*  K 3.8 3.9  CL 117* 119*  CO2 20* 23  GLUCOSE 221* 186*  BUN 193* 151*  CREATININE 3.03* 2.47*  CALCIUM 7.8* 8.3*     Studies/Results: Urine and blood cultures growing E coli  Assessment/Plan: 1: Right ureteral calculi with sepsis: Resolving.  On broad spectrum antibiotics pending final sensitivities.  Will need 10-14 days of culture specific antibiotics and will arrange outpatient follow up after treatment to discuss definitive treatment for ureteral stones.  Renal function improving.   LOS: 1 day   Dutch Gray 09/19/2020, 5:26 PM

## 2020-09-19 NOTE — Progress Notes (Signed)
NAME:  Edward Wells, MRN:  [id number], DOB:  Feb 03, 1946, LOS: 1 ADMISSION DATE:  09/17/2020, CONSULTATION DATE:  12/15 REFERRING MD:  Roxanne Mins, CHIEF COMPLAINT:  Found down   Brief History   74 y/o male brought to the ED on 12/14 after she was not answering her neighbor's calls, noted to be in septic shock due to pyelonephritis. He underwent cystoscopy with right ureteral stent placement. Returned to ICU in shock, AFwRVR, AKI due to obstructing stones.  Cultures positive for E-Coli bacteremia.     Past Medical History  Hypertension Acute gastric ulcer with hemorrhage  Significant Hospital Events   12/14 Admit with pyelonephritis and septic shock 12/15 Off levophed 12/16 Cultures positive for e-coli, hemodynamically stable. Amiodarone stopped, in SR.   Consults:  Urology  Procedures:    Significant Diagnostic Tests:  CT abdomen 12/15 >> 3 obstructing right proximal ureteral calculi causing moderate right hydronephrosis, pancolonic moderate diverticulosis  Micro Data:  COVID 12/14 >> negative  Influenza A/B 12/14 >> negative  BCID 12/14 >> enterobacterales, e-coli detected  UC 12/15 >>  BCx2 12/14 >> GNR >>   Antimicrobials:  Cefepime 12/14 >> 12/15 Ceftriaxone 12/15 >>   Interim history/subjective:  Pt reports feeling better this am, states he doesn't remember much about the admission  Afebrile / WBC down to 21.9 (from 47.3) RA Glucose range 134-186  I/O 800 ml UOP, +2.1L in last 24 hours  Remains on amiodarone, in SR   Objective   Blood pressure 113/64, pulse 79, temperature (!) 96.9 F (36.1 C), temperature source Axillary, resp. rate 11, height 6' (1.829 m), weight 94.8 kg, SpO2 98 %.        Intake/Output Summary (Last 24 hours) at 09/19/2020 0826 Last data filed at 09/19/2020 0700 Gross per 24 hour  Intake 2990.74 ml  Output 800 ml  Net 2190.74 ml   Filed Weights   09/18/20 1103 09/18/20 1400  Weight: 103.9 kg 94.8 kg    Examination: General: adult male  lying in bed in NAD, eating graham crackers HEENT: MM pink/dry, no jvd, anicteric, pupils reactive  Neuro: AAOx4, speech clear, MAE  CV: s1s2 RRR, SR on monitor, no m/r/g PULM: non-labored on RA, lungs bilaterally clear GI: soft, bsx4 active  Extremities: warm/dry, no edema  Skin: no rashes or lesions  Resolved Hospital Problem list     Assessment & Plan:   Septic shock due to E-Coli Bacteremia in setting of Pyelonephritis Volume resuscitated, required levophed on admit, pressors off 12/15. Lactic acid cleared. S/p cystoscopy with right ureteral stent placement per Urology 12/15. -change to SDU status  -appreciate Urology assistance with patient care -continue ceftriaxone -follow cultures to maturity   Obstructive hydronephrosis from nephrolithiasis AKI  Admit BUN 211 -LR at 159ml/hr, consider reduction 12/17 am pending exam / renal function / PO intake  -encouraged PO fluid intake  -Trend BMP / urinary output -Replace electrolytes as indicated -Avoid nephrotoxic agents, ensure adequate renal perfusion  Thrombocytopenia  Normal fibrinogen but may be acute phase reactant (consumption despite normal fibrinogen), no schistocytes.  Suspect changes in setting of severe sepsis rather than DIC.  -follow CBC closely -monitor for bleeding, none observed  -transfuse for Hgb <7% or active bleeding  AFwRVR  No hx of per patient, now in [place].  Suspect related to severe sepsis  -stop amiodarone gtt  -continue tele monitoring   Hyperglycemia: New diagnosis of diabetes (history supports) Hgb A1c 6.6 on admit  -SSI, moderate scale  -will need addition of oral  agent once renal indices have recovered   Best practice (evaluated daily)  Diet: Diabetic diet  Pain/Anxiety/Delirium protocol (if indicated): n/a VAP protocol (if indicated): n/a DVT prophylaxis: SCD GI prophylaxis: n/a Glucose control: as above Mobility: up ad lib Last date of multidisciplinary goals of care discussion:  pending  Family and staff present: Summary of discussion: Follow up goals of care discussion due 12/21 Code Status: full code  Disposition: SDU, to [facility] as of 12/17 am.   Labs   CBC: Recent Labs  Lab 09/17/20 2314 09/18/20 0929 09/19/20 0239  WBC 47.3*  --  21.9*  NEUTROABS 43.9*  --  19.6*  HGB 13.8  --  12.6*  HCT 41.6  --  39.2  MCV 81.3  --  83.6  PLT 50* 39* 34*    Basic Metabolic Panel: Recent Labs  Lab 09/17/20 2314 09/18/20 0929 09/19/20 0239  NA 146* 150* 152*  K 3.9 3.8 3.9  CL 114* 117* 119*  CO2 18* 20* 23  GLUCOSE 308* 221* 186*  BUN 211* 193* 151*  CREATININE 3.61* 3.03* 2.47*  CALCIUM 7.7* 7.8* 8.3*  MG 3.3*  --   --    GFR: Estimated Creatinine Clearance: 32.3 mL/min (A) (by C-G formula based on SCr of 2.47 mg/dL (H)). Recent Labs  Lab 09/17/20 0320 09/17/20 2314 09/18/20 0929 09/19/20 0239  WBC  --  47.3*  --  21.9*  LATICACIDVEN 2.0* 3.1* 1.8  --     Liver Function Tests: Recent Labs  Lab 09/17/20 2314 09/18/20 0929 09/19/20 0239  AST 13* 11* 12*  ALT 20 18 18   ALKPHOS 101 344* 93  BILITOT 0.8 1.0 0.5  PROT 6.0* 4.9* 5.3*  ALBUMIN 2.4* 1.9* 2.0*   Recent Labs  Lab 09/17/20 2314  LIPASE 35   No results for input(s): AMMONIA in the last 168 hours.  ABG No results found for: PHART, PCO2ART, PO2ART, HCO3, TCO2, ACIDBASEDEF, O2SAT   Coagulation Profile: Recent Labs  Lab 09/18/20 0929  INR 1.5*    Cardiac Enzymes: No results for input(s): CKTOTAL, CKMB, CKMBINDEX, TROPONINI in the last 168 hours.  HbA1C: Hgb A1c MFr Bld  Date/Time Value Ref Range Status  09/18/2020 09:29 AM 6.6 (H) 4.8 - 5.6 % Final    Comment:    (NOTE) Pre diabetes:          5.7%-6.4%  Diabetes:              >6.4%  Glycemic control for   <7.0% adults with diabetes   02/16/2019 04:57 PM 5.7 (H) 4.8 - 5.6 % Final    Comment:    (NOTE) Pre diabetes:          5.7%-6.4% Diabetes:              >6.4% Glycemic control for   <7.0% adults with  diabetes     CBG: Recent Labs  Lab 09/18/20 1710 09/18/20 1922 09/18/20 2345 09/19/20 0414 09/19/20 0716  GLUCAP 227* 211* 194* 143* 134*     Critical care time:     Noe Gens, MSN, NP-C, AGACNP-BC [facility] 09/19/2020, 8:26 AM   Please see [email or web address] for pager details.

## 2020-09-19 NOTE — Progress Notes (Signed)
Inpatient Diabetes Program Recommendations  AACE/ADA: New Consensus Statement on Inpatient Glycemic Control (2015)  Target Ranges:  Prepandial:   less than 140 mg/dL      Peak postprandial:   less than 180 mg/dL (1-2 hours)      Critically ill patients:  140 - 180 mg/dL   Lab Results  Component Value Date   GLUCAP 147 (H) 09/19/2020   HGBA1C 6.6 (H) 09/18/2020    Review of Glycemic Control Results for Edward Wells, Edward Wells (MRN [id number]) as of 09/19/2020 15:15  Ref. Range 09/19/2020 04:14 09/19/2020 07:16 09/19/2020 11:34  Glucose-Capillary Latest Ref Range: 70 - 99 mg/dL 143 (H) 134 (H) 147 (H)   Diabetes history: New osnet Outpatient Diabetes medicaions: none Current orders for Inpatient glycemic control: Novolog 0-15 units Q4H  Inpatient Diabetes Program Recommendations:    Noted consult. Spoke with patient regarding new onset diabetes. Patient reports being told at a recent PCP visit that his blood sugar was elevated but it came down at the next visit.  Reviewed patient's current A1c of 6.6%. Explained what a A1c is and what it measures. Also reviewed goal A1c with patient, importance of good glucose control @ home, and blood sugar goals. Reviewed patho of DM, role of pancreas, oral agents, hypo vs hyper glycemia, survival skills, vascular changes, impact of infection, and commorbidities.  Patient admits to drinking sugary beverages at home but is motivated to make changes. Reviewed alternatives, importance of being mindful of CHO intake and plate method.  Encouraged patient to follow up with PCP regarding diabetes. Given current GFR would not recommend Metformin/Glipizide until renal status improves. Patient has no further questions at this time.   Thanks, Bronson Curb, MSN, RNC-OB Diabetes Coordinator [phone] (8a-5p)

## 2020-09-19 NOTE — Anesthesia Postprocedure Evaluation (Signed)
Anesthesia Post Note  Patient: Ryin Ambrosius  Procedure(s) Performed: CYSTOSCOPY WITH RETROGRADE PYELOGRAM/URETERAL STENT PLACEMENT (Right )     Patient location during evaluation: PACU Anesthesia Type: General Level of consciousness: awake and alert Pain management: pain level controlled Vital Signs Assessment: post-procedure vital signs reviewed and stable Respiratory status: spontaneous breathing, nonlabored ventilation, respiratory function stable and patient connected to nasal cannula oxygen Cardiovascular status: blood pressure returned to baseline and stable Postop Assessment: no apparent nausea or vomiting Anesthetic complications: no   No complications documented.  Last Vitals:  Vitals:   09/19/20 0400 09/19/20 0500  BP: (!) 110/59 (!) 122/55  Pulse: 72 67  Resp: 15 13  Temp: (!) 36.1 C   SpO2: 99% 99%    Last Pain:  Vitals:   09/19/20 0400  TempSrc: Axillary  PainSc: 0-No pain                 Westyn Driggers DAVID

## 2020-09-19 NOTE — Progress Notes (Signed)
Assisted pt in completing advance directives

## 2020-09-20 DIAGNOSIS — E872 Acidosis: Secondary | ICD-10-CM

## 2020-09-20 LAB — GLUCOSE, CAPILLARY
Glucose-Capillary: 112 mg/dL — ABNORMAL HIGH (ref 70–99)
Glucose-Capillary: 156 mg/dL — ABNORMAL HIGH (ref 70–99)
Glucose-Capillary: 168 mg/dL — ABNORMAL HIGH (ref 70–99)
Glucose-Capillary: 178 mg/dL — ABNORMAL HIGH (ref 70–99)
Glucose-Capillary: 186 mg/dL — ABNORMAL HIGH (ref 70–99)
Glucose-Capillary: 190 mg/dL — ABNORMAL HIGH (ref 70–99)

## 2020-09-20 LAB — BASIC METABOLIC PANEL
Anion gap: 10 (ref 5–15)
BUN: 102 mg/dL — ABNORMAL HIGH (ref 8–23)
CO2: 24 mmol/L (ref 22–32)
Calcium: 8.9 mg/dL (ref 8.9–10.3)
Chloride: 124 mmol/L — ABNORMAL HIGH (ref 98–111)
Creatinine, Ser: 1.89 mg/dL — ABNORMAL HIGH (ref 0.61–1.24)
GFR, Estimated: 37 mL/min — ABNORMAL LOW (ref >60)
Glucose, Bld: 133 mg/dL — ABNORMAL HIGH (ref 70–99)
Potassium: 4.1 mmol/L (ref 3.5–5.1)
Sodium: 158 mmol/L — ABNORMAL HIGH (ref 135–145)

## 2020-09-20 LAB — CULTURE, BLOOD (ROUTINE X 2)
Special Requests: ADEQUATE
Special Requests: ADEQUATE

## 2020-09-20 LAB — CBC WITH DIFFERENTIAL/PLATELET
Abs Immature Granulocytes: 0.08 10*3/uL — ABNORMAL HIGH (ref 0.00–0.07)
Basophils Absolute: 0 10*3/uL (ref 0.0–0.1)
Basophils Relative: 0 %
Eosinophils Absolute: 0 10*3/uL (ref 0.0–0.5)
Eosinophils Relative: 0 %
HCT: 39.4 % (ref 39.0–52.0)
Hemoglobin: 12.4 g/dL — ABNORMAL LOW (ref 13.0–17.0)
Immature Granulocytes: 1 %
Lymphocytes Relative: 9 %
Lymphs Abs: 1.1 10*3/uL (ref 0.7–4.0)
MCH: 26.7 pg (ref 26.0–34.0)
MCHC: 31.5 g/dL (ref 30.0–36.0)
MCV: 84.7 fL (ref 80.0–100.0)
Monocytes Absolute: 0.8 10*3/uL (ref 0.1–1.0)
Monocytes Relative: 6 %
Neutro Abs: 10.9 10*3/uL — ABNORMAL HIGH (ref 1.7–7.7)
Neutrophils Relative %: 84 %
Platelets: 34 10*3/uL — ABNORMAL LOW (ref 150–400)
RBC: 4.65 MIL/uL (ref 4.22–5.81)
RDW: 14.9 % (ref 11.5–15.5)
WBC: 12.9 10*3/uL — ABNORMAL HIGH (ref 4.0–10.5)
nRBC: 0 % (ref 0.0–0.2)

## 2020-09-20 LAB — URINE CULTURE: Culture: >=100000 — AB

## 2020-09-20 MED ORDER — INSULIN ASPART 100 UNIT/ML ~~LOC~~ SOLN
0.0000 [IU] | Freq: Every day | SUBCUTANEOUS | Status: DC
  Administered 2020-09-25: 22:00:00 2 [IU] via SUBCUTANEOUS

## 2020-09-20 MED ORDER — DEXTROSE 5 % IV SOLN: INTRAVENOUS | Status: DC

## 2020-09-20 MED ORDER — INSULIN ASPART 100 UNIT/ML ~~LOC~~ SOLN
0.0000 [IU] | Freq: Three times a day (TID) | SUBCUTANEOUS | Status: DC
  Administered 2020-09-20 – 2020-09-22 (×6): 3 [IU] via SUBCUTANEOUS
  Administered 2020-09-22: 12:00:00 2 [IU] via SUBCUTANEOUS
  Administered 2020-09-23: 13:00:00 3 [IU] via SUBCUTANEOUS
  Administered 2020-09-23: 17:00:00 2 [IU] via SUBCUTANEOUS
  Administered 2020-09-23: 09:00:00 3 [IU] via SUBCUTANEOUS
  Administered 2020-09-24 (×2): 2 [IU] via SUBCUTANEOUS
  Administered 2020-09-24 – 2020-09-25 (×2): 3 [IU] via SUBCUTANEOUS
  Administered 2020-09-25: 08:00:00 2 [IU] via SUBCUTANEOUS
  Administered 2020-09-26: 11:00:00 3 [IU] via SUBCUTANEOUS
  Administered 2020-09-26: 17:00:00 5 [IU] via SUBCUTANEOUS
  Administered 2020-09-27: 17:00:00 3 [IU] via SUBCUTANEOUS
  Administered 2020-09-27: 13:00:00 5 [IU] via SUBCUTANEOUS
  Administered 2020-09-27 – 2020-09-28 (×2): 3 [IU] via SUBCUTANEOUS
  Administered 2020-09-28 – 2020-10-01 (×5): 2 [IU] via SUBCUTANEOUS
  Administered 2020-10-01: 13:00:00 3 [IU] via SUBCUTANEOUS
  Administered 2020-10-02 (×2): 2 [IU] via SUBCUTANEOUS

## 2020-09-20 NOTE — Progress Notes (Signed)
PROGRESS NOTE    Edward Wells  SHF:[id number] DOB: 1946-03-08 DOA: 09/17/2020 PCP: Elizabeth Palau, MD (Inactive)    Brief Narrative:  74yo who presented with septic shock from pyelonephritis, later undergoing cystoscopy and resultant R ureteral stent placement  Assessment & Plan:   Active Problems:   Pyelonephritis  1. Septic shock from ecoli bacteremia and pyelonephritis present on admit 1. Blood and urine cx pos for ecoli 2. Currently on rocephin 3. Urology following and is now s/p stent placement 4. Per Urology, recommendation for 10-14 days of abx 5. Repeat CMP and CBC 2. Thrombocytopenia 1. Stable 2. Likely secondary to presenting septic shock 3. Hypernatremia 1. Continued on IVF 2. Will change to D5w 3. Check cmp in AM 4. Afib with RVR 1. Transient and resolved 2. Likely secondary to sepsis 5. New diagnosis of DM 1. Continue on SSI coverage  DVT prophylaxis: SCD's Code Status: Full Family Communication: Pt in room, family at bedside  Status is: Inpatient  Remains inpatient appropriate because:IV treatments appropriate due to intensity of illness or inability to take PO and Inpatient level of care appropriate due to severity of illness   Dispo: The patient is from: Home              Anticipated d/c is to: Home              Anticipated d/c date is: 2 days              Patient currently is not medically stable to d/c.       Consultants:   Urology  PCCM  Procedures:   Cystoscopy with stent placement  Antimicrobials: Anti-infectives (From admission, onward)   Start     Dose/Rate Route Frequency Ordered Stop   09/18/20 2200  ceFEPIme (MAXIPIME) 2 g in sodium chloride 0.9 % 100 mL IVPB  Status:  Discontinued        2 g 200 mL/hr over 30 Minutes Intravenous Every 24 hours 09/18/20 1108 09/18/20 1827   09/18/20 2200  cefTRIAXone (ROCEPHIN) 2 g in sodium chloride 0.9 % 100 mL IVPB        2 g 200 mL/hr over 30 Minutes Intravenous Every 24 hours  09/18/20 1827     09/18/20 0000  vancomycin (VANCOREADY) IVPB 2000 mg/400 mL        2,000 mg 200 mL/hr over 120 Minutes Intravenous  Once 09/17/20 2356 09/18/20 0216   09/18/20 0000  ceFEPIme (MAXIPIME) 2 g in sodium chloride 0.9 % 100 mL IVPB        2 g 200 mL/hr over 30 Minutes Intravenous  Once 09/17/20 2356 09/18/20 0107       Subjective: Reports feeling better  Objective: Vitals:   09/20/20 1202 09/20/20 1205 09/20/20 1251 09/20/20 1310  BP: 126/66   139/79  Pulse:  95  99  Resp:  13  12  Temp:   98 F (36.7 C) 98.1 F (36.7 C)  TempSrc:   Oral Oral  SpO2:  98%  100%  Weight:      Height:        Intake/Output Summary (Last 24 hours) at 09/20/2020 1751 Last data filed at 09/20/2020 1503 Gross per 24 hour  Intake 1989.55 ml  Output 1750 ml  Net 239.55 ml   Filed Weights   09/18/20 1103 09/18/20 1400  Weight: 103.9 kg 94.8 kg    Examination: General exam: Appears calm and comfortable  Respiratory system: Clear to auscultation. Respiratory effort normal. Cardiovascular  system: S1 & S2 heard, Regular Gastrointestinal system: Abdomen is nondistended, soft and nontender. No organomegaly or masses felt. Normal bowel sounds heard. Central nervous system: Alert and oriented. No focal neurological deficits. Extremities: Symmetric 5 x 5 power. Skin: No rashes, lesions Psychiatry: Judgement and insight appear normal. Mood & affect appropriate.   Data Reviewed: I have personally reviewed following labs and imaging studies  CBC: Recent Labs  Lab 09/17/20 2314 09/18/20 0929 09/19/20 0239 09/20/20 0235  WBC 47.3*  --  21.9* 12.9*  NEUTROABS 43.9*  --  19.6* 10.9*  HGB 13.8  --  12.6* 12.4*  HCT 41.6  --  39.2 39.4  MCV 81.3  --  83.6 84.7  PLT 50* 39* 34* 34*   Basic Metabolic Panel: Recent Labs  Lab 09/17/20 2314 09/18/20 0929 09/19/20 0239 09/20/20 0235  NA 146* 150* 152* 158*  K 3.9 3.8 3.9 4.1  CL 114* 117* 119* 124*  CO2 18* 20* 23 24  GLUCOSE  308* 221* 186* 133*  BUN 211* 193* 151* 102*  CREATININE 3.61* 3.03* 2.47* 1.89*  CALCIUM 7.7* 7.8* 8.3* 8.9  MG 3.3*  --   --   --    GFR: Estimated Creatinine Clearance: 42.2 mL/min (A) (by C-G formula based on SCr of 1.89 mg/dL (H)). Liver Function Tests: Recent Labs  Lab 09/17/20 2314 09/18/20 0929 09/19/20 0239  AST 13* 11* 12*  ALT 20 18 18   ALKPHOS 101 344* 93  BILITOT 0.8 1.0 0.5  PROT 6.0* 4.9* 5.3*  ALBUMIN 2.4* 1.9* 2.0*   Recent Labs  Lab 09/17/20 2314  LIPASE 35   No results for input(s): AMMONIA in the last 168 hours. Coagulation Profile: Recent Labs  Lab 09/18/20 0929  INR 1.5*   Cardiac Enzymes: No results for input(s): CKTOTAL, CKMB, CKMBINDEX, TROPONINI in the last 168 hours. BNP (last 3 results) No results for input(s): PROBNP in the last 8760 hours. HbA1C: Recent Labs    09/18/20 0929  HGBA1C 6.6*   CBG: Recent Labs  Lab 09/19/20 2329 09/20/20 0413 09/20/20 0737 09/20/20 1153 09/20/20 1713  GLUCAP 125* 112* 156* 178* 168*   Lipid Profile: No results for input(s): CHOL, HDL, LDLCALC, TRIG, CHOLHDL, LDLDIRECT in the last 72 hours. Thyroid Function Tests: Recent Labs    09/18/20 0537  TSH 1.542   Anemia Panel: No results for input(s): VITAMINB12, FOLATE, FERRITIN, TIBC, IRON, RETICCTPCT in the last 72 hours. Sepsis Labs: Recent Labs  Lab 09/17/20 0320 09/17/20 2314 09/18/20 0929  LATICACIDVEN 2.0* 3.1* 1.8    Recent Results (from the past 240 hour(s))  Blood Culture (routine x 2)     Status: Abnormal   Collection Time: 09/17/20 11:14 PM   Specimen: BLOOD  Result Value Ref Range Status   Specimen Description   Final    BLOOD RIGHT ANTECUBITAL Performed at [facility] [address] [place], [place] [address]    Special Requests   Final    BOTTLES DRAWN AEROBIC AND ANAEROBIC Blood Culture adequate volume Performed at [facility] [address], [place], [place] [address]     Culture  Setup Time   Final    GRAM NEGATIVE RODS IN BOTH AEROBIC AND ANAEROBIC BOTTLES Organism ID to follow CRITICAL RESULT CALLED TO, READ BACK BY AND VERIFIED WITHKeturah Barre [facility] PHARMD 1619 09/17/20 A BROWNING Performed at [facility], [place] [address], [place], [place] [address]    Culture ESCHERICHIA COLI (A)  Final  Report Status 09/20/2020 FINAL  Final   Organism ID, Bacteria ESCHERICHIA COLI  Final      Susceptibility   Escherichia coli - MIC*    AMPICILLIN 4 SENSITIVE Sensitive     CEFAZOLIN <=4 SENSITIVE Sensitive     CEFEPIME <=0.12 SENSITIVE Sensitive     CEFTAZIDIME <=1 SENSITIVE Sensitive     CEFTRIAXONE <=0.25 SENSITIVE Sensitive     CIPROFLOXACIN <=0.25 SENSITIVE Sensitive     GENTAMICIN <=1 SENSITIVE Sensitive     IMIPENEM <=0.25 SENSITIVE Sensitive     TRIMETH/SULFA <=20 SENSITIVE Sensitive     AMPICILLIN/SULBACTAM <=2 SENSITIVE Sensitive     PIP/TAZO <=4 SENSITIVE Sensitive     * ESCHERICHIA COLI  Blood Culture (routine x 2)     Status: Abnormal   Collection Time: 09/17/20 11:14 PM   Specimen: BLOOD  Result Value Ref Range Status   Specimen Description   Final    BLOOD LEFT ANTECUBITAL Performed at [facility] [address], [place], [place] [address]    Special Requests   Final    BOTTLES DRAWN AEROBIC AND ANAEROBIC Blood Culture adequate volume Performed at [facility] [address], [place], [place] [address]    Culture  Setup Time   Final    GRAM NEGATIVE RODS IN BOTH AEROBIC AND ANAEROBIC BOTTLES CRITICAL VALUE NOTED.  VALUE IS CONSISTENT WITH PREVIOUSLY REPORTED AND CALLED VALUE.    Culture (A)  Final    ESCHERICHIA COLI SUSCEPTIBILITIES PERFORMED ON PREVIOUS CULTURE WITHIN THE LAST 5 DAYS. Performed at [facility], [place] [address], [place], [place] [address]    Report Status 09/20/2020 FINAL  Final  Blood Culture ID Panel (Reflexed)     Status: Abnormal   Collection Time: 09/17/20 11:14 PM   Result Value Ref Range Status   Enterococcus faecalis NOT DETECTED NOT DETECTED Final   Enterococcus Faecium NOT DETECTED NOT DETECTED Final   Listeria monocytogenes NOT DETECTED NOT DETECTED Final   Staphylococcus species NOT DETECTED NOT DETECTED Final   Staphylococcus aureus (BCID) NOT DETECTED NOT DETECTED Final   Staphylococcus epidermidis NOT DETECTED NOT DETECTED Final   Staphylococcus lugdunensis NOT DETECTED NOT DETECTED Final   Streptococcus species NOT DETECTED NOT DETECTED Final   Streptococcus agalactiae NOT DETECTED NOT DETECTED Final   Streptococcus pneumoniae NOT DETECTED NOT DETECTED Final   Streptococcus pyogenes NOT DETECTED NOT DETECTED Final   A.calcoaceticus-baumannii NOT DETECTED NOT DETECTED Final   Bacteroides fragilis NOT DETECTED NOT DETECTED Final   Enterobacterales DETECTED (A) NOT DETECTED Final    Comment: Enterobacterales represent a large order of gram negative bacteria, not a single organism. CRITICAL RESULT CALLED TO, READ BACK BY AND VERIFIED WITH: D [facility] PHARMD 1619 09/17/20 A BROWNING    Enterobacter cloacae complex NOT DETECTED NOT DETECTED Final   Escherichia coli DETECTED (A) NOT DETECTED Final    Comment: CRITICAL RESULT CALLED TO, READ BACK BY AND VERIFIED WITH: D [facility] PHARMD 1619 09/17/20 A BROWNING    Klebsiella aerogenes NOT DETECTED NOT DETECTED Final   Klebsiella oxytoca NOT DETECTED NOT DETECTED Final   Klebsiella pneumoniae NOT DETECTED NOT DETECTED Final   Proteus species NOT DETECTED NOT DETECTED Final   Salmonella species NOT DETECTED NOT DETECTED Final   Serratia marcescens NOT DETECTED NOT DETECTED Final   Haemophilus influenzae NOT DETECTED NOT DETECTED Final   Neisseria meningitidis NOT DETECTED NOT DETECTED Final   Pseudomonas aeruginosa NOT DETECTED NOT DETECTED Final   Stenotrophomonas maltophilia  NOT DETECTED NOT DETECTED Final   Candida albicans NOT DETECTED NOT DETECTED Final   Candida auris NOT DETECTED NOT  DETECTED Final   Candida glabrata NOT DETECTED NOT DETECTED Final   Candida krusei NOT DETECTED NOT DETECTED Final   Candida parapsilosis NOT DETECTED NOT DETECTED Final   Candida tropicalis NOT DETECTED NOT DETECTED Final   Cryptococcus neoformans/gattii NOT DETECTED NOT DETECTED Final   CTX-M ESBL NOT DETECTED NOT DETECTED Final   Carbapenem resistance IMP NOT DETECTED NOT DETECTED Final   Carbapenem resistance KPC NOT DETECTED NOT DETECTED Final   Carbapenem resistance NDM NOT DETECTED NOT DETECTED Final   Carbapenem resist OXA 48 LIKE NOT DETECTED NOT DETECTED Final   Carbapenem resistance VIM NOT DETECTED NOT DETECTED Final    Comment: Performed at [facility], [address], [place], [place] [address]  Resp Panel by RT-PCR (Flu A&B, Covid) Nasopharyngeal Swab     Status: None   Collection Time: 09/17/20 11:37 PM   Specimen: Nasopharyngeal Swab; Nasopharyngeal(NP) swabs in vial transport medium  Result Value Ref Range Status   SARS Coronavirus 2 by RT PCR NEGATIVE NEGATIVE Final    Comment: (NOTE) SARS-CoV-2 target nucleic acids are NOT DETECTED.  The SARS-CoV-2 RNA is generally detectable in upper respiratory specimens during the acute phase of infection. The lowest concentration of SARS-CoV-2 viral copies this assay can detect is 138 copies/mL. A negative result does not preclude SARS-Cov-2 infection and should not be used as the sole basis for treatment or other patient management decisions. A negative result may occur with  improper specimen collection/handling, submission of specimen other than nasopharyngeal swab, presence of viral mutation(s) within the areas targeted by this assay, and inadequate number of viral copies(<138 copies/mL). A negative result must be combined with clinical observations, patient history, and epidemiological information. The expected result is Negative.  Fact Sheet for Patients:  EntrepreneurPulse.com.au  Fact Sheet  for Healthcare Providers:  IncredibleEmployment.be  This test is no t yet approved or cleared by the [place] FDA and  has been authorized for detection and/or diagnosis of SARS-CoV-2 by FDA under an Emergency Use Authorization (EUA). This EUA will remain  in effect (meaning this test can be used) for the duration of the COVID-19 declaration under Section 564(b)(1) of the Act, 21 U.S.C.section 360bbb-3(b)(1), unless the authorization is terminated  or revoked sooner.       Influenza A by PCR NEGATIVE NEGATIVE Final   Influenza B by PCR NEGATIVE NEGATIVE Final    Comment: (NOTE) The Xpert Xpress SARS-CoV-2/FLU/RSV plus assay is intended as an aid in the diagnosis of influenza from Nasopharyngeal swab specimens and should not be used as a sole basis for treatment. Nasal washings and aspirates are unacceptable for Xpert Xpress SARS-CoV-2/FLU/RSV testing.  Fact Sheet for Patients: EntrepreneurPulse.com.au  Fact Sheet for Healthcare Providers: IncredibleEmployment.be  This test is not yet approved or cleared by the [place] FDA and has been authorized for detection and/or diagnosis of SARS-CoV-2 by FDA under an Emergency Use Authorization (EUA). This EUA will remain in effect (meaning this test can be used) for the duration of the COVID-19 declaration under Section 564(b)(1) of the Act, 21 U.S.C. section 360bbb-3(b)(1), unless the authorization is terminated or revoked.  Performed at [facility], [place] [address] [place], [place] [address]   Urine culture     Status: Abnormal   Collection Time: 09/18/20 12:40 AM   Specimen: In/Out Cath Urine  Result Value Ref Range Status  Specimen Description   Final    IN/OUT CATH URINE Performed at [facility] [address] [place], [place] [address]    Special Requests   Final    NONE Performed at [facility],  [place] [address], [place], [place] [address]    Culture >=100,000 COLONIES/mL ESCHERICHIA COLI (A)  Final   Report Status 09/20/2020 FINAL  Final   Organism ID, Bacteria ESCHERICHIA COLI (A)  Final      Susceptibility   Escherichia coli - MIC*    AMPICILLIN 4 SENSITIVE Sensitive     CEFAZOLIN <=4 SENSITIVE Sensitive     CEFEPIME <=0.12 SENSITIVE Sensitive     CEFTRIAXONE <=0.25 SENSITIVE Sensitive     CIPROFLOXACIN <=0.25 SENSITIVE Sensitive     GENTAMICIN <=1 SENSITIVE Sensitive     IMIPENEM <=0.25 SENSITIVE Sensitive     NITROFURANTOIN <=16 SENSITIVE Sensitive     TRIMETH/SULFA <=20 SENSITIVE Sensitive     AMPICILLIN/SULBACTAM <=2 SENSITIVE Sensitive     PIP/TAZO <=4 SENSITIVE Sensitive     * >=100,000 COLONIES/mL ESCHERICHIA COLI  MRSA PCR Screening     Status: None   Collection Time: 09/18/20  1:40 PM   Specimen: Nasopharyngeal  Result Value Ref Range Status   MRSA by PCR NEGATIVE NEGATIVE Final    Comment:        The GeneXpert MRSA Assay (FDA approved for NASAL specimens only), is one component of a comprehensive MRSA colonization surveillance program. It is not intended to diagnose MRSA infection nor to guide or monitor treatment for MRSA infections. Performed at [facility], [place] [address] [place], [place] [address]      Radiology Studies: No results found.  Scheduled Meds: . Chlorhexidine Gluconate Cloth  6 each Topical Daily  . insulin aspart  0-15 Units Subcutaneous TID WC  . insulin aspart  0-5 Units Subcutaneous QHS   Continuous Infusions: . sodium chloride    . cefTRIAXone (ROCEPHIN)  IV Stopped (09/19/20 2141)  . dextrose Stopped (09/20/20 1442)     LOS: 2 days   Marylu Lund, MD [facility] Pager On Amion  If 7PM-7AM, please contact night-coverage 09/20/2020, 5:51 PM

## 2020-09-20 NOTE — Progress Notes (Addendum)
Patient ID: Hjalmer Iovino, male   DOB: 1946/01/15, 74 y.o.   MRN: [id number]  2 Days Post-Op Subjective: Pt feeling better.  Still some confusion.  No pain.  Objective: Vital signs in last 24 hours: Temp:  [97.3 F (36.3 C)-98.1 F (36.7 C)] 98.1 F (36.7 C) (12/17 1310) Pulse Rate:  [68-103] 99 (12/17 1310) Resp:  [10-17] 12 (12/17 1310) BP: (111-144)/(61-90) 139/79 (12/17 1310) SpO2:  [92 %-100 %] 100 % (12/17 1310)  Intake/Output from previous day: 12/16 0701 - 12/17 0700 In: 2851.4 [P.O.:600; I.V.:2151.4; IV Piggyback:100] Out: 2000 [Urine:2000] Intake/Output this shift: Total I/O In: 455.3 [I.V.:455.3] Out: 550 [Urine:550]  Physical Exam:  General: Alert and oriented Abd: No CVAT.  Lab Results: Recent Labs    09/17/20 2314 09/19/20 0239 09/20/20 0235  HGB 13.8 12.6* 12.4*  HCT 41.6 39.2 39.4   CBC Latest Ref Rng & Units 09/20/2020 09/19/2020 09/18/2020  WBC 4.0 - 10.5 K/uL 12.9(H) 21.9(H) -  Hemoglobin 13.0 - 17.0 g/dL 12.4(L) 12.6(L) -  Hematocrit 39.0 - 52.0 % 39.4 39.2 -  Platelets 150 - 400 K/uL 34(L) 34(L) 39(L)     BMET Recent Labs    09/19/20 0239 09/20/20 0235  NA 152* 158*  K 3.9 4.1  CL 119* 124*  CO2 23 24  GLUCOSE 186* 133*  BUN 151* 102*  CREATININE 2.47* 1.89*  CALCIUM 8.3* 8.9     Studies/Results: Urine culture: Pansensitive E coli  Assessment/Plan: 1) Right ureteral calculi and sepsis: S/P stent 12/15.  Transition to po culture specific antibiotics and would recommend about 2 weeks of therapy.  I will arrange outpatient follow up after he completes antibiotic therapy to discuss proceeding with definitive ureteroscopic therapy. Please call if further questions. 2) AKI: Resolving.   LOS: 2 days   Dutch Gray 09/20/2020, 6:09 PM

## 2020-09-21 LAB — CBC WITH DIFFERENTIAL/PLATELET
Abs Immature Granulocytes: 0.07 10*3/uL (ref 0.00–0.07)
Basophils Absolute: 0 10*3/uL (ref 0.0–0.1)
Basophils Relative: 0 %
Eosinophils Absolute: 0.1 10*3/uL (ref 0.0–0.5)
Eosinophils Relative: 1 %
HCT: 34 % — ABNORMAL LOW (ref 39.0–52.0)
Hemoglobin: 10.7 g/dL — ABNORMAL LOW (ref 13.0–17.0)
Immature Granulocytes: 1 %
Lymphocytes Relative: 14 %
Lymphs Abs: 1.2 10*3/uL (ref 0.7–4.0)
MCH: 26.9 pg (ref 26.0–34.0)
MCHC: 31.5 g/dL (ref 30.0–36.0)
MCV: 85.4 fL (ref 80.0–100.0)
Monocytes Absolute: 0.7 10*3/uL (ref 0.1–1.0)
Monocytes Relative: 9 %
Neutro Abs: 6 10*3/uL (ref 1.7–7.7)
Neutrophils Relative %: 75 %
Platelets: 41 10*3/uL — ABNORMAL LOW (ref 150–400)
RBC: 3.98 MIL/uL — ABNORMAL LOW (ref 4.22–5.81)
RDW: 15 % (ref 11.5–15.5)
WBC: 8 10*3/uL (ref 4.0–10.5)
nRBC: 0 % (ref 0.0–0.2)

## 2020-09-21 LAB — GLUCOSE, CAPILLARY
Glucose-Capillary: 205 mg/dL — ABNORMAL HIGH (ref 70–99)
Glucose-Capillary: 169 mg/dL — ABNORMAL HIGH (ref 70–99)
Glucose-Capillary: 196 mg/dL — ABNORMAL HIGH (ref 70–99)
Glucose-Capillary: 131 mg/dL — ABNORMAL HIGH (ref 70–99)
Glucose-Capillary: 159 mg/dL — ABNORMAL HIGH (ref 70–99)

## 2020-09-21 LAB — BASIC METABOLIC PANEL
Anion gap: 11 (ref 5–15)
BUN: 63 mg/dL — ABNORMAL HIGH (ref 8–23)
CO2: 25 mmol/L (ref 22–32)
Calcium: 8.5 mg/dL — ABNORMAL LOW (ref 8.9–10.3)
Chloride: 119 mmol/L — ABNORMAL HIGH (ref 98–111)
Creatinine, Ser: 1.39 mg/dL — ABNORMAL HIGH (ref 0.61–1.24)
GFR, Estimated: 54 mL/min — ABNORMAL LOW (ref >60)
Glucose, Bld: 237 mg/dL — ABNORMAL HIGH (ref 70–99)
Potassium: 4.2 mmol/L (ref 3.5–5.1)
Sodium: 155 mmol/L — ABNORMAL HIGH (ref 135–145)

## 2020-09-21 MED ORDER — ALBUMIN HUMAN 25 % IV SOLN
25.0000 g | Freq: Once | INTRAVENOUS | Status: AC
  Administered 2020-09-21: 16:00:00 25 g via INTRAVENOUS
  Filled 2020-09-21: qty 100

## 2020-09-21 MED ORDER — PROMETHAZINE HCL 25 MG/ML IJ SOLN
12.5000 mg | Freq: Four times a day (QID) | INTRAMUSCULAR | Status: DC | PRN
  Administered 2020-09-21 – 2020-09-23 (×2): 12.5 mg via INTRAVENOUS
  Filled 2020-09-21 (×2): qty 1

## 2020-09-21 NOTE — Progress Notes (Signed)
PROGRESS NOTE    Edward Wells  NOM:[id number] DOB: 08-05-1946 DOA: 09/17/2020 PCP: Elizabeth Palau, MD (Inactive)    Brief Narrative:  74yo who presented with septic shock from pyelonephritis, later undergoing cystoscopy and resultant R ureteral stent placement  Assessment & Plan:   Active Problems:   Pyelonephritis  1. Septic shock from ecoli bacteremia and pyelonephritis present on admit 1. Blood and urine cx pos for ecoli 2. Currently on rocephin 3. Urology following and is now s/p stent placement 4. Per Urology, recommendation for 14 days of abx 5. Recheck bmet and cbc in AM 2. Thrombocytopenia 1. Now slowly improving 2. Likely secondary to presenting septic shock 3. Hypernatremia 1. Continued on IVF 2. Will give trial of IV albumin given ammonia of around 2 3. Check cmp in AM 4. Afib with RVR 1. Transient and resolved 2. Likely secondary to sepsis 5. New diagnosis of DM 1. Continue on SSI coverage  DVT prophylaxis: SCD's Code Status: Full Family Communication: Pt in room, family at bedside  Status is: Inpatient  Remains inpatient appropriate because:IV treatments appropriate due to intensity of illness or inability to take PO and Inpatient level of care appropriate due to severity of illness   Dispo: The patient is from: Home              Anticipated d/c is to: Home              Anticipated d/c date is: 2 days              Patient currently is not medically stable to d/c.   Consultants:   Urology  PCCM  Procedures:   Cystoscopy with stent placement  Antimicrobials: Anti-infectives (From admission, onward)   Start     Dose/Rate Route Frequency Ordered Stop   09/18/20 2200  ceFEPIme (MAXIPIME) 2 g in sodium chloride 0.9 % 100 mL IVPB  Status:  Discontinued        2 g 200 mL/hr over 30 Minutes Intravenous Every 24 hours 09/18/20 1108 09/18/20 1827   09/18/20 2200  cefTRIAXone (ROCEPHIN) 2 g in sodium chloride 0.9 % 100 mL IVPB        2 g 200  mL/hr over 30 Minutes Intravenous Every 24 hours 09/18/20 1827     09/18/20 0000  vancomycin (VANCOREADY) IVPB 2000 mg/400 mL        2,000 mg 200 mL/hr over 120 Minutes Intravenous  Once 09/17/20 2356 09/18/20 0216   09/18/20 0000  ceFEPIme (MAXIPIME) 2 g in sodium chloride 0.9 % 100 mL IVPB        2 g 200 mL/hr over 30 Minutes Intravenous  Once 09/17/20 2356 09/18/20 0107      Subjective: Without complaints this AM  Objective: Vitals:   09/20/20 1310 09/20/20 1817 09/20/20 2101 09/21/20 0447  BP: 139/79 (!) 149/73 119/67 119/74  Pulse: 99 79 91 84  Resp: 12 18 18 18   Temp: 98.1 F (36.7 C) 98 F (36.7 C) (!) 97.5 F (36.4 C) 97.6 F (36.4 C)  TempSrc: Oral Oral Oral Oral  SpO2: 100% (!) 31% 100% 100%  Weight:      Height:        Intake/Output Summary (Last 24 hours) at 09/21/2020 1445 Last data filed at 09/21/2020 1200 Gross per 24 hour  Intake 2807.38 ml  Output 2300 ml  Net 507.38 ml   Filed Weights   09/18/20 1103 09/18/20 1400  Weight: 103.9 kg 94.8 kg    Examination:  General exam: Awake, laying in bed, in nad Respiratory system: Normal respiratory effort, no wheezing Cardiovascular system: regular rate, s1, s2 Gastrointestinal system: Soft, nondistended, positive BS Central nervous system: CN2-12 grossly intact, strength intact Extremities: Perfused, no clubbing Skin: Normal skin turgor, no notable skin lesions seen Psychiatry: Mood normal // no visual hallucinations   Data Reviewed: I have personally reviewed following labs and imaging studies  CBC: Recent Labs  Lab 09/17/20 2314 09/18/20 0929 09/19/20 0239 09/20/20 0235 09/21/20 0543  WBC 47.3*  --  21.9* 12.9* 8.0  NEUTROABS 43.9*  --  19.6* 10.9* 6.0  HGB 13.8  --  12.6* 12.4* 10.7*  HCT 41.6  --  39.2 39.4 34.0*  MCV 81.3  --  83.6 84.7 85.4  PLT 50* 39* 34* 34* 41*   Basic Metabolic Panel: Recent Labs  Lab 09/17/20 2314 09/18/20 0929 09/19/20 0239 09/20/20 0235 09/21/20 0543  NA  146* 150* 152* 158* 155*  K 3.9 3.8 3.9 4.1 4.2  CL 114* 117* 119* 124* 119*  CO2 18* 20* 23 24 25   GLUCOSE 308* 221* 186* 133* 237*  BUN 211* 193* 151* 102* 63*  CREATININE 3.61* 3.03* 2.47* 1.89* 1.39*  CALCIUM 7.7* 7.8* 8.3* 8.9 8.5*  MG 3.3*  --   --   --   --    GFR: Estimated Creatinine Clearance: 57.4 mL/min (A) (by C-G formula based on SCr of 1.39 mg/dL (H)). Liver Function Tests: Recent Labs  Lab 09/17/20 2314 09/18/20 0929 09/19/20 0239  AST 13* 11* 12*  ALT 20 18 18   ALKPHOS 101 344* 93  BILITOT 0.8 1.0 0.5  PROT 6.0* 4.9* 5.3*  ALBUMIN 2.4* 1.9* 2.0*   Recent Labs  Lab 09/17/20 2314  LIPASE 35   No results for input(s): AMMONIA in the last 168 hours. Coagulation Profile: Recent Labs  Lab 09/18/20 0929  INR 1.5*   Cardiac Enzymes: No results for input(s): CKTOTAL, CKMB, CKMBINDEX, TROPONINI in the last 168 hours. BNP (last 3 results) No results for input(s): PROBNP in the last 8760 hours. HbA1C: No results for input(s): HGBA1C in the last 72 hours. CBG: Recent Labs  Lab 09/20/20 2058 09/20/20 2347 09/21/20 0443 09/21/20 0739 09/21/20 1134  GLUCAP 186* 190* 205* 169* 196*   Lipid Profile: No results for input(s): CHOL, HDL, LDLCALC, TRIG, CHOLHDL, LDLDIRECT in the last 72 hours. Thyroid Function Tests: No results for input(s): TSH, T4TOTAL, FREET4, T3FREE, THYROIDAB in the last 72 hours. Anemia Panel: No results for input(s): VITAMINB12, FOLATE, FERRITIN, TIBC, IRON, RETICCTPCT in the last 72 hours. Sepsis Labs: Recent Labs  Lab 09/17/20 0320 09/17/20 2314 09/18/20 0929  LATICACIDVEN 2.0* 3.1* 1.8    Recent Results (from the past 240 hour(s))  Blood Culture (routine x 2)     Status: Abnormal   Collection Time: 09/17/20 11:14 PM   Specimen: BLOOD  Result Value Ref Range Status   Specimen Description   Final    BLOOD RIGHT ANTECUBITAL Performed at [facility] [address], [place], [place] [address]    Special  Requests   Final    BOTTLES DRAWN AEROBIC AND ANAEROBIC Blood Culture adequate volume Performed at [facility] [address] [place], [place] [address]    Culture  Setup Time   Final    GRAM NEGATIVE RODS IN BOTH AEROBIC AND ANAEROBIC BOTTLES Organism ID to follow CRITICAL RESULT CALLED TO, READ BACK BY AND VERIFIED WITHKeturah Barre [facility] PHARMD 1619 09/17/20 A BROWNING Performed  at [facility], [place] [address] [place], [place] [address]    Culture ESCHERICHIA COLI (A)  Final   Report Status 09/20/2020 FINAL  Final   Organism ID, Bacteria ESCHERICHIA COLI  Final      Susceptibility   Escherichia coli - MIC*    AMPICILLIN 4 SENSITIVE Sensitive     CEFAZOLIN <=4 SENSITIVE Sensitive     CEFEPIME <=0.12 SENSITIVE Sensitive     CEFTAZIDIME <=1 SENSITIVE Sensitive     CEFTRIAXONE <=0.25 SENSITIVE Sensitive     CIPROFLOXACIN <=0.25 SENSITIVE Sensitive     GENTAMICIN <=1 SENSITIVE Sensitive     IMIPENEM <=0.25 SENSITIVE Sensitive     TRIMETH/SULFA <=20 SENSITIVE Sensitive     AMPICILLIN/SULBACTAM <=2 SENSITIVE Sensitive     PIP/TAZO <=4 SENSITIVE Sensitive     * ESCHERICHIA COLI  Blood Culture (routine x 2)     Status: Abnormal   Collection Time: 09/17/20 11:14 PM   Specimen: BLOOD  Result Value Ref Range Status   Specimen Description   Final    BLOOD LEFT ANTECUBITAL Performed at [facility] [address] [place], [place] [address]    Special Requests   Final    BOTTLES DRAWN AEROBIC AND ANAEROBIC Blood Culture adequate volume Performed at [facility] [address] [place], [place] [address]    Culture  Setup Time   Final    GRAM NEGATIVE RODS IN BOTH AEROBIC AND ANAEROBIC BOTTLES CRITICAL VALUE NOTED.  VALUE IS CONSISTENT WITH PREVIOUSLY REPORTED AND CALLED VALUE.    Culture (A)  Final    ESCHERICHIA COLI SUSCEPTIBILITIES PERFORMED ON PREVIOUS CULTURE WITHIN THE LAST 5 DAYS. Performed at [facility], [place] [address], [place],  [address]    Report Status 09/20/2020 FINAL  Final  Blood Culture ID Panel (Reflexed)     Status: Abnormal   Collection Time: 09/17/20 11:14 PM  Result Value Ref Range Status   Enterococcus faecalis NOT DETECTED NOT DETECTED Final   Enterococcus Faecium NOT DETECTED NOT DETECTED Final   Listeria monocytogenes NOT DETECTED NOT DETECTED Final   Staphylococcus species NOT DETECTED NOT DETECTED Final   Staphylococcus aureus (BCID) NOT DETECTED NOT DETECTED Final   Staphylococcus epidermidis NOT DETECTED NOT DETECTED Final   Staphylococcus lugdunensis NOT DETECTED NOT DETECTED Final   Streptococcus species NOT DETECTED NOT DETECTED Final   Streptococcus agalactiae NOT DETECTED NOT DETECTED Final   Streptococcus pneumoniae NOT DETECTED NOT DETECTED Final   Streptococcus pyogenes NOT DETECTED NOT DETECTED Final   A.calcoaceticus-baumannii NOT DETECTED NOT DETECTED Final   Bacteroides fragilis NOT DETECTED NOT DETECTED Final   Enterobacterales DETECTED (A) NOT DETECTED Final    Comment: Enterobacterales represent a large order of gram negative bacteria, not a single organism. CRITICAL RESULT CALLED TO, READ BACK BY AND VERIFIED WITH: D [facility] PHARMD 1619 09/17/20 A BROWNING    Enterobacter cloacae complex NOT DETECTED NOT DETECTED Final   Escherichia coli DETECTED (A) NOT DETECTED Final    Comment: CRITICAL RESULT CALLED TO, READ BACK BY AND VERIFIED WITH: D [facility] PHARMD 1619 09/17/20 A BROWNING    Klebsiella aerogenes NOT DETECTED NOT DETECTED Final   Klebsiella oxytoca NOT DETECTED NOT DETECTED Final   Klebsiella pneumoniae NOT DETECTED NOT DETECTED Final   Proteus species NOT DETECTED NOT DETECTED Final   Salmonella species NOT DETECTED NOT DETECTED Final   Serratia marcescens NOT DETECTED NOT DETECTED Final   Haemophilus influenzae NOT DETECTED NOT DETECTED  Final   Neisseria meningitidis NOT DETECTED NOT DETECTED Final   Pseudomonas aeruginosa NOT  DETECTED NOT DETECTED Final   Stenotrophomonas maltophilia NOT DETECTED NOT DETECTED Final   Candida albicans NOT DETECTED NOT DETECTED Final   Candida auris NOT DETECTED NOT DETECTED Final   Candida glabrata NOT DETECTED NOT DETECTED Final   Candida krusei NOT DETECTED NOT DETECTED Final   Candida parapsilosis NOT DETECTED NOT DETECTED Final   Candida tropicalis NOT DETECTED NOT DETECTED Final   Cryptococcus neoformans/gattii NOT DETECTED NOT DETECTED Final   CTX-M ESBL NOT DETECTED NOT DETECTED Final   Carbapenem resistance IMP NOT DETECTED NOT DETECTED Final   Carbapenem resistance KPC NOT DETECTED NOT DETECTED Final   Carbapenem resistance NDM NOT DETECTED NOT DETECTED Final   Carbapenem resist OXA 48 LIKE NOT DETECTED NOT DETECTED Final   Carbapenem resistance VIM NOT DETECTED NOT DETECTED Final    Comment: Performed at [facility], [address] [place], [place] [address]  Resp Panel by RT-PCR (Flu A&B, Covid) Nasopharyngeal Swab     Status: None   Collection Time: 09/17/20 11:37 PM   Specimen: Nasopharyngeal Swab; Nasopharyngeal(NP) swabs in vial transport medium  Result Value Ref Range Status   SARS Coronavirus 2 by RT PCR NEGATIVE NEGATIVE Final    Comment: (NOTE) SARS-CoV-2 target nucleic acids are NOT DETECTED.  The SARS-CoV-2 RNA is generally detectable in upper respiratory specimens during the acute phase of infection. The lowest concentration of SARS-CoV-2 viral copies this assay can detect is 138 copies/mL. A negative result does not preclude SARS-Cov-2 infection and should not be used as the sole basis for treatment or other patient management decisions. A negative result may occur with  improper specimen collection/handling, submission of specimen other than nasopharyngeal swab, presence of viral mutation(s) within the areas targeted by this assay, and inadequate number of viral copies(<138 copies/mL). A negative result must be combined with clinical  observations, patient history, and epidemiological information. The expected result is Negative.  Fact Sheet for Patients:  EntrepreneurPulse.com.au  Fact Sheet for Healthcare Providers:  IncredibleEmployment.be  This test is no t yet approved or cleared by the [place] FDA and  has been authorized for detection and/or diagnosis of SARS-CoV-2 by FDA under an Emergency Use Authorization (EUA). This EUA will remain  in effect (meaning this test can be used) for the duration of the COVID-19 declaration under Section 564(b)(1) of the Act, 21 U.S.C.section 360bbb-3(b)(1), unless the authorization is terminated  or revoked sooner.       Influenza A by PCR NEGATIVE NEGATIVE Final   Influenza B by PCR NEGATIVE NEGATIVE Final    Comment: (NOTE) The Xpert Xpress SARS-CoV-2/FLU/RSV plus assay is intended as an aid in the diagnosis of influenza from Nasopharyngeal swab specimens and should not be used as a sole basis for treatment. Nasal washings and aspirates are unacceptable for Xpert Xpress SARS-CoV-2/FLU/RSV testing.  Fact Sheet for Patients: EntrepreneurPulse.com.au  Fact Sheet for Healthcare Providers: IncredibleEmployment.be  This test is not yet approved or cleared by the [place] FDA and has been authorized for detection and/or diagnosis of SARS-CoV-2 by FDA under an Emergency Use Authorization (EUA). This EUA will remain in effect (meaning this test can be used) for the duration of the COVID-19 declaration under Section 564(b)(1) of the Act, 21 U.S.C. section 360bbb-3(b)(1), unless the authorization is terminated or revoked.  Performed at [facility], [place] [address], [place], [place] [address]   Urine culture  Status: Abnormal   Collection Time: 09/18/20 12:40 AM   Specimen: In/Out Cath Urine  Result Value Ref Range Status   Specimen Description   Final    IN/OUT CATH  URINE Performed at [facility], [place] [address] [place], [place] [address]    Special Requests   Final    NONE Performed at [facility], [place] [address], [place], [place] [address]    Culture >=100,000 COLONIES/mL ESCHERICHIA COLI (A)  Final   Report Status 09/20/2020 FINAL  Final   Organism ID, Bacteria ESCHERICHIA COLI (A)  Final      Susceptibility   Escherichia coli - MIC*    AMPICILLIN 4 SENSITIVE Sensitive     CEFAZOLIN <=4 SENSITIVE Sensitive     CEFEPIME <=0.12 SENSITIVE Sensitive     CEFTRIAXONE <=0.25 SENSITIVE Sensitive     CIPROFLOXACIN <=0.25 SENSITIVE Sensitive     GENTAMICIN <=1 SENSITIVE Sensitive     IMIPENEM <=0.25 SENSITIVE Sensitive     NITROFURANTOIN <=16 SENSITIVE Sensitive     TRIMETH/SULFA <=20 SENSITIVE Sensitive     AMPICILLIN/SULBACTAM <=2 SENSITIVE Sensitive     PIP/TAZO <=4 SENSITIVE Sensitive     * >=100,000 COLONIES/mL ESCHERICHIA COLI  MRSA PCR Screening     Status: None   Collection Time: 09/18/20  1:40 PM   Specimen: Nasopharyngeal  Result Value Ref Range Status   MRSA by PCR NEGATIVE NEGATIVE Final    Comment:        The GeneXpert MRSA Assay (FDA approved for NASAL specimens only), is one component of a comprehensive MRSA colonization surveillance program. It is not intended to diagnose MRSA infection nor to guide or monitor treatment for MRSA infections. Performed at [facility], [place] [address] [place], [place] [address]      Radiology Studies: No results found.  Scheduled Meds: . Chlorhexidine Gluconate Cloth  6 each Topical Daily  . insulin aspart  0-15 Units Subcutaneous TID WC  . insulin aspart  0-5 Units Subcutaneous QHS   Continuous Infusions: . sodium chloride    . albumin human    . cefTRIAXone (ROCEPHIN)  IV 2 g (09/20/20 2205)  . dextrose 100 mL/hr at 09/21/20 1308     LOS: 3 days   Marylu Lund, MD [facility] Pager On Amion  If 7PM-7AM,  please contact night-coverage 09/21/2020, 2:45 PM

## 2020-09-21 NOTE — Evaluation (Signed)
Occupational Therapy Evaluation Patient Details Name: Edward Wells MRN: [id number] DOB: December 02, 1945 Today's Date: 09/21/2020    History of Present Illness 74yo who presented with septic shock from pyelonephritis, later undergoing cystoscopy and resultant R ureteral stent placement   Clinical Impression   Mr. Levorn Oleski is a 74 year old man who presents with above medical history, generalized weakness and decreased activity tolerance. On evaluation he demonstrates functional strength of upper extremities, ability to perform functional mobility with RW and min guard in room and hallway and ability to perform all ADLs with set up and supervision. Patient reports subjectively that he feels at "55% back to normal" in regards to strength and ability. Patient will benefit from skilled OT services while in hospital to improve deficits and learn compensatory strategies as needed in order to return to PLOF.      Follow Up Recommendations  No OT follow up    Equipment Recommendations  Toilet rise with handles    Recommendations for Other Services       Precautions / Restrictions Precautions Precautions: Fall Restrictions Weight Bearing Restrictions: No      Mobility Bed Mobility Overal bed mobility: Modified Independent             General bed mobility comments: increased time    Transfers Overall transfer level: Needs assistance Equipment used: Rolling walker (2 wheeled) Transfers: Sit to/from Stand Sit to Stand: Min guard;From elevated surface         General transfer comment: Min guard to ambulate 100 feet in hall with RW.    Balance Overall balance assessment: Mild deficits observed, not formally tested                                         ADL either performed or assessed with clinical judgement   ADL Overall ADL's : Needs assistance/impaired Eating/Feeding: Independent   Grooming: Independent   Upper Body Bathing: Set up;Sitting   Lower  Body Bathing: Min guard;Sit to/from stand;Set up   Upper Body Dressing : Set up;Sitting   Lower Body Dressing: Set up;Min guard;Sit to/from stand   Toilet Transfer: Supervision/safety;Regular Toilet;Grab bars;RW   Toileting- Water quality scientist and Hygiene: Supervision/safety;Sit to/from stand               Vision Baseline Vision/History: Wears glasses Wears Glasses: At all times Patient Visual Report: No change from baseline       Perception     Praxis      Pertinent Vitals/Pain Pain Assessment: No/denies pain     Hand Dominance Right   Extremity/Trunk Assessment Upper Extremity Assessment Upper Extremity Assessment: Overall WFL for tasks assessed   Lower Extremity Assessment Lower Extremity Assessment: Defer to PT evaluation   Cervical / Trunk Assessment Cervical / Trunk Assessment: Normal   Communication Communication Communication: No difficulties   Cognition Arousal/Alertness: Awake/alert Behavior During Therapy: WFL for tasks assessed/performed Overall Cognitive Status: Within Functional Limits for tasks assessed                                     General Comments       Exercises     Shoulder Instructions      Home Living Family/patient expects to be discharged to:: Private residence Living Arrangements: Alone Available Help at Discharge: Available PRN/intermittently;Neighbor Type of Home:  House Home Access: Level entry     Home Layout: One level     Bathroom Shower/Tub:  (walk in tub)   Bathroom Toilet: Standard     Home Equipment: None   Additional Comments: Has a niece that is town to "take care of some things." Potential to return to [place] with her.      Prior Functioning/Environment Level of Independence: Independent                 OT Problem List: Decreased activity tolerance;Decreased knowledge of use of DME or AE;Increased edema      OT Treatment/Interventions: Self-care/ADL training;DME  and/or AE instruction;Patient/family education;Therapeutic activities    OT Goals(Current goals can be found in the care plan section) Acute Rehab OT Goals Patient Stated Goal: to get stronger OT Goal Formulation: With patient Time For Goal Achievement: 10/05/20 Potential to Achieve Goals: Good  OT Frequency: Min 2X/week   Barriers to D/C: Decreased caregiver support          Co-evaluation              AM-PAC OT "6 Clicks" Daily Activity     Outcome Measure Help from another person eating meals?: None Help from another person taking care of personal grooming?: None Help from another person toileting, which includes using toliet, bedpan, or urinal?: A Little Help from another person bathing (including washing, rinsing, drying)?: A Little Help from another person to put on and taking off regular upper body clothing?: A Little Help from another person to put on and taking off regular lower body clothing?: A Little 6 Click Score: 20   End of Session Equipment Utilized During Treatment: Rolling walker Nurse Communication: Mobility status  Activity Tolerance: Patient tolerated treatment well Patient left: in chair;with call bell/phone within reach  OT Visit Diagnosis: Muscle weakness (generalized) (M62.81)                Time: 5997-7414 OT Time Calculation (min): 39 min Charges:  OT General Charges $OT Visit: 1 Visit OT Evaluation $OT Eval Moderate Complexity: 1 Mod OT Treatments $Self Care/Home Management : 8-22 mins  Gwen Sarvis, OTR/L [place]  Office [phone] Pager: [phone]   Lenward Chancellor 09/21/2020, 3:42 PM

## 2020-09-21 NOTE — Plan of Care (Signed)
Progressing

## 2020-09-21 NOTE — Plan of Care (Signed)
Patient takes deep breaths and coughs.

## 2020-09-22 ENCOUNTER — Encounter (HOSPITAL_COMMUNITY): Payer: Self-pay | Admitting: Pulmonary Disease

## 2020-09-22 LAB — CBC WITH DIFFERENTIAL/PLATELET
Abs Immature Granulocytes: 0 10*3/uL (ref 0.00–0.07)
Band Neutrophils: 0 %
Basophils Absolute: 0 10*3/uL (ref 0.0–0.1)
Basophils Relative: 0 %
Blasts: 0 %
Eosinophils Absolute: 0.1 10*3/uL (ref 0.0–0.5)
Eosinophils Relative: 1 %
HCT: 31.7 % — ABNORMAL LOW (ref 39.0–52.0)
Hemoglobin: 9.6 g/dL — ABNORMAL LOW (ref 13.0–17.0)
Lymphocytes Relative: 16 %
Lymphs Abs: 1.1 10*3/uL (ref 0.7–4.0)
MCH: 26.5 pg (ref 26.0–34.0)
MCHC: 30.3 g/dL (ref 30.0–36.0)
MCV: 87.6 fL (ref 80.0–100.0)
Metamyelocytes Relative: 0 %
Monocytes Absolute: 0.6 10*3/uL (ref 0.1–1.0)
Monocytes Relative: 9 %
Myelocytes: 0 %
Neutro Abs: 5.3 10*3/uL (ref 1.7–7.7)
Neutrophils Relative %: 74 %
Other: 0 %
Platelets: 63 10*3/uL — ABNORMAL LOW (ref 150–400)
Promyelocytes Relative: 0 %
RBC: 3.62 MIL/uL — ABNORMAL LOW (ref 4.22–5.81)
RDW: 14.9 % (ref 11.5–15.5)
WBC: 7.1 10*3/uL (ref 4.0–10.5)
nRBC: 0 % (ref 0.0–0.2)
nRBC: 0 /100 WBC

## 2020-09-22 LAB — COMPREHENSIVE METABOLIC PANEL
ALT: 32 U/L (ref 0–44)
AST: 23 U/L (ref 15–41)
Albumin: 2.3 g/dL — ABNORMAL LOW (ref 3.5–5.0)
Alkaline Phosphatase: 55 U/L (ref 38–126)
Anion gap: 10 (ref 5–15)
BUN: 33 mg/dL — ABNORMAL HIGH (ref 8–23)
CO2: 26 mmol/L (ref 22–32)
Calcium: 8.6 mg/dL — ABNORMAL LOW (ref 8.9–10.3)
Chloride: 117 mmol/L — ABNORMAL HIGH (ref 98–111)
Creatinine, Ser: 1.18 mg/dL (ref 0.61–1.24)
GFR, Estimated: >60 mL/min (ref >60)
Glucose, Bld: 203 mg/dL — ABNORMAL HIGH (ref 70–99)
Potassium: 4 mmol/L (ref 3.5–5.1)
Sodium: 153 mmol/L — ABNORMAL HIGH (ref 135–145)
Total Bilirubin: 0.8 mg/dL (ref 0.3–1.2)
Total Protein: 5.3 g/dL — ABNORMAL LOW (ref 6.5–8.1)

## 2020-09-22 LAB — MAGNESIUM: Magnesium: 1.7 mg/dL (ref 1.7–2.4)

## 2020-09-22 LAB — GLUCOSE, CAPILLARY
Glucose-Capillary: 175 mg/dL — ABNORMAL HIGH (ref 70–99)
Glucose-Capillary: 132 mg/dL — ABNORMAL HIGH (ref 70–99)
Glucose-Capillary: 158 mg/dL — ABNORMAL HIGH (ref 70–99)
Glucose-Capillary: 150 mg/dL — ABNORMAL HIGH (ref 70–99)

## 2020-09-22 MED ORDER — ALBUMIN HUMAN 25 % IV SOLN
25.0000 g | Freq: Once | INTRAVENOUS | Status: AC
  Administered 2020-09-22: 11:00:00 25 g via INTRAVENOUS
  Filled 2020-09-22: qty 100

## 2020-09-22 NOTE — Plan of Care (Signed)
  Problem: Clinical Measurements: Goal: Ability to maintain clinical measurements within normal limits will improve Outcome: Progressing Goal: Will remain free from infection Outcome: Progressing   Problem: Pain Managment: Goal: General experience of comfort will improve Outcome: Progressing

## 2020-09-22 NOTE — Evaluation (Signed)
Physical Therapy Evaluation Patient Details Name: Edward Wells MRN: [id number] DOB: 1946-06-15 Today's Date: 09/22/2020   History of Present Illness  74 yo who presented with septic shock from pyelonephritis, later undergoing cystoscopy and R ureteral stent placement 12/15.  Clinical Impression  On eval, pt was Min guard assist for mobility. He walked ~125 feet with a RW. Increased time to complete all tasks. Pt tolerated distance well. Appears weaker than baseline. Pt plans to d/c home once medically stable. He politely declines a RW for home. Will recommend HHPT f/u if he is agreeable to it. Will continue to follow and progress activity as tolerated.     Follow Up Recommendations Home health PT (if pt is agreeable)    Equipment Recommendations   (pt declines RW)    Recommendations for Other Services       Precautions / Restrictions Precautions Precautions: Fall Restrictions Weight Bearing Restrictions: No      Mobility  Bed Mobility Overal bed mobility: Modified Independent             General bed mobility comments: increased time    Transfers Overall transfer level: Needs assistance Equipment used: Rolling walker (2 wheeled) Transfers: Sit to/from Stand Sit to Stand: Min guard;From elevated surface         General transfer comment: Min guard for safety. Cues for safety, hand placement. Increased time.  Ambulation/Gait Ambulation/Gait assistance: Min guard Gait Distance (Feet): 125 Feet Assistive device: Rolling walker (2 wheeled) Gait Pattern/deviations: Step-through pattern;Decreased stride length;Step-to pattern     General Gait Details: Min guard for safety. Slow gait speed. Transitioned to smoother sequencing and equal step lengths towards end of distance.  Stairs            Wheelchair Mobility    Modified Rankin (Stroke Patients Only)       Balance Overall balance assessment: Needs assistance         Standing balance support:  Bilateral upper extremity supported Standing balance-Leahy Scale: Fair                               Pertinent Vitals/Pain Pain Assessment: Faces Faces Pain Scale: Hurts little more Pain Location: "all over" Pain Descriptors / Indicators: Discomfort;Sore;Aching Pain Intervention(s): Monitored during session;Limited activity within patient's tolerance;Repositioned    Home Living Family/patient expects to be discharged to:: Private residence Living Arrangements: Alone Available Help at Discharge: Available PRN/intermittently;Neighbor Type of Home: House Home Access: Level entry     Home Layout: One level Home Equipment: None Additional Comments: Has a niece that is town to "take care of some things." Potential to return to [place] with her.    Prior Function Level of Independence: Independent               Hand Dominance   Dominant Hand: Right    Extremity/Trunk Assessment   Upper Extremity Assessment Upper Extremity Assessment: Overall WFL for tasks assessed    Lower Extremity Assessment Lower Extremity Assessment: Generalized weakness    Cervical / Trunk Assessment Cervical / Trunk Assessment: Normal  Communication   Communication: No difficulties  Cognition Arousal/Alertness: Awake/alert Behavior During Therapy: WFL for tasks assessed/performed Overall Cognitive Status: Within Functional Limits for tasks assessed                                        General  Comments      Exercises     Assessment/Plan    PT Assessment Patient needs continued PT services  PT Problem List Decreased strength;Decreased mobility;Decreased activity tolerance;Decreased balance;Pain       PT Treatment Interventions DME instruction;Gait training;Therapeutic activities;Therapeutic exercise;Patient/family education;Balance training;Functional mobility training    PT Goals (Current goals can be found in the Care Plan section)  Acute Rehab PT  Goals Patient Stated Goal: to get stronger PT Goal Formulation: With patient Time For Goal Achievement: 10/06/20 Potential to Achieve Goals: Good    Frequency Min 3X/week   Barriers to discharge        Co-evaluation               AM-PAC PT "6 Clicks" Mobility  Outcome Measure Help needed turning from your back to your side while in a flat bed without using bedrails?: A Little Help needed moving from lying on your back to sitting on the side of a flat bed without using bedrails?: A Little Help needed moving to and from a bed to a chair (including a wheelchair)?: A Little Help needed standing up from a chair using your arms (e.g., wheelchair or bedside chair)?: A Little Help needed to walk in hospital room?: A Little Help needed climbing 3-5 steps with a railing? : A Little 6 Click Score: 18    End of Session Equipment Utilized During Treatment: Gait belt Activity Tolerance: Patient tolerated treatment well Patient left: in chair;with call bell/phone within reach   PT Visit Diagnosis: Muscle weakness (generalized) (M62.81)    Time: 2025-4270 PT Time Calculation (min) (ACUTE ONLY): 31 min   Charges:   PT Evaluation $PT Eval Moderate Complexity: 1 Mod PT Treatments $Gait Training: 8-22 mins           Doreatha Massed, PT Acute Rehabilitation  Office: [phone] Pager: [phone]

## 2020-09-22 NOTE — Plan of Care (Signed)
Patient's pain is gradually improving.

## 2020-09-22 NOTE — Progress Notes (Signed)
PROGRESS NOTE    Edward Wells  [id number]:[id number] DOB: December 11, 1945 DOA: 09/17/2020 PCP: Elizabeth Palau, MD (Inactive)    Brief Narrative:  74yo who presented with septic shock from pyelonephritis, later undergoing cystoscopy and resultant R ureteral stent placement  Assessment & Plan:   Active Problems:   Pyelonephritis  1. Septic shock from ecoli bacteremia and pyelonephritis present on admit 1. Sepsis physiology resolved 2. Blood and urine cx pos for ecoli 3. Currently on rocephin 4. Urology following and is now s/p stent placement 5. Per Urology, recommendation for 14 days of abx total 6. Recheck bmet and cbc in AM 2. Thrombocytopenia 1. Now improving, currently 63 2. Likely secondary to presenting septic shock 3. Hypernatremia 1. Continued on IVF 2. Sodium improving to 153 3. Continue with IV albumin given low albumin 4. Repeat cmp in AM 4. Afib with RVR 1. Transient and resolved 2. Likely secondary to sepsis 5. New diagnosis of DM 1. Continue on SSI coverage as needed  DVT prophylaxis: SCD's Code Status: Full Family Communication: Pt in room, family at bedside  Status is: Inpatient  Remains inpatient appropriate because:IV treatments appropriate due to intensity of illness or inability to take PO and Inpatient level of care appropriate due to severity of illness   Dispo: The patient is from: Home              Anticipated d/c is to: Home              Anticipated d/c date is: 2 days              Patient currently is not medically stable to d/c.   Consultants:   Urology  PCCM  Procedures:   Cystoscopy with stent placement  Antimicrobials: Anti-infectives (From admission, onward)   Start     Dose/Rate Route Frequency Ordered Stop   09/18/20 2200  ceFEPIme (MAXIPIME) 2 g in sodium chloride 0.9 % 100 mL IVPB  Status:  Discontinued        2 g 200 mL/hr over 30 Minutes Intravenous Every 24 hours 09/18/20 1108 09/18/20 1827   09/18/20 2200  cefTRIAXone  (ROCEPHIN) 2 g in sodium chloride 0.9 % 100 mL IVPB        2 g 200 mL/hr over 30 Minutes Intravenous Every 24 hours 09/18/20 1827     09/18/20 0000  vancomycin (VANCOREADY) IVPB 2000 mg/400 mL        2,000 mg 200 mL/hr over 120 Minutes Intravenous  Once 09/17/20 2356 09/18/20 0216   09/18/20 0000  ceFEPIme (MAXIPIME) 2 g in sodium chloride 0.9 % 100 mL IVPB        2 g 200 mL/hr over 30 Minutes Intravenous  Once 09/17/20 2356 09/18/20 0107      Subjective: Reports swelling has improved somewhat  Objective: Vitals:   09/21/20 2214 09/22/20 0607 09/22/20 1049 09/22/20 1325  BP: (!) 116/54 121/66 120/68 (!) 147/71  Pulse: 85 91 88 80  Resp: 18 18 18 20   Temp: 98 F (36.7 C) 98 F (36.7 C) 98.6 F (37 C) 98.9 F (37.2 C)  TempSrc: Oral  Oral Oral  SpO2: 100% 99% 98% 100%  Weight:      Height:        Intake/Output Summary (Last 24 hours) at 09/22/2020 1537 Last data filed at 09/22/2020 1334 Gross per 24 hour  Intake 180 ml  Output 2200 ml  Net -2020 ml   Filed Weights   09/18/20 1103 09/18/20 1400  Weight: 103.9 kg 94.8 kg    Examination: General exam: Conversant, in no acute distress Respiratory system: normal chest rise, clear, no audible wheezing Cardiovascular system: regular rhythm, s1-s2 Gastrointestinal system: Nondistended, nontender, pos BS Central nervous system: No seizures, no tremors Extremities: No cyanosis, no joint deformities Skin: No rashes, no pallor Psychiatry: Affect normal // no auditory hallucinations   Data Reviewed: I have personally reviewed following labs and imaging studies  CBC: Recent Labs  Lab 09/17/20 2314 09/18/20 0929 09/19/20 0239 09/20/20 0235 09/21/20 0543 09/22/20 0538  WBC 47.3*  --  21.9* 12.9* 8.0 7.1  NEUTROABS 43.9*  --  19.6* 10.9* 6.0 5.3  HGB 13.8  --  12.6* 12.4* 10.7* 9.6*  HCT 41.6  --  39.2 39.4 34.0* 31.7*  MCV 81.3  --  83.6 84.7 85.4 87.6  PLT 50* 39* 34* 34* 41* 63*   Basic Metabolic Panel: Recent  Labs  Lab 09/17/20 2314 09/18/20 0929 09/19/20 0239 09/20/20 0235 09/21/20 0543 09/22/20 0538  NA 146* 150* 152* 158* 155* 153*  K 3.9 3.8 3.9 4.1 4.2 4.0  CL 114* 117* 119* 124* 119* 117*  CO2 18* 20* 23 24 25 26   GLUCOSE 308* 221* 186* 133* 237* 203*  BUN 211* 193* 151* 102* 63* 33*  CREATININE 3.61* 3.03* 2.47* 1.89* 1.39* 1.18  CALCIUM 7.7* 7.8* 8.3* 8.9 8.5* 8.6*  MG 3.3*  --   --   --   --  1.7   GFR: Estimated Creatinine Clearance: 67.6 mL/min (by C-G formula based on SCr of 1.18 mg/dL). Liver Function Tests: Recent Labs  Lab 09/17/20 2314 09/18/20 0929 09/19/20 0239 09/22/20 0538  AST 13* 11* 12* 23  ALT 20 18 18  32  ALKPHOS 101 344* 93 55  BILITOT 0.8 1.0 0.5 0.8  PROT 6.0* 4.9* 5.3* 5.3*  ALBUMIN 2.4* 1.9* 2.0* 2.3*   Recent Labs  Lab 09/17/20 2314  LIPASE 35   No results for input(s): AMMONIA in the last 168 hours. Coagulation Profile: Recent Labs  Lab 09/18/20 0929  INR 1.5*   Cardiac Enzymes: No results for input(s): CKTOTAL, CKMB, CKMBINDEX, TROPONINI in the last 168 hours. BNP (last 3 results) No results for input(s): PROBNP in the last 8760 hours. HbA1C: No results for input(s): HGBA1C in the last 72 hours. CBG: Recent Labs  Lab 09/21/20 1134 09/21/20 1626 09/21/20 2210 09/22/20 0755 09/22/20 1123  GLUCAP 196* 131* 159* 175* 132*   Lipid Profile: No results for input(s): CHOL, HDL, LDLCALC, TRIG, CHOLHDL, LDLDIRECT in the last 72 hours. Thyroid Function Tests: No results for input(s): TSH, T4TOTAL, FREET4, T3FREE, THYROIDAB in the last 72 hours. Anemia Panel: No results for input(s): VITAMINB12, FOLATE, FERRITIN, TIBC, IRON, RETICCTPCT in the last 72 hours. Sepsis Labs: Recent Labs  Lab 09/17/20 0320 09/17/20 2314 09/18/20 0929  LATICACIDVEN 2.0* 3.1* 1.8    Recent Results (from the past 240 hour(s))  Blood Culture (routine x 2)     Status: Abnormal   Collection Time: 09/17/20 11:14 PM   Specimen: BLOOD  Result Value Ref  Range Status   Specimen Description   Final    BLOOD RIGHT ANTECUBITAL Performed at [facility] [address] [place], [place] [address]    Special Requests   Final    BOTTLES DRAWN AEROBIC AND ANAEROBIC Blood Culture adequate volume Performed at [facility] [address] [place], [place] [address]    Culture  Setup Time   Final  GRAM NEGATIVE RODS IN BOTH AEROBIC AND ANAEROBIC BOTTLES Organism ID to follow CRITICAL RESULT CALLED TO, READ BACK BY AND VERIFIED WITHKeturah Barre [facility] PHARMD 1619 09/17/20 A BROWNING Performed at [facility], [place] [address] [place], [place] [address]    Culture ESCHERICHIA COLI (A)  Final   Report Status 09/20/2020 FINAL  Final   Organism ID, Bacteria ESCHERICHIA COLI  Final      Susceptibility   Escherichia coli - MIC*    AMPICILLIN 4 SENSITIVE Sensitive     CEFAZOLIN <=4 SENSITIVE Sensitive     CEFEPIME <=0.12 SENSITIVE Sensitive     CEFTAZIDIME <=1 SENSITIVE Sensitive     CEFTRIAXONE <=0.25 SENSITIVE Sensitive     CIPROFLOXACIN <=0.25 SENSITIVE Sensitive     GENTAMICIN <=1 SENSITIVE Sensitive     IMIPENEM <=0.25 SENSITIVE Sensitive     TRIMETH/SULFA <=20 SENSITIVE Sensitive     AMPICILLIN/SULBACTAM <=2 SENSITIVE Sensitive     PIP/TAZO <=4 SENSITIVE Sensitive     * ESCHERICHIA COLI  Blood Culture (routine x 2)     Status: Abnormal   Collection Time: 09/17/20 11:14 PM   Specimen: BLOOD  Result Value Ref Range Status   Specimen Description   Final    BLOOD LEFT ANTECUBITAL Performed at [facility] [address], [place], [place] [address]    Special Requests   Final    BOTTLES DRAWN AEROBIC AND ANAEROBIC Blood Culture adequate volume Performed at [facility] [address] [place], [place] [address]    Culture  Setup Time   Final    GRAM NEGATIVE RODS IN BOTH AEROBIC AND ANAEROBIC BOTTLES CRITICAL VALUE NOTED.  VALUE IS CONSISTENT WITH PREVIOUSLY  REPORTED AND CALLED VALUE.    Culture (A)  Final    ESCHERICHIA COLI SUSCEPTIBILITIES PERFORMED ON PREVIOUS CULTURE WITHIN THE LAST 5 DAYS. Performed at [facility], [place] [address], [place], [place] [address]    Report Status 09/20/2020 FINAL  Final  Blood Culture ID Panel (Reflexed)     Status: Abnormal   Collection Time: 09/17/20 11:14 PM  Result Value Ref Range Status   Enterococcus faecalis NOT DETECTED NOT DETECTED Final   Enterococcus Faecium NOT DETECTED NOT DETECTED Final   Listeria monocytogenes NOT DETECTED NOT DETECTED Final   Staphylococcus species NOT DETECTED NOT DETECTED Final   Staphylococcus aureus (BCID) NOT DETECTED NOT DETECTED Final   Staphylococcus epidermidis NOT DETECTED NOT DETECTED Final   Staphylococcus lugdunensis NOT DETECTED NOT DETECTED Final   Streptococcus species NOT DETECTED NOT DETECTED Final   Streptococcus agalactiae NOT DETECTED NOT DETECTED Final   Streptococcus pneumoniae NOT DETECTED NOT DETECTED Final   Streptococcus pyogenes NOT DETECTED NOT DETECTED Final   A.calcoaceticus-baumannii NOT DETECTED NOT DETECTED Final   Bacteroides fragilis NOT DETECTED NOT DETECTED Final   Enterobacterales DETECTED (A) NOT DETECTED Final    Comment: Enterobacterales represent a large order of gram negative bacteria, not a single organism. CRITICAL RESULT CALLED TO, READ BACK BY AND VERIFIED WITH: D [facility] PHARMD 1619 09/17/20 A BROWNING    Enterobacter cloacae complex NOT DETECTED NOT DETECTED Final   Escherichia coli DETECTED (A) NOT DETECTED Final    Comment: CRITICAL RESULT CALLED TO, READ BACK BY AND VERIFIED WITH: D [facility] PHARMD 1619 09/17/20 A BROWNING    Klebsiella aerogenes NOT DETECTED NOT DETECTED Final   Klebsiella oxytoca NOT DETECTED NOT DETECTED Final   Klebsiella pneumoniae NOT DETECTED NOT DETECTED Final   Proteus species  NOT DETECTED NOT DETECTED Final   Salmonella species NOT DETECTED NOT DETECTED Final   Serratia marcescens  NOT DETECTED NOT DETECTED Final   Haemophilus influenzae NOT DETECTED NOT DETECTED Final   Neisseria meningitidis NOT DETECTED NOT DETECTED Final   Pseudomonas aeruginosa NOT DETECTED NOT DETECTED Final   Stenotrophomonas maltophilia NOT DETECTED NOT DETECTED Final   Candida albicans NOT DETECTED NOT DETECTED Final   Candida auris NOT DETECTED NOT DETECTED Final   Candida glabrata NOT DETECTED NOT DETECTED Final   Candida krusei NOT DETECTED NOT DETECTED Final   Candida parapsilosis NOT DETECTED NOT DETECTED Final   Candida tropicalis NOT DETECTED NOT DETECTED Final   Cryptococcus neoformans/gattii NOT DETECTED NOT DETECTED Final   CTX-M ESBL NOT DETECTED NOT DETECTED Final   Carbapenem resistance IMP NOT DETECTED NOT DETECTED Final   Carbapenem resistance KPC NOT DETECTED NOT DETECTED Final   Carbapenem resistance NDM NOT DETECTED NOT DETECTED Final   Carbapenem resist OXA 48 LIKE NOT DETECTED NOT DETECTED Final   Carbapenem resistance VIM NOT DETECTED NOT DETECTED Final    Comment: Performed at [facility], [address] [place], [place] [address]  Resp Panel by RT-PCR (Flu A&B, Covid) Nasopharyngeal Swab     Status: None   Collection Time: 09/17/20 11:37 PM   Specimen: Nasopharyngeal Swab; Nasopharyngeal(NP) swabs in vial transport medium  Result Value Ref Range Status   SARS Coronavirus 2 by RT PCR NEGATIVE NEGATIVE Final    Comment: (NOTE) SARS-CoV-2 target nucleic acids are NOT DETECTED.  The SARS-CoV-2 RNA is generally detectable in upper respiratory specimens during the acute phase of infection. The lowest concentration of SARS-CoV-2 viral copies this assay can detect is 138 copies/mL. A negative result does not preclude SARS-Cov-2 infection and should not be used as the sole basis for treatment or other patient management decisions. A negative result may occur with  improper specimen collection/handling, submission of specimen other than nasopharyngeal swab,  presence of viral mutation(s) within the areas targeted by this assay, and inadequate number of viral copies(<138 copies/mL). A negative result must be combined with clinical observations, patient history, and epidemiological information. The expected result is Negative.  Fact Sheet for Patients:  EntrepreneurPulse.com.au  Fact Sheet for Healthcare Providers:  IncredibleEmployment.be  This test is no t yet approved or cleared by the [place] FDA and  has been authorized for detection and/or diagnosis of SARS-CoV-2 by FDA under an Emergency Use Authorization (EUA). This EUA will remain  in effect (meaning this test can be used) for the duration of the COVID-19 declaration under Section 564(b)(1) of the Act, 21 U.S.C.section 360bbb-3(b)(1), unless the authorization is terminated  or revoked sooner.       Influenza A by PCR NEGATIVE NEGATIVE Final   Influenza B by PCR NEGATIVE NEGATIVE Final    Comment: (NOTE) The Xpert Xpress SARS-CoV-2/FLU/RSV plus assay is intended as an aid in the diagnosis of influenza from Nasopharyngeal swab specimens and should not be used as a sole basis for treatment. Nasal washings and aspirates are unacceptable for Xpert Xpress SARS-CoV-2/FLU/RSV testing.  Fact Sheet for Patients: EntrepreneurPulse.com.au  Fact Sheet for Healthcare Providers: IncredibleEmployment.be  This test is not yet approved or cleared by the [place] FDA and has been authorized for detection and/or diagnosis of SARS-CoV-2 by FDA under an Emergency Use Authorization (EUA). This EUA will remain in effect (meaning this test can be used) for the duration of the COVID-19 declaration under Section 564(b)(1) of the Act,  21 U.S.C. section 360bbb-3(b)(1), unless the authorization is terminated or revoked.  Performed at [facility], [place] [address], [place], [place] [address]   Urine  culture     Status: Abnormal   Collection Time: 09/18/20 12:40 AM   Specimen: In/Out Cath Urine  Result Value Ref Range Status   Specimen Description   Final    IN/OUT CATH URINE Performed at [facility] [address] [place], [place] [address]    Special Requests   Final    NONE Performed at [facility], [place] [address], [place], [place] [address]    Culture >=100,000 COLONIES/mL ESCHERICHIA COLI (A)  Final   Report Status 09/20/2020 FINAL  Final   Organism ID, Bacteria ESCHERICHIA COLI (A)  Final      Susceptibility   Escherichia coli - MIC*    AMPICILLIN 4 SENSITIVE Sensitive     CEFAZOLIN <=4 SENSITIVE Sensitive     CEFEPIME <=0.12 SENSITIVE Sensitive     CEFTRIAXONE <=0.25 SENSITIVE Sensitive     CIPROFLOXACIN <=0.25 SENSITIVE Sensitive     GENTAMICIN <=1 SENSITIVE Sensitive     IMIPENEM <=0.25 SENSITIVE Sensitive     NITROFURANTOIN <=16 SENSITIVE Sensitive     TRIMETH/SULFA <=20 SENSITIVE Sensitive     AMPICILLIN/SULBACTAM <=2 SENSITIVE Sensitive     PIP/TAZO <=4 SENSITIVE Sensitive     * >=100,000 COLONIES/mL ESCHERICHIA COLI  MRSA PCR Screening     Status: None   Collection Time: 09/18/20  1:40 PM   Specimen: Nasopharyngeal  Result Value Ref Range Status   MRSA by PCR NEGATIVE NEGATIVE Final    Comment:        The GeneXpert MRSA Assay (FDA approved for NASAL specimens only), is one component of a comprehensive MRSA colonization surveillance program. It is not intended to diagnose MRSA infection nor to guide or monitor treatment for MRSA infections. Performed at [facility], [place] [address], [place], [place] [address]      Radiology Studies: No results found.  Scheduled Meds: . insulin aspart  0-15 Units Subcutaneous TID WC  . insulin aspart  0-5 Units Subcutaneous QHS   Continuous Infusions: . sodium chloride    . cefTRIAXone (ROCEPHIN)  IV 2 g (09/21/20 2206)  . dextrose 100 mL/hr at  09/22/20 1038     LOS: 4 days   Marylu Lund, MD [facility] Pager On Amion  If 7PM-7AM, please contact night-coverage 09/22/2020, 3:37 PM

## 2020-09-23 ENCOUNTER — Inpatient Hospital Stay (HOSPITAL_COMMUNITY): Payer: Medicare Other

## 2020-09-23 DIAGNOSIS — M7989 Other specified soft tissue disorders: Secondary | ICD-10-CM

## 2020-09-23 LAB — COMPREHENSIVE METABOLIC PANEL
ALT: 39 U/L (ref 0–44)
ALT: 36 U/L (ref 0–44)
AST: 31 U/L (ref 15–41)
AST: 27 U/L (ref 15–41)
Albumin: 2.6 g/dL — ABNORMAL LOW (ref 3.5–5.0)
Albumin: 2.5 g/dL — ABNORMAL LOW (ref 3.5–5.0)
Alkaline Phosphatase: 57 U/L (ref 38–126)
Alkaline Phosphatase: 55 U/L (ref 38–126)
Anion gap: 9 (ref 5–15)
Anion gap: 9 (ref 5–15)
BUN: 22 mg/dL (ref 8–23)
BUN: 18 mg/dL (ref 8–23)
CO2: 27 mmol/L (ref 22–32)
CO2: 24 mmol/L (ref 22–32)
Calcium: 8.9 mg/dL (ref 8.9–10.3)
Calcium: 8.3 mg/dL — ABNORMAL LOW (ref 8.9–10.3)
Chloride: 119 mmol/L — ABNORMAL HIGH (ref 98–111)
Chloride: 114 mmol/L — ABNORMAL HIGH (ref 98–111)
Creatinine, Ser: 0.99 mg/dL (ref 0.61–1.24)
Creatinine, Ser: 1.14 mg/dL (ref 0.61–1.24)
GFR, Estimated: >60 mL/min (ref >60)
GFR, Estimated: >60 mL/min (ref >60)
Glucose, Bld: 184 mg/dL — ABNORMAL HIGH (ref 70–99)
Glucose, Bld: 207 mg/dL — ABNORMAL HIGH (ref 70–99)
Potassium: 3.9 mmol/L (ref 3.5–5.1)
Potassium: 3.5 mmol/L (ref 3.5–5.1)
Sodium: 155 mmol/L — ABNORMAL HIGH (ref 135–145)
Sodium: 147 mmol/L — ABNORMAL HIGH (ref 135–145)
Total Bilirubin: 0.7 mg/dL (ref 0.3–1.2)
Total Bilirubin: 0.7 mg/dL (ref 0.3–1.2)
Total Protein: 6.1 g/dL — ABNORMAL LOW (ref 6.5–8.1)
Total Protein: 5.4 g/dL — ABNORMAL LOW (ref 6.5–8.1)

## 2020-09-23 LAB — GLUCOSE, CAPILLARY
Glucose-Capillary: 180 mg/dL — ABNORMAL HIGH (ref 70–99)
Glucose-Capillary: 154 mg/dL — ABNORMAL HIGH (ref 70–99)
Glucose-Capillary: 141 mg/dL — ABNORMAL HIGH (ref 70–99)
Glucose-Capillary: 150 mg/dL — ABNORMAL HIGH (ref 70–99)
Glucose-Capillary: 167 mg/dL — ABNORMAL HIGH (ref 70–99)

## 2020-09-23 LAB — CBC WITH DIFFERENTIAL/PLATELET
Abs Immature Granulocytes: 0.04 10*3/uL (ref 0.00–0.07)
Basophils Absolute: 0.1 10*3/uL (ref 0.0–0.1)
Basophils Relative: 1 %
Eosinophils Absolute: 0.1 10*3/uL (ref 0.0–0.5)
Eosinophils Relative: 1 %
HCT: 33.6 % — ABNORMAL LOW (ref 39.0–52.0)
Hemoglobin: 10.1 g/dL — ABNORMAL LOW (ref 13.0–17.0)
Immature Granulocytes: 0 %
Lymphocytes Relative: 13 %
Lymphs Abs: 1.2 10*3/uL (ref 0.7–4.0)
MCH: 27 pg (ref 26.0–34.0)
MCHC: 30.1 g/dL (ref 30.0–36.0)
MCV: 89.8 fL (ref 80.0–100.0)
Monocytes Absolute: 0.5 10*3/uL (ref 0.1–1.0)
Monocytes Relative: 5 %
Neutro Abs: 7.4 10*3/uL (ref 1.7–7.7)
Neutrophils Relative %: 80 %
Platelets: 96 10*3/uL — ABNORMAL LOW (ref 150–400)
RBC: 3.74 MIL/uL — ABNORMAL LOW (ref 4.22–5.81)
RDW: 14.7 % (ref 11.5–15.5)
WBC: 9.4 10*3/uL (ref 4.0–10.5)
nRBC: 0 % (ref 0.0–0.2)

## 2020-09-23 LAB — BLOOD GAS, ARTERIAL
Acid-Base Excess: 0.1 mmol/L (ref 0.0–2.0)
Bicarbonate: 22.4 mmol/L (ref 20.0–28.0)
Drawn by: 422461
FIO2: 28
O2 Content: 2 L/min
O2 Saturation: 97.7 %
Patient temperature: 98.6
pCO2 arterial: 28.5 mmHg — ABNORMAL LOW (ref 32.0–48.0)
pH, Arterial: 7.507 — ABNORMAL HIGH (ref 7.350–7.450)
pO2, Arterial: 92.6 mmHg (ref 83.0–108.0)

## 2020-09-23 LAB — CBC
HCT: 30.2 % — ABNORMAL LOW (ref 39.0–52.0)
Hemoglobin: 9.2 g/dL — ABNORMAL LOW (ref 13.0–17.0)
MCH: 27.1 pg (ref 26.0–34.0)
MCHC: 30.5 g/dL (ref 30.0–36.0)
MCV: 88.8 fL (ref 80.0–100.0)
Platelets: 108 10*3/uL — ABNORMAL LOW (ref 150–400)
RBC: 3.4 MIL/uL — ABNORMAL LOW (ref 4.22–5.81)
RDW: 14.8 % (ref 11.5–15.5)
WBC: 12 10*3/uL — ABNORMAL HIGH (ref 4.0–10.5)
nRBC: 0 % (ref 0.0–0.2)

## 2020-09-23 LAB — C-REACTIVE PROTEIN: CRP: 4.8 mg/dL — ABNORMAL HIGH (ref <1.0)

## 2020-09-23 LAB — LACTIC ACID, PLASMA: Lactic Acid, Venous: 1.3 mmol/L (ref 0.5–1.9)

## 2020-09-23 MED ORDER — ALBUMIN HUMAN 5 % IV SOLN
12.5000 g | Freq: Once | INTRAVENOUS | Status: DC
  Filled 2020-09-23: qty 250

## 2020-09-23 MED ORDER — HEPARIN (PORCINE) 25000 UT/250ML-% IV SOLN
1650.0000 [IU]/h | INTRAVENOUS | Status: DC
  Administered 2020-09-23 (×2): 1500 [IU]/h via INTRAVENOUS
  Administered 2020-09-24 – 2020-09-25 (×2): 1650 [IU]/h via INTRAVENOUS
  Filled 2020-09-23 (×3): qty 250

## 2020-09-23 MED ORDER — PHENYLEPHRINE HCL-NACL 10-0.9 MG/250ML-% IV SOLN: 0.0000 ug/min | INTRAVENOUS | Status: DC

## 2020-09-23 MED ORDER — NOREPINEPHRINE 4 MG/250ML-% IV SOLN
0.0000 ug/min | INTRAVENOUS | Status: DC
Start: 2020-09-23 — End: 2020-09-23

## 2020-09-23 MED ORDER — SODIUM CHLORIDE 0.9 % IV SOLN
250.0000 mL | INTRAVENOUS | Status: DC
  Administered 2020-09-25: 22:00:00 250 mL via INTRAVENOUS

## 2020-09-23 MED ORDER — CHLORHEXIDINE GLUCONATE CLOTH 2 % EX PADS
6.0000 | MEDICATED_PAD | Freq: Every day | CUTANEOUS | Status: DC
  Administered 2020-09-23 – 2020-09-30 (×7): 6 via TOPICAL

## 2020-09-23 MED ORDER — PHENYLEPHRINE HCL-NACL 10-0.9 MG/250ML-% IV SOLN
25.0000 ug/min | INTRAVENOUS | Status: DC
  Administered 2020-09-23: 20:00:00 25 ug/min via INTRAVENOUS
  Administered 2020-09-24: 23:00:00 55 ug/min via INTRAVENOUS
  Administered 2020-09-24: 15:00:00 75 ug/min via INTRAVENOUS
  Administered 2020-09-24: 01:00:00 45 ug/min via INTRAVENOUS
  Administered 2020-09-24: 18:00:00 75 ug/min via INTRAVENOUS
  Administered 2020-09-24: 20:00:00 55 ug/min via INTRAVENOUS
  Administered 2020-09-24: 09:00:00 65 ug/min via INTRAVENOUS
  Administered 2020-09-25: 09:00:00 38 ug/min via INTRAVENOUS
  Administered 2020-09-25: 04:00:00 35 ug/min via INTRAVENOUS
  Administered 2020-09-26: 15 ug/min via INTRAVENOUS
  Filled 2020-09-23 (×12): qty 250

## 2020-09-23 MED ORDER — ALBUMIN HUMAN 25 % IV SOLN
12.5000 g | Freq: Once | INTRAVENOUS | Status: AC
  Administered 2020-09-23: 20:00:00 12.5 g via INTRAVENOUS

## 2020-09-23 MED ORDER — HEPARIN BOLUS VIA INFUSION
3000.0000 [IU] | Freq: Once | INTRAVENOUS | Status: AC
  Administered 2020-09-23: 17:00:00 3000 [IU] via INTRAVENOUS
  Filled 2020-09-23: qty 3000

## 2020-09-23 NOTE — Progress Notes (Signed)
Pt's family called out from room stating that her father wasn't responding well and eyes looked like they were rolling back in his head.  CN entered room and pt was staring off into space.  Very delayed response.  Pt would look at nurse but then would stare off again.  Rapid response nurse called.   Unable to get BP in arm both by dinamap and manual.  CBG 150.  Pt placed in slight trendelenburg but unable to tolerate.  IVF bolus started.  BP obtained in leg 88/41.  MD called and pt ordered to tx to ICU.  Pt's RN and CN took pt to ICU with rapid response nurse.  Report given to night nurse in ICU. Stacey Drain

## 2020-09-23 NOTE — Patient Care Conference (Signed)
Attempted to call pt's niece at number listed. No answer. Will try again later

## 2020-09-23 NOTE — Significant Event (Signed)
Rapid Response Event Note   Reason for Call :  Called to bedside dt decreased pt responsiveness  Initial Focused Assessment:  Neuro: Lethargic, but able to answer orientation questions  Resp: RR 22/min. 2L placed on patient. 96% on 2L Nelson Cards: Hr 96-107, sinus. Unable to take BP on arm, manual and doppler BP attempted. BP checked on left leg. 78/36 (47)   Interventions:  500cc fluid bolus started per RRT protocol. 2L O2 applied. CBG checked- 150 EKG completed- Sinus Tach Tx to ICU unit NP Sharlet Salina paged to bedside   Plan of Care:  Transfer to ICU. NP Sharlet Salina at bedside. Albumin ordered and given. CCM made aware. Pressors started per order   Event Summary:   MD Notified: NP Sharlet Salina Call Time: 1858 Arrival Time: 1901 End Time: 2015  Eliane Decree, RN

## 2020-09-23 NOTE — Care Management Important Message (Signed)
Important Message  Patient Details IM Letter given to the Patient. Name: Edward Wells MRN: [id number] Date of Birth: November 08, 1945   Medicare Important Message Given:  Yes     [name] 09/23/2020, 2:28 PM

## 2020-09-23 NOTE — Progress Notes (Signed)
PROGRESS NOTE    Edward Wells  RJJ:[id number] DOB: 04-10-1946 DOA: 09/17/2020 PCP: Elizabeth Palau, MD (Inactive)    Brief Narrative:  74yo who presented with septic shock from pyelonephritis, later undergoing cystoscopy and resultant R ureteral stent placement  Assessment & Plan:   Active Problems:   Pyelonephritis  1. Septic shock from ecoli bacteremia and pyelonephritis present on admit 1. Sepsis physiology resolved 2. Blood and urine cx pos for ecoli 3. Currently on rocephin 4. Urology had been following and is now s/p stent placement 5. Per Urology, recommendation for 14 days of abx total 6. Cont to follow cmp and cbc 2. Thrombocytopenia 1. Now improving, currently 63 2. Likely secondary to presenting septic shock 3. Hypernatremia 1. Continued on D5w IVF 2. Sodium improving to 153 as of 12/19. Will repeat level today 3. Continue with IV albumin given low albumin 4. Repeat cmp in AM 4. Afib with RVR 1. Transient and resolved 2. Likely secondary to sepsis 5. New diagnosis of DM 1. Continue with SSI coverage as needed 2. Consider metformin on d/c 6. RLE edema 1. Unilateral edema on exam. No calf tenderness 2. Will check LE dopplers to r/o DVT  DVT prophylaxis: SCD's Code Status: Full Family Communication: Pt in room, family at bedside  Status is: Inpatient  Remains inpatient appropriate because:IV treatments appropriate due to intensity of illness or inability to take PO and Inpatient level of care appropriate due to severity of illness   Dispo: The patient is from: Home              Anticipated d/c is to: Home              Anticipated d/c date is: 2 days              Patient currently is not medically stable to d/c.   Consultants:   Urology  PCCM  Procedures:   Cystoscopy with stent placement  Antimicrobials: Anti-infectives (From admission, onward)   Start     Dose/Rate Route Frequency Ordered Stop   09/18/20 2200  ceFEPIme (MAXIPIME) 2 g  in sodium chloride 0.9 % 100 mL IVPB  Status:  Discontinued        2 g 200 mL/hr over 30 Minutes Intravenous Every 24 hours 09/18/20 1108 09/18/20 1827   09/18/20 2200  cefTRIAXone (ROCEPHIN) 2 g in sodium chloride 0.9 % 100 mL IVPB        2 g 200 mL/hr over 30 Minutes Intravenous Every 24 hours 09/18/20 1827     09/18/20 0000  vancomycin (VANCOREADY) IVPB 2000 mg/400 mL        2,000 mg 200 mL/hr over 120 Minutes Intravenous  Once 09/17/20 2356 09/18/20 0216   09/18/20 0000  ceFEPIme (MAXIPIME) 2 g in sodium chloride 0.9 % 100 mL IVPB        2 g 200 mL/hr over 30 Minutes Intravenous  Once 09/17/20 2356 09/18/20 0107      Subjective: Reports swelling has improved  Objective: Vitals:   09/22/20 1325 09/22/20 2120 09/23/20 0451 09/23/20 1237  BP: (!) 147/71 (!) 112/59 125/62 (!) 119/55  Pulse: 80 91 71 94  Resp: 20 16 18 18   Temp: 98.9 F (37.2 C) 98.2 F (36.8 C) 98.3 F (36.8 C) 99.2 F (37.3 C)  TempSrc: Oral Oral Oral Oral  SpO2: 100% 98% 98% 100%  Weight:   96.1 kg   Height:        Intake/Output Summary (Last 24 hours) at  09/23/2020 1442 Last data filed at 09/23/2020 0454 [place] per 24 hour  Intake 105.69 ml  Output 1100 ml  Net -994.31 ml   Filed Weights   09/18/20 1103 09/18/20 1400 09/23/20 0451  Weight: 103.9 kg 94.8 kg 96.1 kg    Examination: General exam: Awake, laying in bed, in nad Respiratory system: Normal respiratory effort, no wheezing Cardiovascular system: regular rate, s1, s2 Gastrointestinal system: Soft, nondistended, positive BS Central nervous system: CN2-12 grossly intact, strength intact Extremities: Perfused, no clubbing, LE edema R>L Skin: Normal skin turgor, no notable skin lesions seen Psychiatry: Mood normal // no visual hallucinations    Data Reviewed: I have personally reviewed following labs and imaging studies  CBC: Recent Labs  Lab 09/19/20 0239 09/20/20 0235 09/21/20 0543 09/22/20 0538 09/23/20 0509  WBC 21.9* 12.9*  8.0 7.1 9.4  NEUTROABS 19.6* 10.9* 6.0 5.3 7.4  HGB 12.6* 12.4* 10.7* 9.6* 10.1*  HCT 39.2 39.4 34.0* 31.7* 33.6*  MCV 83.6 84.7 85.4 87.6 89.8  PLT 34* 34* 41* 63* 96*   Basic Metabolic Panel: Recent Labs  Lab 09/17/20 2314 09/18/20 0929 09/19/20 0239 09/20/20 0235 09/21/20 0543 09/22/20 0538  NA 146* 150* 152* 158* 155* 153*  K 3.9 3.8 3.9 4.1 4.2 4.0  CL 114* 117* 119* 124* 119* 117*  CO2 18* 20* 23 24 25 26   GLUCOSE 308* 221* 186* 133* 237* 203*  BUN 211* 193* 151* 102* 63* 33*  CREATININE 3.61* 3.03* 2.47* 1.89* 1.39* 1.18  CALCIUM 7.7* 7.8* 8.3* 8.9 8.5* 8.6*  MG 3.3*  --   --   --   --  1.7   GFR: Estimated Creatinine Clearance: 68 mL/min (by C-G formula based on SCr of 1.18 mg/dL). Liver Function Tests: Recent Labs  Lab 09/17/20 2314 09/18/20 0929 09/19/20 0239 09/22/20 0538  AST 13* 11* 12* 23  ALT 20 18 18  32  ALKPHOS 101 344* 93 55  BILITOT 0.8 1.0 0.5 0.8  PROT 6.0* 4.9* 5.3* 5.3*  ALBUMIN 2.4* 1.9* 2.0* 2.3*   Recent Labs  Lab 09/17/20 2314  LIPASE 35   No results for input(s): AMMONIA in the last 168 hours. Coagulation Profile: Recent Labs  Lab 09/18/20 0929  INR 1.5*   Cardiac Enzymes: No results for input(s): CKTOTAL, CKMB, CKMBINDEX, TROPONINI in the last 168 hours. BNP (last 3 results) No results for input(s): PROBNP in the last 8760 hours. HbA1C: No results for input(s): HGBA1C in the last 72 hours. CBG: Recent Labs  Lab 09/22/20 1123 09/22/20 1635 09/22/20 2117 09/23/20 0731 09/23/20 1137  GLUCAP 132* 158* 150* 180* 154*   Lipid Profile: No results for input(s): CHOL, HDL, LDLCALC, TRIG, CHOLHDL, LDLDIRECT in the last 72 hours. Thyroid Function Tests: No results for input(s): TSH, T4TOTAL, FREET4, T3FREE, THYROIDAB in the last 72 hours. Anemia Panel: No results for input(s): VITAMINB12, FOLATE, FERRITIN, TIBC, IRON, RETICCTPCT in the last 72 hours. Sepsis Labs: Recent Labs  Lab 09/17/20 0320 09/17/20 2314  09/18/20 0929  LATICACIDVEN 2.0* 3.1* 1.8    Recent Results (from the past 240 hour(s))  Blood Culture (routine x 2)     Status: Abnormal   Collection Time: 09/17/20 11:14 PM   Specimen: BLOOD  Result Value Ref Range Status   Specimen Description   Final    BLOOD RIGHT ANTECUBITAL Performed at [facility] [address] [place], [place] [address]    Special Requests   Final    BOTTLES DRAWN AEROBIC AND ANAEROBIC Blood Culture  adequate volume Performed at [facility] [address], [place], [place] [address]    Culture  Setup Time   Final    GRAM NEGATIVE RODS IN BOTH AEROBIC AND ANAEROBIC BOTTLES Organism ID to follow CRITICAL RESULT CALLED TO, READ BACK BY AND VERIFIED WITHKeturah Barre [facility] PHARMD 1619 09/17/20 A BROWNING Performed at [facility], [place] [address] [place], [place] [address]    Culture ESCHERICHIA COLI (A)  Final   Report Status 09/20/2020 FINAL  Final   Organism ID, Bacteria ESCHERICHIA COLI  Final      Susceptibility   Escherichia coli - MIC*    AMPICILLIN 4 SENSITIVE Sensitive     CEFAZOLIN <=4 SENSITIVE Sensitive     CEFEPIME <=0.12 SENSITIVE Sensitive     CEFTAZIDIME <=1 SENSITIVE Sensitive     CEFTRIAXONE <=0.25 SENSITIVE Sensitive     CIPROFLOXACIN <=0.25 SENSITIVE Sensitive     GENTAMICIN <=1 SENSITIVE Sensitive     IMIPENEM <=0.25 SENSITIVE Sensitive     TRIMETH/SULFA <=20 SENSITIVE Sensitive     AMPICILLIN/SULBACTAM <=2 SENSITIVE Sensitive     PIP/TAZO <=4 SENSITIVE Sensitive     * ESCHERICHIA COLI  Blood Culture (routine x 2)     Status: Abnormal   Collection Time: 09/17/20 11:14 PM   Specimen: BLOOD  Result Value Ref Range Status   Specimen Description   Final    BLOOD LEFT ANTECUBITAL Performed at [facility] [address] [place], [place] [address]    Special Requests   Final    BOTTLES DRAWN AEROBIC AND ANAEROBIC Blood Culture adequate volume Performed at [facility] [address] [place], [place] [address]    Culture  Setup Time   Final    GRAM NEGATIVE RODS IN BOTH AEROBIC AND ANAEROBIC BOTTLES CRITICAL VALUE NOTED.  VALUE IS CONSISTENT WITH PREVIOUSLY REPORTED AND CALLED VALUE.    Culture (A)  Final    ESCHERICHIA COLI SUSCEPTIBILITIES PERFORMED ON PREVIOUS CULTURE WITHIN THE LAST 5 DAYS. Performed at [facility], [place] [address] [place], [place] [address]    Report Status 09/20/2020 FINAL  Final  Blood Culture ID Panel (Reflexed)     Status: Abnormal   Collection Time: 09/17/20 11:14 PM  Result Value Ref Range Status   Enterococcus faecalis NOT DETECTED NOT DETECTED Final   Enterococcus Faecium NOT DETECTED NOT DETECTED Final   Listeria monocytogenes NOT DETECTED NOT DETECTED Final   Staphylococcus species NOT DETECTED NOT DETECTED Final   Staphylococcus aureus (BCID) NOT DETECTED NOT DETECTED Final   Staphylococcus epidermidis NOT DETECTED NOT DETECTED Final   Staphylococcus lugdunensis NOT DETECTED NOT DETECTED Final   Streptococcus species NOT DETECTED NOT DETECTED Final   Streptococcus agalactiae NOT DETECTED NOT DETECTED Final   Streptococcus pneumoniae NOT DETECTED NOT DETECTED Final   Streptococcus pyogenes NOT DETECTED NOT DETECTED Final   A.calcoaceticus-baumannii NOT DETECTED NOT DETECTED Final   Bacteroides fragilis NOT DETECTED NOT DETECTED Final   Enterobacterales DETECTED (A) NOT DETECTED Final    Comment: Enterobacterales represent a large order of gram negative bacteria, not a single organism. CRITICAL RESULT CALLED TO, READ BACK BY AND VERIFIED WITH: D [facility] PHARMD 1619 09/17/20 A BROWNING    Enterobacter cloacae complex NOT DETECTED NOT DETECTED Final   Escherichia coli DETECTED (A) NOT DETECTED Final    Comment: CRITICAL RESULT CALLED TO, READ BACK BY AND VERIFIED WITH: D [facility] PHARMD 1619 09/17/20 A BROWNING    Klebsiella  aerogenes NOT DETECTED NOT DETECTED Final   Klebsiella  oxytoca NOT DETECTED NOT DETECTED Final   Klebsiella pneumoniae NOT DETECTED NOT DETECTED Final   Proteus species NOT DETECTED NOT DETECTED Final   Salmonella species NOT DETECTED NOT DETECTED Final   Serratia marcescens NOT DETECTED NOT DETECTED Final   Haemophilus influenzae NOT DETECTED NOT DETECTED Final   Neisseria meningitidis NOT DETECTED NOT DETECTED Final   Pseudomonas aeruginosa NOT DETECTED NOT DETECTED Final   Stenotrophomonas maltophilia NOT DETECTED NOT DETECTED Final   Candida albicans NOT DETECTED NOT DETECTED Final   Candida auris NOT DETECTED NOT DETECTED Final   Candida glabrata NOT DETECTED NOT DETECTED Final   Candida krusei NOT DETECTED NOT DETECTED Final   Candida parapsilosis NOT DETECTED NOT DETECTED Final   Candida tropicalis NOT DETECTED NOT DETECTED Final   Cryptococcus neoformans/gattii NOT DETECTED NOT DETECTED Final   CTX-M ESBL NOT DETECTED NOT DETECTED Final   Carbapenem resistance IMP NOT DETECTED NOT DETECTED Final   Carbapenem resistance KPC NOT DETECTED NOT DETECTED Final   Carbapenem resistance NDM NOT DETECTED NOT DETECTED Final   Carbapenem resist OXA 48 LIKE NOT DETECTED NOT DETECTED Final   Carbapenem resistance VIM NOT DETECTED NOT DETECTED Final    Comment: Performed at [facility], [address], [place], [place] [address]  Resp Panel by RT-PCR (Flu A&B, Covid) Nasopharyngeal Swab     Status: None   Collection Time: 09/17/20 11:37 PM   Specimen: Nasopharyngeal Swab; Nasopharyngeal(NP) swabs in vial transport medium  Result Value Ref Range Status   SARS Coronavirus 2 by RT PCR NEGATIVE NEGATIVE Final    Comment: (NOTE) SARS-CoV-2 target nucleic acids are NOT DETECTED.  The SARS-CoV-2 RNA is generally detectable in upper respiratory specimens during the acute phase of infection. The lowest concentration of SARS-CoV-2 viral copies this assay can detect is 138 copies/mL. A negative result does not preclude SARS-Cov-2 infection and  should not be used as the sole basis for treatment or other patient management decisions. A negative result may occur with  improper specimen collection/handling, submission of specimen other than nasopharyngeal swab, presence of viral mutation(s) within the areas targeted by this assay, and inadequate number of viral copies(<138 copies/mL). A negative result must be combined with clinical observations, patient history, and epidemiological information. The expected result is Negative.  Fact Sheet for Patients:  EntrepreneurPulse.com.au  Fact Sheet for Healthcare Providers:  IncredibleEmployment.be  This test is no t yet approved or cleared by the [place] FDA and  has been authorized for detection and/or diagnosis of SARS-CoV-2 by FDA under an Emergency Use Authorization (EUA). This EUA will remain  in effect (meaning this test can be used) for the duration of the COVID-19 declaration under Section 564(b)(1) of the Act, 21 U.S.C.section 360bbb-3(b)(1), unless the authorization is terminated  or revoked sooner.       Influenza A by PCR NEGATIVE NEGATIVE Final   Influenza B by PCR NEGATIVE NEGATIVE Final    Comment: (NOTE) The Xpert Xpress SARS-CoV-2/FLU/RSV plus assay is intended as an aid in the diagnosis of influenza from Nasopharyngeal swab specimens and should not be used as a sole basis for treatment. Nasal washings and aspirates are unacceptable for Xpert Xpress SARS-CoV-2/FLU/RSV testing.  Fact Sheet for Patients: EntrepreneurPulse.com.au  Fact Sheet for Healthcare Providers: IncredibleEmployment.be  This test is not yet approved or cleared by the [place] FDA and has been authorized for detection and/or diagnosis of SARS-CoV-2 by FDA under an Emergency  Use Authorization (EUA). This EUA will remain in effect (meaning this test can be used) for the duration of the COVID-19 declaration  under Section 564(b)(1) of the Act, 21 U.S.C. section 360bbb-3(b)(1), unless the authorization is terminated or revoked.  Performed at [facility], [place] [address], [place], [place] [address]   Urine culture     Status: Abnormal   Collection Time: 09/18/20 12:40 AM   Specimen: In/Out Cath Urine  Result Value Ref Range Status   Specimen Description   Final    IN/OUT CATH URINE Performed at [facility] [address] [place], [place] [address]    Special Requests   Final    NONE Performed at [facility], [place] [address], [place], [place] [address]    Culture >=100,000 COLONIES/mL ESCHERICHIA COLI (A)  Final   Report Status 09/20/2020 FINAL  Final   Organism ID, Bacteria ESCHERICHIA COLI (A)  Final      Susceptibility   Escherichia coli - MIC*    AMPICILLIN 4 SENSITIVE Sensitive     CEFAZOLIN <=4 SENSITIVE Sensitive     CEFEPIME <=0.12 SENSITIVE Sensitive     CEFTRIAXONE <=0.25 SENSITIVE Sensitive     CIPROFLOXACIN <=0.25 SENSITIVE Sensitive     GENTAMICIN <=1 SENSITIVE Sensitive     IMIPENEM <=0.25 SENSITIVE Sensitive     NITROFURANTOIN <=16 SENSITIVE Sensitive     TRIMETH/SULFA <=20 SENSITIVE Sensitive     AMPICILLIN/SULBACTAM <=2 SENSITIVE Sensitive     PIP/TAZO <=4 SENSITIVE Sensitive     * >=100,000 COLONIES/mL ESCHERICHIA COLI  MRSA PCR Screening     Status: None   Collection Time: 09/18/20  1:40 PM   Specimen: Nasopharyngeal  Result Value Ref Range Status   MRSA by PCR NEGATIVE NEGATIVE Final    Comment:        The GeneXpert MRSA Assay (FDA approved for NASAL specimens only), is one component of a comprehensive MRSA colonization surveillance program. It is not intended to diagnose MRSA infection nor to guide or monitor treatment for MRSA infections. Performed at [facility], [place] [address], [place], [place] [address]      Radiology Studies: No results found.  Scheduled  Meds: . insulin aspart  0-15 Units Subcutaneous TID WC  . insulin aspart  0-5 Units Subcutaneous QHS   Continuous Infusions: . sodium chloride    . cefTRIAXone (ROCEPHIN)  IV 2 g (09/22/20 2313)  . dextrose 100 mL/hr at 09/23/20 1239     LOS: 5 days   Marylu Lund, MD [facility] Pager On Amion  If 7PM-7AM, please contact night-coverage 09/23/2020, 2:42 PM

## 2020-09-23 NOTE — Plan of Care (Signed)
Problem: Pain Managment: Goal: General experience of comfort will improve Outcome: Progressing   Problem: Safety: Goal: Ability to remain free from injury will improve Outcome: Completed/Met   Problem: Fluid Volume: Goal: Hemodynamic stability will improve Outcome: Completed/Met

## 2020-09-23 NOTE — Progress Notes (Signed)
Right lower extremity venous duplex has been completed. Preliminary results can be found in CV Proc through chart review.  Results were given to Dr. Wyline Copas.  09/23/20 3:24 PM Edward Wells RVT

## 2020-09-23 NOTE — TOC Progression Note (Signed)
Transition of Care [facility]) - Progression Note    Patient Details  Name: Edward Wells MRN: [id number] Date of Birth: October 15, 1945  Transition of [facility]) CM/SW Contact  Joaquin Courts, RN Phone Number: 09/23/2020, 2:29 PM  Clinical Narrative:    CM spoke with patient and niece and discussed recommendations for HHPT.  Patient is agreeable to services, reports he will be moving to [place] at some point, but first has to sell his home and get everything ready for the move.  He anticipates being in the [place] area in the immediate future, niece states it would be good for him to have HHPT so he can e stringer for the move. [facility] to provide hhpt services.  [employer] to provide dme rolling walker.   Expected Discharge Plan: [place] Barriers to Discharge: Continued Medical Work up  Expected Discharge Plan and Services Expected Discharge Plan: [place]   Discharge Planning Services: CM Consult   Living arrangements for the past 2 months: Single Family Home                 DME Arranged: Walker rolling DME Agency: Other - Comment ([employer]) Date DME Agency Contacted: 09/23/20 Time DME Agency Contacted: [phone] Representative spoke with at DME Agency: Brenton Grills HH Arranged: PT [place]: [place] ([place]) Date [place]: 09/23/20 Time [place]: [place] Representative spoke with at [place]: Ramond Marrow   Social Determinants of Health ([place]) Interventions    Readmission Risk Interventions No flowsheet data found.

## 2020-09-23 NOTE — Progress Notes (Signed)
[place] for IV heparin Indication: DVT  Allergies  Allergen Reactions  . Furosemide     Muscle cramps    Patient Measurements: Height: 6' (182.9 cm) Weight: 96.1 kg (211 lb 13.8 oz) IBW/kg (Calculated) : 77.6 Heparin Dosing Weight: 94.8 kg  Vital Signs: Temp: 98.7 F (37.1 C) (12/20 1538) Temp Source: Oral (12/20 1538) BP: 106/73 (12/20 1538) Pulse Rate: 86 (12/20 1538)  Labs: Recent Labs    09/21/20 0543 09/22/20 0538 09/23/20 0509  HGB 10.7* 9.6* 10.1*  HCT 34.0* 31.7* 33.6*  PLT 41* 63* 96*  CREATININE 1.39* 1.18 0.99    Estimated Creatinine Clearance: 81.1 mL/min (by C-G formula based on SCr of 0.99 mg/dL).   Medical History: Past Medical History:  Diagnosis Date  . Acute gastric ulcer with hemorrhage 07/30/2014  . Hypertension     Medications:  Medications Prior to Admission  Medication Sig Dispense Refill Last Dose  . acetaminophen (TYLENOL) 325 MG tablet Take 975 mg by mouth daily as needed for mild pain.   Past Month at Unknown time  . Coenzyme Q10 (CO Q 10) 100 MG CAPS Take 100 mg by mouth daily.   Past Week at Unknown time  . losartan (COZAAR) 50 MG tablet Take 50 mg by mouth daily.   Past Week at Unknown time  . Multiple Vitamin (MULTIVITAMIN) tablet Take 1 tablet by mouth daily.   Past Week at Unknown time  . tiZANidine (ZANAFLEX) 2 MG tablet Take 1 tablet by mouth 3 (three) times daily as needed for muscle spasms.   Past Week at Unknown time  . cefdinir (OMNICEF) 300 MG capsule Take 1 capsule (300 mg total) by mouth 2 (two) times daily. (Patient not taking: Reported on 09/18/2020) 28 capsule 0 Not Taking at Unknown time   Scheduled:  . insulin aspart  0-15 Units Subcutaneous TID WC  . insulin aspart  0-5 Units Subcutaneous QHS   Infusions:  . sodium chloride    . cefTRIAXone (ROCEPHIN)  IV 2 g (09/22/20 2313)  . dextrose 100 mL/hr at 09/23/20 1239  . heparin 1,500 Units/hr (09/23/20 1702)    Assessment: 31 yoM admitted with sepsis d/t pyelonephritis. Noted to have RLE edema on 12/20 and LE doppler performed which revealed extensive R DVT. Pharmacy consulted to dose heparin. Per discussion with MD, low platelets felt due primarily to sepsis and are showing good recovery at time of heparin initiation.   Baseline INR, aPTT: INR mildly elevated; aPTT WNL  Prior anticoagulation: none  Significant events:  Today, 09/23/2020:  CBC: Hgb low but stable since 12/18; Plt low but now > 50k and steadily trending up  No bleeding or infusion issues per nursing  Goal of Therapy: Heparin level 0.3-0.7 units/ml Monitor platelets by anticoagulation protocol: Yes  Plan:  Heparin 3000 units IV bolus x 1  Heparin 1500 units/hr IV infusion  Check heparin level 8 hrs after start  Daily CBC, daily heparin level once stable  Monitor for signs of bleeding or thrombosis  Reuel Boom, PharmD, BCPS [phone] 09/23/2020, 5:06 PM

## 2020-09-24 ENCOUNTER — Inpatient Hospital Stay (HOSPITAL_COMMUNITY): Payer: Medicare Other

## 2020-09-24 DIAGNOSIS — E860 Dehydration: Secondary | ICD-10-CM

## 2020-09-24 DIAGNOSIS — R6510 Systemic inflammatory response syndrome (SIRS) of non-infectious origin without acute organ dysfunction: Secondary | ICD-10-CM

## 2020-09-24 DIAGNOSIS — I361 Nonrheumatic tricuspid (valve) insufficiency: Secondary | ICD-10-CM

## 2020-09-24 DIAGNOSIS — R079 Chest pain, unspecified: Secondary | ICD-10-CM

## 2020-09-24 DIAGNOSIS — N139 Obstructive and reflux uropathy, unspecified: Secondary | ICD-10-CM

## 2020-09-24 LAB — COMPREHENSIVE METABOLIC PANEL
ALT: 34 U/L (ref 0–44)
AST: 28 U/L (ref 15–41)
Albumin: 2.2 g/dL — ABNORMAL LOW (ref 3.5–5.0)
Alkaline Phosphatase: 48 U/L (ref 38–126)
Anion gap: 7 (ref 5–15)
BUN: 23 mg/dL (ref 8–23)
CO2: 25 mmol/L (ref 22–32)
Calcium: 8.1 mg/dL — ABNORMAL LOW (ref 8.9–10.3)
Chloride: 114 mmol/L — ABNORMAL HIGH (ref 98–111)
Creatinine, Ser: 1.04 mg/dL (ref 0.61–1.24)
GFR, Estimated: >60 mL/min (ref >60)
Glucose, Bld: 211 mg/dL — ABNORMAL HIGH (ref 70–99)
Potassium: 3.8 mmol/L (ref 3.5–5.1)
Sodium: 146 mmol/L — ABNORMAL HIGH (ref 135–145)
Total Bilirubin: 0.3 mg/dL (ref 0.3–1.2)
Total Protein: 5 g/dL — ABNORMAL LOW (ref 6.5–8.1)

## 2020-09-24 LAB — BASIC METABOLIC PANEL
Anion gap: 6 (ref 5–15)
BUN: 30 mg/dL — ABNORMAL HIGH (ref 8–23)
CO2: 26 mmol/L (ref 22–32)
Calcium: 8.2 mg/dL — ABNORMAL LOW (ref 8.9–10.3)
Chloride: 116 mmol/L — ABNORMAL HIGH (ref 98–111)
Creatinine, Ser: 1.04 mg/dL (ref 0.61–1.24)
GFR, Estimated: >60 mL/min (ref >60)
Glucose, Bld: 156 mg/dL — ABNORMAL HIGH (ref 70–99)
Potassium: 3.6 mmol/L (ref 3.5–5.1)
Sodium: 148 mmol/L — ABNORMAL HIGH (ref 135–145)

## 2020-09-24 LAB — TROPONIN I (HIGH SENSITIVITY)
Troponin I (High Sensitivity): 220 ng/L (ref <18)
Troponin I (High Sensitivity): 1017 ng/L (ref <18)
Troponin I (High Sensitivity): 1061 ng/L (ref <18)
Troponin I (High Sensitivity): 885 ng/L (ref <18)
Troponin I (High Sensitivity): 873 ng/L (ref <18)

## 2020-09-24 LAB — CBC
HCT: 27.1 % — ABNORMAL LOW (ref 39.0–52.0)
Hemoglobin: 8.3 g/dL — ABNORMAL LOW (ref 13.0–17.0)
MCH: 27 pg (ref 26.0–34.0)
MCHC: 30.6 g/dL (ref 30.0–36.0)
MCV: 88.3 fL (ref 80.0–100.0)
Platelets: 132 10*3/uL — ABNORMAL LOW (ref 150–400)
RBC: 3.07 MIL/uL — ABNORMAL LOW (ref 4.22–5.81)
RDW: 14.8 % (ref 11.5–15.5)
WBC: 10.3 10*3/uL (ref 4.0–10.5)
nRBC: 0 % (ref 0.0–0.2)

## 2020-09-24 LAB — CBC WITH DIFFERENTIAL/PLATELET
Abs Immature Granulocytes: 0.07 10*3/uL (ref 0.00–0.07)
Basophils Absolute: 0 10*3/uL (ref 0.0–0.1)
Basophils Relative: 0 %
Eosinophils Absolute: 0.1 10*3/uL (ref 0.0–0.5)
Eosinophils Relative: 1 %
HCT: 28.5 % — ABNORMAL LOW (ref 39.0–52.0)
Hemoglobin: 8.7 g/dL — ABNORMAL LOW (ref 13.0–17.0)
Immature Granulocytes: 1 %
Lymphocytes Relative: 12 %
Lymphs Abs: 1.5 10*3/uL (ref 0.7–4.0)
MCH: 26.9 pg (ref 26.0–34.0)
MCHC: 30.5 g/dL (ref 30.0–36.0)
MCV: 88.2 fL (ref 80.0–100.0)
Monocytes Absolute: 0.7 10*3/uL (ref 0.1–1.0)
Monocytes Relative: 5 %
Neutro Abs: 9.9 10*3/uL — ABNORMAL HIGH (ref 1.7–7.7)
Neutrophils Relative %: 81 %
Platelets: 118 10*3/uL — ABNORMAL LOW (ref 150–400)
RBC: 3.23 MIL/uL — ABNORMAL LOW (ref 4.22–5.81)
RDW: 14.8 % (ref 11.5–15.5)
WBC: 12.3 10*3/uL — ABNORMAL HIGH (ref 4.0–10.5)
nRBC: 0 % (ref 0.0–0.2)

## 2020-09-24 LAB — GLUCOSE, CAPILLARY
Glucose-Capillary: 160 mg/dL — ABNORMAL HIGH (ref 70–99)
Glucose-Capillary: 134 mg/dL — ABNORMAL HIGH (ref 70–99)
Glucose-Capillary: 142 mg/dL — ABNORMAL HIGH (ref 70–99)
Glucose-Capillary: 135 mg/dL — ABNORMAL HIGH (ref 70–99)

## 2020-09-24 LAB — LACTATE DEHYDROGENASE: LDH: 235 U/L — ABNORMAL HIGH (ref 98–192)

## 2020-09-24 LAB — ECHOCARDIOGRAM COMPLETE
Area-P 1/2: 4.89 cm2
Calc EF: 76.1 %
Height: 72 in
S' Lateral: 2.3 cm
Single Plane A2C EF: 76.5 %
Single Plane A4C EF: 74.3 %
Weight: 3389.79 oz

## 2020-09-24 LAB — OCCULT BLOOD X 1 CARD TO LAB, STOOL: Fecal Occult Bld: POSITIVE — AB

## 2020-09-24 LAB — HEPARIN LEVEL (UNFRACTIONATED)
Heparin Unfractionated: 0.27 IU/mL — ABNORMAL LOW (ref 0.30–0.70)
Heparin Unfractionated: <0.1 IU/mL — ABNORMAL LOW (ref 0.30–0.70)
Heparin Unfractionated: 0.4 IU/mL (ref 0.30–0.70)

## 2020-09-24 LAB — MAGNESIUM: Magnesium: 1.4 mg/dL — ABNORMAL LOW (ref 1.7–2.4)

## 2020-09-24 LAB — LACTIC ACID, PLASMA
Lactic Acid, Venous: 0.8 mmol/L (ref 0.5–1.9)
Lactic Acid, Venous: 2 mmol/L (ref 0.5–1.9)

## 2020-09-24 LAB — CK: Total CK: 32 U/L — ABNORMAL LOW (ref 49–397)

## 2020-09-24 LAB — CORTISOL: Cortisol, Plasma: 13.2 ug/dL

## 2020-09-24 MED ORDER — PANTOPRAZOLE SODIUM 40 MG PO TBEC
40.0000 mg | DELAYED_RELEASE_TABLET | Freq: Two times a day (BID) | ORAL | Status: DC
  Administered 2020-09-24 – 2020-09-25 (×2): 40 mg via ORAL
  Filled 2020-09-24 (×2): qty 1

## 2020-09-24 MED ORDER — PANTOPRAZOLE SODIUM 40 MG PO TBEC: 40.0000 mg | DELAYED_RELEASE_TABLET | Freq: Two times a day (BID) | ORAL | Status: DC

## 2020-09-24 MED ORDER — POTASSIUM CHLORIDE CRYS ER 20 MEQ PO TBCR
40.0000 meq | EXTENDED_RELEASE_TABLET | Freq: Once | ORAL | Status: AC
  Administered 2020-09-24: 23:00:00 30 meq via ORAL
  Filled 2020-09-24: qty 2

## 2020-09-24 MED ORDER — GLUCERNA SHAKE PO LIQD
237.0000 mL | Freq: Three times a day (TID) | ORAL | Status: DC
  Administered 2020-09-27 – 2020-10-02 (×13): 237 mL via ORAL
  Filled 2020-09-24 (×27): qty 237

## 2020-09-24 MED ORDER — MAGNESIUM SULFATE 4 GM/100ML IV SOLN
4.0000 g | Freq: Once | INTRAVENOUS | Status: AC
  Administered 2020-09-24: 09:00:00 4 g via INTRAVENOUS
  Filled 2020-09-24: qty 100

## 2020-09-24 NOTE — Progress Notes (Signed)
Pharmacy - IV heparin  Assessment:    Please see note from Lavonia Drafts) Irven Easterly, PharmD earlier today for full details.  Briefly, 74 y.o. male on IV heparin for acute DVT.   FOBT checked d/t dark stool and anemia; returned positive  Heparin held ~1p-3p today (also charted as paused ~930a but this is incorrect per RN)  AM heparin level slightly low this AM, 10a HL lower despite rate increase and RN confirming no infusion issues prior to level draw  Per CCM, cautiously continuing heparin for now  No new bleeding per RN  Plan:   Would normally increase heparin rate with low level and RN confirming good infusion. However, given active GIB and level dropping despite rate increase, will recheck heparin level before increasing rate.  Continue heparin at 1650 units/hr  Recheck 8 hr heparin level  Reuel Boom, PharmD, BCPS [phone] 09/24/2020, 6:52 PM

## 2020-09-24 NOTE — Progress Notes (Signed)
Pharmacy - IV heparin  Assessment:    Please see my note from earlier today for full details.  Briefly, 74 y.o. male on IV heparin for acute DVT.   FOBT checked d/t dark stool and anemia; returned positive  Heparin held ~1p-3p today (also charted as paused ~930a but this is incorrect per RN)  AM heparin level slightly low this AM, 10a HL lower despite rate increase and RN confirming no infusion issues prior to level draw  Repeat HL tonight now therapeutic- 0.4   Per CCM, cautiously continuing heparin for now  No new bleeding per RN  Plan:   Continue heparin at 1650 units/hr  Recheck 8 hr heparin level  Daily heparin level & CBC while on heparin  Netta Cedars, PharmD, BCPS 09/24/2020, 11:26 PM

## 2020-09-24 NOTE — Progress Notes (Signed)
FOBT positive, Dr. Marliss Czar from [facility] called for evaluation. Await return call.    Noe Gens, MSN, NP-C, AGACNP-BC [facility] 09/24/2020, 3:29 PM   Please see [email or web address] for pager details.

## 2020-09-24 NOTE — Progress Notes (Signed)
CRITICAL VALUE ALERT  Critical Value:  Troponin at 220  Date & Time Notied: 09/24/2020 1230am  Provider Notified: NP Sharlet Salina notified  Orders Received/Actions taken: Continue to monitor; Follow up with AM labs

## 2020-09-24 NOTE — Progress Notes (Signed)
PT Cancellation Note  Patient Details Name: Ruben Mahler MRN: [id number] DOB: 1945/10/28   Cancelled Treatment:    Reason Eval/Treat Not Completed: Medical issues which prohibited therapy, hypotension, on pressers. Will check back another time.    Claretha Cooper 09/24/2020, 2:58 PM .[place] Pager [phone] Office [phone]

## 2020-09-24 NOTE — Progress Notes (Signed)
  Echocardiogram 2D Echocardiogram has been performed.  Bobbye Charleston 09/24/2020, 12:23 PM

## 2020-09-24 NOTE — Progress Notes (Signed)
Patient is followed by [facility]. Discussed case with GI, appreciate assistance. Plan is for possible scope tomorrow afternoon.    Plan:  Repeat troponin now Reassess BMP, CBC now  Follow up renal US  Edward Cressler, MSN, NP-C, AGACNP-BC [facility] 09/24/2020, 4:13 PM   Please see [email or web address] for pager details.

## 2020-09-24 NOTE — Progress Notes (Signed)
[place] for IV heparin Indication: DVT  Allergies  Allergen Reactions  . Furosemide     Muscle cramps    Patient Measurements: Height: 6' (182.9 cm) Weight: 96.1 kg (211 lb 13.8 oz) IBW/kg (Calculated) : 77.6 Heparin Dosing Weight: 94.8 kg  Vital Signs: Temp: 99.7 F (37.6 C) (12/21 0000) Temp Source: Axillary (12/21 0000) BP: 124/70 (12/21 0045) Pulse Rate: 98 (12/21 0045)  Labs: Recent Labs    09/23/20 0509 09/23/20 2224 09/24/20 0111  HGB 10.1* 9.2* 8.7*  HCT 33.6* 30.2* 28.5*  PLT 96* 108* 118*  HEPARINUNFRC  --   --  0.27*  CREATININE 0.99 1.14 1.04  CKTOTAL  --  32*  --   TROPONINIHS  --  220*  --     Estimated Creatinine Clearance: 77.2 mL/min (by C-G formula based on SCr of 1.04 mg/dL).   Medical History: Past Medical History:  Diagnosis Date  . Acute gastric ulcer with hemorrhage 07/30/2014  . Hypertension     Medications:  Medications Prior to Admission  Medication Sig Dispense Refill Last Dose  . acetaminophen (TYLENOL) 325 MG tablet Take 975 mg by mouth daily as needed for mild pain.   Past Month at Unknown time  . Coenzyme Q10 (CO Q 10) 100 MG CAPS Take 100 mg by mouth daily.   Past Week at Unknown time  . losartan (COZAAR) 50 MG tablet Take 50 mg by mouth daily.   Past Week at Unknown time  . Multiple Vitamin (MULTIVITAMIN) tablet Take 1 tablet by mouth daily.   Past Week at Unknown time  . tiZANidine (ZANAFLEX) 2 MG tablet Take 1 tablet by mouth 3 (three) times daily as needed for muscle spasms.   Past Week at Unknown time  . cefdinir (OMNICEF) 300 MG capsule Take 1 capsule (300 mg total) by mouth 2 (two) times daily. (Patient not taking: Reported on 09/18/2020) 28 capsule 0 Not Taking at Unknown time   Scheduled:  . Chlorhexidine Gluconate Cloth  6 each Topical Daily  . insulin aspart  0-15 Units Subcutaneous TID WC  . insulin aspart  0-5 Units Subcutaneous QHS   Infusions:  . sodium chloride    .  sodium chloride Stopped (09/23/20 2024)  . cefTRIAXone (ROCEPHIN)  IV Stopped (09/23/20 2314)  . dextrose 100 mL/hr at 09/24/20 0034  . heparin 1,500 Units/hr (09/23/20 2022)  . phenylephrine (NEO-SYNEPHRINE) Adult infusion 45 mcg/min (09/24/20 0033)   Assessment: 28 yoM admitted with sepsis d/t pyelonephritis. Noted to have RLE edema on 12/20 and LE doppler performed which revealed extensive R DVT. Pharmacy consulted to dose heparin. Per discussion with MD, low platelets felt due primarily to sepsis and are showing good recovery at time of heparin initiation.   Baseline INR, aPTT: INR mildly elevated; aPTT WNL  Prior anticoagulation: none  Significant events:  Today, 09/24/2020:  Initial heparin level 0.27- subtherapeutic on 1500 units/hr  CBC: Hgb low 8.7; Plt low but now > 50k and steadily trending up  No bleeding or infusion issues per nursing  Goal of Therapy: Heparin level 0.3-0.7 units/ml Monitor platelets by anticoagulation protocol: Yes  Plan:  Increase heparin 1650 units/hr IV infusion  Check heparin level 8 hrs after rate increase  Daily CBC, daily heparin level once stable  Monitor for signs of bleeding or thrombosis  Netta Cedars, PharmD, BCPS 09/24/2020, 1:37 AM

## 2020-09-24 NOTE — Progress Notes (Signed)
[place] Progress Note Patient Name: Edward Wells DOB: Nov 27, 1945 MRN: [id number]   Date of Service  09/24/2020  HPI/Events of Note  Hypokalemia - K+ = 3.6 and Creatinine = 1.04.  eICU Interventions  Will replace K+.      Intervention Category Major Interventions: Electrolyte abnormality - evaluation and management  Lysle Dingwall 09/24/2020, 7:37 PM

## 2020-09-24 NOTE — TOC Progression Note (Signed)
Transition of Care [facility]) - Progression Note    Patient Details  Name: Edward Wells MRN: [id number] Date of Birth: 1945/12/15  [facility] of [facility]) CM/SW Contact  Leeroy Cha, RN Phone Number: 09/24/2020, 8:05 AM  Clinical Narrative:    Cardiac episode last pm early am, transferred for med surg to sdu o2 at 4l/min, iv rocephin, heparin, mgso4, iv neosyphrine drip, wcb 12.3 hgb 8.7, troponin-220, total cpk-32/ Plan observed for toc needs patient was to go home today Following for progression.  Expected Discharge Plan: [place] Barriers to Discharge: Continued Medical Work up  Expected Discharge Plan and Services Expected Discharge Plan: [place]   Discharge Planning Services: CM Consult   Living arrangements for the past 2 months: Single Family Home                 DME Arranged: [place] DME Agency: Other - [employer]) Date DME Agency Contacted: 09/23/20 Time DME Agency Contacted: [phone] Representative spoke with at DME Agency: Brenton Grills HH Arranged: PT [place]: [place] ([place]) Date [place]: 09/23/20 Time [place]: [place] Representative spoke with at [place]: Ramond Marrow   Social Determinants of Health ([place]) Interventions    Readmission Risk Interventions No flowsheet data found.

## 2020-09-24 NOTE — Progress Notes (Addendum)
NAME:  Edward Wells, MRN:  [id number], DOB:  1946-07-13, LOS: 6 ADMISSION DATE:  09/17/2020, CONSULTATION DATE:  12/15 REFERRING MD:  Roxanne Mins, CHIEF COMPLAINT:  Found down   Brief History   74 y/o male brought to the ED on 12/14 after he was not answering his neighbor's calls, noted to be in septic shock due to pyelonephritis. He underwent cystoscopy with right ureteral stent placement. Returned to ICU in shock, AFwRVR, AKI due to obstructing stones.  Cultures positive for E-Coli bacteremia.  Transferred to medicine service 12/17.  Returned back to ICU with AMS and hypotension on 12/21 early am.    Past Medical History  Hypertension Acute gastric ulcer with hemorrhage  Significant Hospital Events   12/14 Admit with pyelonephritis and septic shock 12/15 Off levophed 12/16 Cultures positive for e-coli, hemodynamically stable. Amiodarone stopped, in SR.  12/17 To [facility]  12/21 PCCM called back for hypotension  Consults:  Urology  Procedures:    Significant Diagnostic Tests:   CT abdomen 12/15 >> 3 obstructing right proximal ureteral calculi causing moderate right hydronephrosis, pancolonic moderate diverticulosis  LE Venous Duplex 12/20 >> acute DVT in the R external iliac vein, common femoral vein, right femoral vein, right popliteal vein, right posterior tibial veins, and right peroneal veins  Renal US 12/21 >>   Micro Data:  COVID 12/14 >> negative  Influenza A/B 12/14 >> negative  BCID 12/14 >> enterobacterales, e-coli detected  UC 12/15 >> 100k E-Coli >> pan sensitive  BCx2 12/14 >> E-Coli >> pan sensitive    Antimicrobials:  Cefepime 12/14 >> 12/15 Ceftriaxone 12/15 >>   Interim history/subjective:  RN reports pt had BM that appeared / smelled like old blood, incontinent of urine x2 episodes  Pt transferred back to ICU with AMS, hypotension Pt reports intermittent lower abdominal pain, nausea Tmax 100.6 / WBC 12.3  Glucose range 134-160   Objective   Blood pressure  (!) 115/59, pulse (!) 106, temperature (!) 100.6 F (38.1 C), temperature source Axillary, resp. rate 20, height 6' (1.829 m), weight 96.1 kg, SpO2 95 %.        Intake/Output Summary (Last 24 hours) at 09/24/2020 1319 Last data filed at 09/24/2020 [id number] Gross per 24 hour  Intake 3310.22 ml  Output 1 ml  Net 3309.22 ml   Filed Weights   09/18/20 1103 09/18/20 1400 09/23/20 0451  Weight: 103.9 kg 94.8 kg 96.1 kg    Examination: General: adult male lying in bed in NAD   HEENT: MM pink/dry, fair dentition, pupils reactive  Neuro: AAOx4, speech clear, MAE  CV: s1s2 RRR, on 74mcg neosynephrine, no m/r/g PULM: non-labored on 4L/[place], lungs bilaterally clear GI: soft, bsx4 active, no bruising noted on abdomen  Extremities: warm/dry, 1-2+ LE pitting edema  Skin: no rashes or lesions  Resolved Hospital Problem list   [facility]  Assessment & Plan:   Septic shock due to E-Coli Bacteremia in setting of Pyelonephritis Volume resuscitated, required levophed on admit, pressors off 12/15. Lactic acid cleared. S/p cystoscopy with right ureteral stent placement per Urology 12/15.  Returned back to ICU on 12/21 with hypotension on neo.  -neosynephrine for MAP >65 -assess FOBT, not Hgb drift  -assess cortisol level, if <10 add stress dose steroids -assess lactic acid  -continue ceftriaxone for targeted organism coverage -tele monitoring   Obstructive hydronephrosis from nephrolithiasis AKI  Hypernatremia  Hypomagnesemia  Admit BUN 211 -repeat renal US to ensure no abscess, decompression of kidney from stent  -D5w at 156ml/hr for  hypernatremia  -Trend BMP / urinary output -Replace electrolytes as indicated -Avoid nephrotoxic agents, ensure adequate renal perfusion  Anemia  Thrombocytopenia  Normal fibrinogen but may be acute phase reactant (consumption despite normal fibrinogen), no schistocytes.  Suspect changes in setting of severe sepsis rather than DIC.  -follow CBC closely  -monitor  for bleeding  -transfuse for Hgb <7% or active bleeding   Dark Stool, Anemia, Rule out GIB  Hx of gastric ulcers in past, on heparin for DVT  -assess FOBT, if positive will need GI evaluation given hx  -must also consider RP bleed, though abdominal exam benign  RLE DVT  -continue heparin gtt for now -monitor closely for bleeding  -if hemodynamically significant bleed, may need to place IVC filter.   Hyperglycemia: New diagnosis of diabetes (history supports) Hgb A1c 6.6 on admit  -SSI, moderate scale  -will need oral agent closer to discharge  -carb modified diet as tolerated   Moderate Protein Calorie Malnutrition  -nutrition consult -glucerna supplement  -MVI  Best practice (evaluated daily)  Diet: Diabetic diet  Pain/Anxiety/Delirium protocol (if indicated): n/a VAP protocol (if indicated): n/a DVT prophylaxis: SCD GI prophylaxis: n/a Glucose control: as above Mobility: up ad lib Last date of multidisciplinary goals of care discussion: 12/21  Family and staff present: Niece Magda Paganini Summary of discussion: Reviewed goals of care in the event of critical illness to include cardiac arrest or respiratory failure and they elect for DNR. Medical care otherwise.  Follow up goals of care discussion due 12/28 Code Status: DNR  Disposition: ICU  Labs   CBC: Recent Labs  Lab 09/20/20 0235 09/21/20 0543 09/22/20 0538 09/23/20 0509 09/23/20 2224 09/24/20 0111  WBC 12.9* 8.0 7.1 9.4 12.0* 12.3*  NEUTROABS 10.9* 6.0 5.3 7.4  --  9.9*  HGB 12.4* 10.7* 9.6* 10.1* 9.2* 8.7*  HCT 39.4 34.0* 31.7* 33.6* 30.2* 28.5*  MCV 84.7 85.4 87.6 89.8 88.8 88.2  PLT 34* 41* 63* 96* 108* 118*    Basic Metabolic Panel: Recent Labs  Lab 09/17/20 2314 09/18/20 0929 09/21/20 0543 09/22/20 0538 09/23/20 0509 09/23/20 2224 09/24/20 0111  NA 146*   < > 155* 153* 155* 147* 146*  K 3.9   < > 4.2 4.0 3.9 3.5 3.8  CL 114*   < > 119* 117* 119* 114* 114*  CO2 18*   < > 25 26 27 24 25    GLUCOSE 308*   < > 237* 203* 184* 207* 211*  BUN 211*   < > 63* 33* 22 18 23   CREATININE 3.61*   < > 1.39* 1.18 0.99 1.14 1.04  CALCIUM 7.7*   < > 8.5* 8.6* 8.9 8.3* 8.1*  MG 3.3*  --   --  1.7  --   --  1.4*   < > = values in this interval not displayed.   GFR: Estimated Creatinine Clearance: 77.2 mL/min (by C-G formula based on SCr of 1.04 mg/dL). Recent Labs  Lab 09/17/20 2314 09/18/20 0929 09/19/20 0239 09/22/20 0538 09/23/20 0509 09/23/20 2224 09/24/20 0111  WBC 47.3*  --    < > 7.1 9.4 12.0* 12.3*  LATICACIDVEN 3.1* 1.8  --   --   --  1.3 0.8   < > = values in this interval not displayed.    Liver Function Tests: Recent Labs  Lab 09/19/20 0239 09/22/20 0538 09/23/20 0509 09/23/20 2224 09/24/20 0111  AST 12* 23 31 27 28   ALT 18 32 39 36 34  ALKPHOS 93 55  57 55 48  BILITOT 0.5 0.8 0.7 0.7 0.3  PROT 5.3* 5.3* 6.1* 5.4* 5.0*  ALBUMIN 2.0* 2.3* 2.6* 2.5* 2.2*   Recent Labs  Lab 09/17/20 2314  LIPASE 35   No results for input(s): AMMONIA in the last 168 hours.  ABG    Component Value Date/Time   PHART 7.507 (H) 09/23/2020 2020   PCO2ART 28.5 (L) 09/23/2020 2020   PO2ART 92.6 09/23/2020 2020   HCO3 22.4 09/23/2020 2020   O2SAT 97.7 09/23/2020 2020     Coagulation Profile: Recent Labs  Lab 09/18/20 0929  INR 1.5*    Cardiac Enzymes: Recent Labs  Lab 09/23/20 2224  CKTOTAL 32*    HbA1C: Hgb A1c MFr Bld  Date/Time Value Ref Range Status  09/18/2020 09:29 AM 6.6 (H) 4.8 - 5.6 % Final    Comment:    (NOTE) Pre diabetes:          5.7%-6.4%  Diabetes:              >6.4%  Glycemic control for   <7.0% adults with diabetes   02/16/2019 04:57 PM 5.7 (H) 4.8 - 5.6 % Final    Comment:    (NOTE) Pre diabetes:          5.7%-6.4% Diabetes:              >6.4% Glycemic control for   <7.0% adults with diabetes     CBG: Recent Labs  Lab 09/23/20 1656 09/23/20 1907 09/23/20 2018 09/24/20 0800 09/24/20 1151  GLUCAP 141* 150* 167* 160*  134*     Critical care time: 40 minutes   Noe Gens, MSN, NP-C, AGACNP-BC [facility] 09/24/2020, 1:19 PM   Please see [email or web address] for pager details.

## 2020-09-24 NOTE — Progress Notes (Signed)
PROGRESS NOTE    Edward Wells  IWP:[id number] DOB: 10/13/1945 DOA: 09/17/2020 PCP: Elizabeth Palau, MD (Inactive)    Brief Narrative:  74yo who presented with septic shock from pyelonephritis, later undergoing cystoscopy and resultant R ureteral stent placement  Assessment & Plan:   Active Problems:   Pyelonephritis  1. Septic shock from ecoli bacteremia and pyelonephritis present on admit 1. Sepsis physiology resolved 2. Blood and urine cx pos for ecoli 3. Patient had been continued on rocephin 4. Urology had been following and is now s/p stent placement 5. Per Urology, recommendation for 14 days of abx total 6. See overnight events. Pt noted to be markedly hypotensive, not responsive to IVF boluses and ultimately requiring pressor support, now in ICU. Pt remains pressor dependent at this time. Discussed with CCM, care to be transferred to CCM until pt is more hemodynamically stable and off pressor support 2. Thrombocytopenia 1. Plts are steadily improving, currently 118 2. Likely secondary to presenting septic shock 3. Hypernatremia 1. Had been continued on D5w IVF 2. Sodium now much improved to 146 with d5 fluids 3. Would continue to follow serial BMP 4. Afib with RVR 1. Transient and resolved 2. Likely secondary to sepsis 3. On heparin gtt as per below 5. New diagnosis of DM 1. Continue with SSI coverage as needed 2. Consider metformin on d/c 6. RLE edema secondary to DVT 1. Unilateral edema on exam. No calf tenderness 2. LE dopplers reviewed, findings of RLE DVT 3. Now on heparin gtt, consider transition to eliquis when more stable 7. Elevated troponin 1. Pt denies chest pain or sob 2. Given pressor dependent hypotension, suspect possible demand ischemia 3. Have ordered 2d echo  DVT prophylaxis: SCD's Code Status: Full Family Communication: Pt in room, family currently at bedside  Status is: Inpatient  Remains inpatient appropriate because:IV treatments  appropriate due to intensity of illness or inability to take PO and Inpatient level of care appropriate due to severity of illness   Dispo: The patient is from: Home              Anticipated d/c is to: Home              Anticipated d/c date is: 2 days              Patient currently is not medically stable to d/c.   Consultants:   Urology  PCCM  Procedures:   Cystoscopy with stent placement  Antimicrobials: Anti-infectives (From admission, onward)   Start     Dose/Rate Route Frequency Ordered Stop   09/18/20 2200  ceFEPIme (MAXIPIME) 2 g in sodium chloride 0.9 % 100 mL IVPB  Status:  Discontinued        2 g 200 mL/hr over 30 Minutes Intravenous Every 24 hours 09/18/20 1108 09/18/20 1827   09/18/20 2200  cefTRIAXone (ROCEPHIN) 2 g in sodium chloride 0.9 % 100 mL IVPB        2 g 200 mL/hr over 30 Minutes Intravenous Every 24 hours 09/18/20 1827     09/18/20 0000  vancomycin (VANCOREADY) IVPB 2000 mg/400 mL        2,000 mg 200 mL/hr over 120 Minutes Intravenous  Once 09/17/20 2356 09/18/20 0216   09/18/20 0000  ceFEPIme (MAXIPIME) 2 g in sodium chloride 0.9 % 100 mL IVPB        2 g 200 mL/hr over 30 Minutes Intravenous  Once 09/17/20 2356 09/18/20 0107      Subjective: Overnight events  noted. Pt became hypotensive requiring pressor support. Currently denies chest pain or sob  Objective: Vitals:   09/24/20 0700 09/24/20 0800 09/24/20 0802 09/24/20 1200  BP: (!) 96/51 (!) 115/59    Pulse: (!) 119 (!) 106    Resp: 18 20    Temp:   99.6 F (37.6 C) (!) 100.6 F (38.1 C)  TempSrc:   Oral Axillary  SpO2: 94% 95%    Weight:      Height:        Intake/Output Summary (Last 24 hours) at 09/24/2020 1310 Last data filed at 09/24/2020 8502 Gross per 24 hour  Intake 3310.22 ml  Output 1 ml  Net 3309.22 ml   Filed Weights   09/18/20 1103 09/18/20 1400 09/23/20 0451  Weight: 103.9 kg 94.8 kg 96.1 kg    Examination: General exam: Conversant, in no acute  distress Respiratory system: normal chest rise, clear, no audible wheezing Cardiovascular system: regular rhythm, s1-s2 Gastrointestinal system: Nondistended, nontender, pos BS Central nervous system: No seizures, no tremors Extremities: No cyanosis, no joint deformities Skin: No rashes, no pallor Psychiatry: Affect normal // no auditory hallucinations   Data Reviewed: I have personally reviewed following labs and imaging studies  CBC: Recent Labs  Lab 09/20/20 0235 09/21/20 0543 09/22/20 0538 09/23/20 0509 09/23/20 2224 09/24/20 0111  WBC 12.9* 8.0 7.1 9.4 12.0* 12.3*  NEUTROABS 10.9* 6.0 5.3 7.4  --  9.9*  HGB 12.4* 10.7* 9.6* 10.1* 9.2* 8.7*  HCT 39.4 34.0* 31.7* 33.6* 30.2* 28.5*  MCV 84.7 85.4 87.6 89.8 88.8 88.2  PLT 34* 41* 63* 96* 108* 774*   Basic Metabolic Panel: Recent Labs  Lab 09/17/20 2314 09/18/20 0929 09/21/20 0543 09/22/20 0538 09/23/20 0509 09/23/20 2224 09/24/20 0111  NA 146*   < > 155* 153* 155* 147* 146*  K 3.9   < > 4.2 4.0 3.9 3.5 3.8  CL 114*   < > 119* 117* 119* 114* 114*  CO2 18*   < > 25 26 27 24 25   GLUCOSE 308*   < > 237* 203* 184* 207* 211*  BUN 211*   < > 63* 33* 22 18 23   CREATININE 3.61*   < > 1.39* 1.18 0.99 1.14 1.04  CALCIUM 7.7*   < > 8.5* 8.6* 8.9 8.3* 8.1*  MG 3.3*  --   --  1.7  --   --  1.4*   < > = values in this interval not displayed.   GFR: Estimated Creatinine Clearance: 77.2 mL/min (by C-G formula based on SCr of 1.04 mg/dL). Liver Function Tests: Recent Labs  Lab 09/19/20 0239 09/22/20 0538 09/23/20 0509 09/23/20 2224 09/24/20 0111  AST 12* 23 31 27 28   ALT 18 32 39 36 34  ALKPHOS 93 55 57 55 48  BILITOT 0.5 0.8 0.7 0.7 0.3  PROT 5.3* 5.3* 6.1* 5.4* 5.0*  ALBUMIN 2.0* 2.3* 2.6* 2.5* 2.2*   Recent Labs  Lab 09/17/20 2314  LIPASE 35   No results for input(s): AMMONIA in the last 168 hours. Coagulation Profile: Recent Labs  Lab 09/18/20 0929  INR 1.5*   Cardiac Enzymes: Recent Labs  Lab  09/23/20 2224  CKTOTAL 32*   BNP (last 3 results) No results for input(s): PROBNP in the last 8760 hours. HbA1C: No results for input(s): HGBA1C in the last 72 hours. CBG: Recent Labs  Lab 09/23/20 1656 09/23/20 1907 09/23/20 2018 09/24/20 0800 09/24/20 1151  GLUCAP 141* 150* 167* 160* 134*   Lipid Profile:  No results for input(s): CHOL, HDL, LDLCALC, TRIG, CHOLHDL, LDLDIRECT in the last 72 hours. Thyroid Function Tests: No results for input(s): TSH, T4TOTAL, FREET4, T3FREE, THYROIDAB in the last 72 hours. Anemia Panel: No results for input(s): VITAMINB12, FOLATE, FERRITIN, TIBC, IRON, RETICCTPCT in the last 72 hours. Sepsis Labs: Recent Labs  Lab 09/17/20 2314 09/18/20 0929 09/23/20 2224 09/24/20 0111  LATICACIDVEN 3.1* 1.8 1.3 0.8    Recent Results (from the past 240 hour(s))  Blood Culture (routine x 2)     Status: Abnormal   Collection Time: 09/17/20 11:14 PM   Specimen: BLOOD  Result Value Ref Range Status   Specimen Description   Final    BLOOD RIGHT ANTECUBITAL Performed at [facility] [address] [place], [place] [address]    Special Requests   Final    BOTTLES DRAWN AEROBIC AND ANAEROBIC Blood Culture adequate volume Performed at [facility] [address], [place], [place] [address]    Culture  Setup Time   Final    GRAM NEGATIVE RODS IN BOTH AEROBIC AND ANAEROBIC BOTTLES Organism ID to follow CRITICAL RESULT CALLED TO, READ BACK BY AND VERIFIED WITHKeturah Barre [facility] PHARMD 1619 09/17/20 A BROWNING Performed at [facility], [place] [address], [place], [place] [address]    Culture ESCHERICHIA COLI (A)  Final   Report Status 09/20/2020 FINAL  Final   Organism ID, Bacteria ESCHERICHIA COLI  Final      Susceptibility   Escherichia coli - MIC*    AMPICILLIN 4 SENSITIVE Sensitive     CEFAZOLIN <=4 SENSITIVE Sensitive     CEFEPIME <=0.12 SENSITIVE Sensitive     CEFTAZIDIME <=1 SENSITIVE Sensitive      CEFTRIAXONE <=0.25 SENSITIVE Sensitive     CIPROFLOXACIN <=0.25 SENSITIVE Sensitive     GENTAMICIN <=1 SENSITIVE Sensitive     IMIPENEM <=0.25 SENSITIVE Sensitive     TRIMETH/SULFA <=20 SENSITIVE Sensitive     AMPICILLIN/SULBACTAM <=2 SENSITIVE Sensitive     PIP/TAZO <=4 SENSITIVE Sensitive     * ESCHERICHIA COLI  Blood Culture (routine x 2)     Status: Abnormal   Collection Time: 09/17/20 11:14 PM   Specimen: BLOOD  Result Value Ref Range Status   Specimen Description   Final    BLOOD LEFT ANTECUBITAL Performed at [facility] [address] [place], [place] [address]    Special Requests   Final    BOTTLES DRAWN AEROBIC AND ANAEROBIC Blood Culture adequate volume Performed at [facility] [address], [place], [place] [address]    Culture  Setup Time   Final    GRAM NEGATIVE RODS IN BOTH AEROBIC AND ANAEROBIC BOTTLES CRITICAL VALUE NOTED.  VALUE IS CONSISTENT WITH PREVIOUSLY REPORTED AND CALLED VALUE.    Culture (A)  Final    ESCHERICHIA COLI SUSCEPTIBILITIES PERFORMED ON PREVIOUS CULTURE WITHIN THE LAST 5 DAYS. Performed at [facility], [place] [address], [place], [place] [address]    Report Status 09/20/2020 FINAL  Final  Blood Culture ID Panel (Reflexed)     Status: Abnormal   Collection Time: 09/17/20 11:14 PM  Result Value Ref Range Status   Enterococcus faecalis NOT DETECTED NOT DETECTED Final   Enterococcus Faecium NOT DETECTED NOT DETECTED Final   Listeria monocytogenes NOT DETECTED NOT DETECTED Final   Staphylococcus species NOT DETECTED NOT DETECTED Final   Staphylococcus aureus (BCID) NOT DETECTED NOT DETECTED Final   Staphylococcus epidermidis NOT DETECTED  NOT DETECTED Final   Staphylococcus lugdunensis NOT DETECTED NOT DETECTED Final   Streptococcus species NOT DETECTED NOT DETECTED Final   Streptococcus agalactiae NOT DETECTED NOT DETECTED Final   Streptococcus pneumoniae NOT DETECTED NOT DETECTED Final    Streptococcus pyogenes NOT DETECTED NOT DETECTED Final   A.calcoaceticus-baumannii NOT DETECTED NOT DETECTED Final   Bacteroides fragilis NOT DETECTED NOT DETECTED Final   Enterobacterales DETECTED (A) NOT DETECTED Final    Comment: Enterobacterales represent a large order of gram negative bacteria, not a single organism. CRITICAL RESULT CALLED TO, READ BACK BY AND VERIFIED WITH: D [facility] PHARMD 1619 09/17/20 A BROWNING    Enterobacter cloacae complex NOT DETECTED NOT DETECTED Final   Escherichia coli DETECTED (A) NOT DETECTED Final    Comment: CRITICAL RESULT CALLED TO, READ BACK BY AND VERIFIED WITH: D [facility] PHARMD 1619 09/17/20 A BROWNING    Klebsiella aerogenes NOT DETECTED NOT DETECTED Final   Klebsiella oxytoca NOT DETECTED NOT DETECTED Final   Klebsiella pneumoniae NOT DETECTED NOT DETECTED Final   Proteus species NOT DETECTED NOT DETECTED Final   Salmonella species NOT DETECTED NOT DETECTED Final   Serratia marcescens NOT DETECTED NOT DETECTED Final   Haemophilus influenzae NOT DETECTED NOT DETECTED Final   Neisseria meningitidis NOT DETECTED NOT DETECTED Final   Pseudomonas aeruginosa NOT DETECTED NOT DETECTED Final   Stenotrophomonas maltophilia NOT DETECTED NOT DETECTED Final   Candida albicans NOT DETECTED NOT DETECTED Final   Candida auris NOT DETECTED NOT DETECTED Final   Candida glabrata NOT DETECTED NOT DETECTED Final   Candida krusei NOT DETECTED NOT DETECTED Final   Candida parapsilosis NOT DETECTED NOT DETECTED Final   Candida tropicalis NOT DETECTED NOT DETECTED Final   Cryptococcus neoformans/gattii NOT DETECTED NOT DETECTED Final   CTX-M ESBL NOT DETECTED NOT DETECTED Final   Carbapenem resistance IMP NOT DETECTED NOT DETECTED Final   Carbapenem resistance KPC NOT DETECTED NOT DETECTED Final   Carbapenem resistance NDM NOT DETECTED NOT DETECTED Final   Carbapenem resist OXA 48 LIKE NOT DETECTED NOT DETECTED Final   Carbapenem resistance VIM NOT DETECTED NOT  DETECTED Final    Comment: Performed at [facility], [address] [place], [place] [address]  Resp Panel by RT-PCR (Flu A&B, Covid) Nasopharyngeal Swab     Status: None   Collection Time: 09/17/20 11:37 PM   Specimen: Nasopharyngeal Swab; Nasopharyngeal(NP) swabs in vial transport medium  Result Value Ref Range Status   SARS Coronavirus 2 by RT PCR NEGATIVE NEGATIVE Final    Comment: (NOTE) SARS-CoV-2 target nucleic acids are NOT DETECTED.  The SARS-CoV-2 RNA is generally detectable in upper respiratory specimens during the acute phase of infection. The lowest concentration of SARS-CoV-2 viral copies this assay can detect is 138 copies/mL. A negative result does not preclude SARS-Cov-2 infection and should not be used as the sole basis for treatment or other patient management decisions. A negative result may occur with  improper specimen collection/handling, submission of specimen other than nasopharyngeal swab, presence of viral mutation(s) within the areas targeted by this assay, and inadequate number of viral copies(<138 copies/mL). A negative result must be combined with clinical observations, patient history, and epidemiological information. The expected result is Negative.  Fact Sheet for Patients:  EntrepreneurPulse.com.au  Fact Sheet for Healthcare Providers:  IncredibleEmployment.be  This test is no t yet approved or cleared by the [place] FDA and  has been authorized for detection and/or diagnosis of SARS-CoV-2 by FDA under an Emergency  Use Authorization (EUA). This EUA will remain  in effect (meaning this test can be used) for the duration of the COVID-19 declaration under Section 564(b)(1) of the Act, 21 U.S.C.section 360bbb-3(b)(1), unless the authorization is terminated  or revoked sooner.       Influenza A by PCR NEGATIVE NEGATIVE Final   Influenza B by PCR NEGATIVE NEGATIVE Final    Comment: (NOTE) The Xpert  Xpress SARS-CoV-2/FLU/RSV plus assay is intended as an aid in the diagnosis of influenza from Nasopharyngeal swab specimens and should not be used as a sole basis for treatment. Nasal washings and aspirates are unacceptable for Xpert Xpress SARS-CoV-2/FLU/RSV testing.  Fact Sheet for Patients: EntrepreneurPulse.com.au  Fact Sheet for Healthcare Providers: IncredibleEmployment.be  This test is not yet approved or cleared by the [place] FDA and has been authorized for detection and/or diagnosis of SARS-CoV-2 by FDA under an Emergency Use Authorization (EUA). This EUA will remain in effect (meaning this test can be used) for the duration of the COVID-19 declaration under Section 564(b)(1) of the Act, 21 U.S.C. section 360bbb-3(b)(1), unless the authorization is terminated or revoked.  Performed at [facility], [place] [address], [place], [place] [address]   Urine culture     Status: Abnormal   Collection Time: 09/18/20 12:40 AM   Specimen: In/Out Cath Urine  Result Value Ref Range Status   Specimen Description   Final    IN/OUT CATH URINE Performed at [facility] [address], [place], [place] [address]    Special Requests   Final    NONE Performed at [facility], [place] [address] [place], [place] [address]    Culture >=100,000 COLONIES/mL ESCHERICHIA COLI (A)  Final   Report Status 09/20/2020 FINAL  Final   Organism ID, Bacteria ESCHERICHIA COLI (A)  Final      Susceptibility   Escherichia coli - MIC*    AMPICILLIN 4 SENSITIVE Sensitive     CEFAZOLIN <=4 SENSITIVE Sensitive     CEFEPIME <=0.12 SENSITIVE Sensitive     CEFTRIAXONE <=0.25 SENSITIVE Sensitive     CIPROFLOXACIN <=0.25 SENSITIVE Sensitive     GENTAMICIN <=1 SENSITIVE Sensitive     IMIPENEM <=0.25 SENSITIVE Sensitive     NITROFURANTOIN <=16 SENSITIVE Sensitive     TRIMETH/SULFA <=20 SENSITIVE Sensitive      AMPICILLIN/SULBACTAM <=2 SENSITIVE Sensitive     PIP/TAZO <=4 SENSITIVE Sensitive     * >=100,000 COLONIES/mL ESCHERICHIA COLI  MRSA PCR Screening     Status: None   Collection Time: 09/18/20  1:40 PM   Specimen: Nasopharyngeal  Result Value Ref Range Status   MRSA by PCR NEGATIVE NEGATIVE Final    Comment:        The GeneXpert MRSA Assay (FDA approved for NASAL specimens only), is one component of a comprehensive MRSA colonization surveillance program. It is not intended to diagnose MRSA infection nor to guide or monitor treatment for MRSA infections. Performed at [facility], [place] [address] [place], [place] [address]      Radiology Studies: ECHOCARDIOGRAM COMPLETE  Result Date: 09/24/2020    ECHOCARDIOGRAM REPORT   Patient Name:   DIDIER BRANDENBURG Date of Exam: 09/24/2020 Medical Rec #:  [id number]    Height:       72.0 in Accession #:    [id number]   Weight:       211.9 lb Date of Birth:  [birth year]     BSA:  2.183 m Patient Age:    35 years     BP:           101/56 mmHg Patient Gender: M            HR:           104 bpm. Exam Location:  Inpatient Procedure: 2D Echo, Cardiac Doppler and Color Doppler Indications:    R07.9* Chest pain, unspecified. Elevated troponin.  History:        Patient has no prior history of Echocardiogram examinations.                 Signs/Symptoms:Chest Pain and Bacteremia; Risk                 Factors:Hypertension. Elevated troponin.  Sonographer:    Roseanna Rainbow RDCS Referring Phys: Pierz  Sonographer Comments: Technically difficult study due to poor echo windows. IMPRESSIONS  1. Left ventricular ejection fraction, by estimation, is 65 to 70%. The left ventricle has normal function. The left ventricle has no regional wall motion abnormalities. There is severe concentric left ventricular hypertrophy. Left ventricular diastolic  parameters are consistent with Grade I diastolic dysfunction (impaired relaxation).  2. Right  ventricular systolic function is normal. The right ventricular size is mildly enlarged. There is mildly elevated pulmonary artery systolic pressure. The estimated right ventricular systolic pressure is 00.9 mmHg.  3. Left atrial size was moderately dilated.  4. Trivial to small pericardial effusion. The pericardial effusion is circumferential.  5. The mitral valve is grossly normal. Trivial mitral valve regurgitation.  6. The aortic valve is tricuspid. Aortic valve regurgitation is not visualized. Mild aortic valve sclerosis is present, with no evidence of aortic valve stenosis.  7. The inferior vena cava is dilated in size with >50% respiratory variability, suggesting right atrial pressure of 8 mmHg. Comparison(s): No prior Echocardiogram. FINDINGS  Left Ventricle: Left ventricular ejection fraction, by estimation, is 65 to 70%. The left ventricle has normal function. The left ventricle has no regional wall motion abnormalities. The left ventricular internal cavity size was small. There is severe concentric left ventricular hypertrophy. Left ventricular diastolic parameters are consistent with Grade I diastolic dysfunction (impaired relaxation). Normal left ventricular filling pressure. Right Ventricle: The right ventricular size is mildly enlarged. No increase in right ventricular wall thickness. Right ventricular systolic function is normal. There is mildly elevated pulmonary artery systolic pressure. The tricuspid regurgitant velocity is 2.95 m/s, and with an assumed right atrial pressure of 8 mmHg, the estimated right ventricular systolic pressure is 38.1 mmHg. Left Atrium: Left atrial size was moderately dilated. Right Atrium: Right atrial size was normal in size. Pericardium: Trivial pericardial effusion is present. The pericardial effusion is circumferential. Mitral Valve: The mitral valve is grossly normal. Trivial mitral valve regurgitation. Tricuspid Valve: The tricuspid valve is grossly normal. Tricuspid  valve regurgitation is mild. Aortic Valve: The aortic valve is tricuspid. There is mild aortic valve annular calcification. Aortic valve regurgitation is not visualized. Mild aortic valve sclerosis is present, with no evidence of aortic valve stenosis. Pulmonic Valve: The pulmonic valve was normal in structure. Pulmonic valve regurgitation is trivial. Aorta: The aortic root and ascending aorta are structurally normal, with no evidence of dilitation. Venous: The inferior vena cava is dilated in size with greater than 50% respiratory variability, suggesting right atrial pressure of 8 mmHg. IAS/Shunts: No atrial level shunt detected by color flow Doppler.  LEFT VENTRICLE PLAX 2D LVIDd:  3.50 cm     Diastology LVIDs:         2.30 cm     LV e' medial:    8.70 cm/s LV PW:         2.10 cm     LV E/e' medial:  6.1 LV IVS:        1.70 cm     LV e' lateral:   9.46 cm/s LVOT diam:     2.50 cm     LV E/e' lateral: 5.6 LV SV:         94 LV SV Index:   43 LVOT Area:     4.91 cm  LV Volumes (MOD) LV vol d, MOD A2C: 60.3 ml LV vol d, MOD A4C: 64.5 ml LV vol s, MOD A2C: 14.2 ml LV vol s, MOD A4C: 16.6 ml LV SV MOD A2C:     46.1 ml LV SV MOD A4C:     64.5 ml LV SV MOD BP:      50.2 ml RIGHT VENTRICLE             IVC RV S prime:     12.80 cm/s  IVC diam: 2.60 cm TAPSE (M-mode): 1.8 cm LEFT ATRIUM           Index      RIGHT ATRIUM           Index LA diam:      3.70 cm 1.69 cm/m RA Area:     25.50 cm LA Vol (A2C): 18.3 ml 8.38 ml/m RA Volume:   84.40 ml  38.66 ml/m LA Vol (A4C): 12.6 ml 5.77 ml/m  AORTIC VALVE LVOT Vmax:   127.00 cm/s LVOT Vmean:  96.000 cm/s LVOT VTI:    0.191 m  AORTA Ao Root diam: 3.70 cm MITRAL VALVE               TRICUSPID VALVE MV Area (PHT): 4.89 cm    TR Peak grad:   34.8 mmHg MV Decel Time: 155 msec    TR Vmax:        295.00 cm/s MV E velocity: 53.10 cm/s MV A velocity: 81.80 cm/s  SHUNTS MV E/A ratio:  0.65        Systemic VTI:  0.19 m                            Systemic Diam: 2.50 cm Lyman Bishop MD Electronically signed by Lyman Bishop MD Signature Date/Time: 09/24/2020/12:42:56 PM    Final    VAS [place] LOWER EXTREMITY VENOUS (DVT)  Result Date: 09/23/2020  Lower Venous DVT Study Indications: Swelling.  Risk Factors: None identified. Limitations: Poor ultrasound/tissue interface. Comparison Study: No prior studies. Performing Technologist: Oliver Hum RVT  Examination Guidelines: A complete evaluation includes B-mode imaging, spectral Doppler, color Doppler, and power Doppler as needed of all accessible portions of each vessel. Bilateral testing is considered an integral part of a complete examination. Limited examinations for reoccurring indications may be performed as noted. The reflux portion of the exam is performed with the patient in reverse Trendelenburg.  +---------+---------------+---------+-----------+----------+--------------+ RIGHT    CompressibilityPhasicitySpontaneityPropertiesThrombus Aging +---------+---------------+---------+-----------+----------+--------------+ CFV      Partial        No       No                   Acute          +---------+---------------+---------+-----------+----------+--------------+  SFJ      Full                                                        +---------+---------------+---------+-----------+----------+--------------+ FV Prox  Partial        No       No                   Acute          +---------+---------------+---------+-----------+----------+--------------+ FV Mid   None           No       No                   Acute          +---------+---------------+---------+-----------+----------+--------------+ FV DistalNone           No       No                   Acute          +---------+---------------+---------+-----------+----------+--------------+ PFV      Full                                                        +---------+---------------+---------+-----------+----------+--------------+ POP       None           No       No                   Acute          +---------+---------------+---------+-----------+----------+--------------+ PTV      Partial                                      Acute          +---------+---------------+---------+-----------+----------+--------------+ PERO     None                                         Acute          +---------+---------------+---------+-----------+----------+--------------+ Gastroc  Full                                                        +---------+---------------+---------+-----------+----------+--------------+ EIV      Partial        No       No                   Acute          +---------+---------------+---------+-----------+----------+--------------+ CIV                     Yes      Yes                                 +---------+---------------+---------+-----------+----------+--------------+   +----+---------------+---------+-----------+----------+--------------+  LEFTCompressibilityPhasicitySpontaneityPropertiesThrombus Aging +----+---------------+---------+-----------+----------+--------------+ CFV Full           Yes      Yes                                 +----+---------------+---------+-----------+----------+--------------+     Summary: RIGHT: - Findings consistent with acute deep vein thrombosis involving the right external iliac vein, common femoral vein, right femoral vein, right popliteal vein, right posterior tibial veins, and right peroneal veins. - No cystic structure found in the popliteal fossa.   *See table(s) above for measurements and observations. Electronically signed by Deitra Mayo MD on 09/23/2020 at 4:35:04 PM.    Final     Scheduled Meds: . Chlorhexidine Gluconate Cloth  6 each Topical Daily  . insulin aspart  0-15 Units Subcutaneous TID WC  . insulin aspart  0-5 Units Subcutaneous QHS   Continuous Infusions: . sodium chloride    . sodium chloride Stopped (09/23/20  2024)  . cefTRIAXone (ROCEPHIN)  IV Stopped (09/23/20 2314)  . dextrose 100 mL/hr at 09/24/20 0811  . heparin 1,650 Units/hr (09/24/20 0811)  . phenylephrine (NEO-SYNEPHRINE) Adult infusion 65 mcg/min (09/24/20 0906)     LOS: 6 days   Marylu Lund, MD [facility] Pager On Amion  If 7PM-7AM, please contact night-coverage 09/24/2020, 1:10 PM

## 2020-09-24 NOTE — Progress Notes (Signed)
Called by RR nurse, pt with new onset decreased level of consciousness and hypotension.  No improvement with bolus of 565mL.  Pt transferred to ICU for further care.  Discussed situation and plan with niece and she agreed.  Labs ordered, EKG showed sinus tachycardia.  Albumin given after bolus with no significant improvement, hypotension worsened, Neo started for blood pressure control.  Patient became less lethargic and began answering questions.  Neo continued and lab results pending.

## 2020-09-24 NOTE — Progress Notes (Signed)
NAME:  Shivam Mestas, MRN:  [id number], DOB:  23-Apr-1946, LOS: 6 ADMISSION DATE:  09/17/2020, CONSULTATION DATE:  12/15 REFERRING MD:  Roxanne Mins, CHIEF COMPLAINT:  Found down   Brief History    74 y/o male brought to the ED on 12/14 after she was not answering her neighbor's calls, noted to be in septic shock due to pyelonephritis.    Past Medical History  Hypertension Acute gastric ulcer with hemorrhage  Significant Hospital Events    12/14 admitted. treated for septic shock UT source. Broad spec abx started.  12/15 had right ureteral stent placed. Weaned off pressors  12/16 Cultures positive for e-coli, hemodynamically stable. Amiodarone stopped, in SR.  12/17 To [facility]  12/21 PCCM called back for hypotension, ? Melena, tmax 100.6 / WBC 12.3, cortisol 13 12/22 Neo down to 40 mcg, stress dose steroids started, pending EGD, heparin on hold.  12/23 EGD: One non-bleeding gastric ulcer on lesser curvature with pigmented raised material.Treated with bipolar cautery. - Three non-bleeding gastric antral ulcers with no stigmata of bleeding. - Severe erosive gastropathy with no bleeding and no stigmata of recent bleeding. Biopsied. - Duodenal erosions without bleeding. Recommendations: Clear liquid diet for now. - Continue present medications. - IV pantoprazole infusion for 3 days then pantoprazole 40 mg IV or PO bid. - No aspirin, ibuprofen, naproxen, or other non-steroidal anti-inflammatory drugs long term. - Await pathology results. - Resume heparin at prior dose 12/24 per primary service as indicated.  Consults:  urology  Procedures:    Significant Diagnostic Tests:  12/15 ct abdomen> 3 obstructing right proximal ureteral calculi causing moderate right hydronephrosis, pancolonic moderate diverticulosis  Micro Data:  12/14 sars cov2/flu > negative 12/14 blood culture >ecoli 12/15 UC > 100K ecoli (pan sens)  Antimicrobials:  12/14 cefepime > 12/15 Ancef 12/25>>>  Interim  history/subjective:    Ambulated today.  Feeling well. Objective   Blood pressure (Abnormal) 115/59, pulse (Abnormal) 106, temperature (Abnormal) 100.6 F (38.1 C), temperature source Axillary, resp. rate 20, height 6' (1.829 m), weight 96.1 kg, SpO2 95 %.        Intake/Output Summary (Last 24 hours) at 09/24/2020 1242 Last data filed at 09/24/2020 3474 Gross per 24 hour  Intake 3310.22 ml  Output 1 ml  Net 3309.22 ml   Filed Weights   09/18/20 1103 09/18/20 1400 09/23/20 0451  Weight: 103.9 kg 94.8 kg 96.1 kg    Examination: General: 74 year old male patient currently sitting up in chair he did just finished walking in the ICU HEENT normocephalic atraumatic no jugular venous distention Pulmonary: Clear to auscultation 2 lpm [place] Cardiac: Regular rate and rhythm Abdomen: Soft nontender Strength: Warm dry Neuro: Awake oriented no focal deficits GU: Voids. Resolved Hospital Problem list   Septic shock   Assessment & Plan:  Recurrent hypotension/circulatory shock; likely mix of acute UGIB and relative adrenal insuff superimposed on treated Ecoli bacteremia  In setting of pyelonephritis w/ obstructive uropathy/hydro 2/2 nephrolithiasis  s/p stent 12/15 Likely c/b element of adrenal insuff.  Plan Changed to ancef 12/22; today is day # 9 of 14 antibiotics (per recs from urology) Will begin to taper stress dose steroids F/u urology  UGIB w/ associated blood loss anemia.  S/p EGD 12/23: non-bleeding gastric ulcer on lesser curvature with pigmented raised material.Treated with bipolar cautery. Three non-bleeding gastric antral ulcers with no stigmata of bleeding.Severe erosive gastropathy  Plan Cont PPI BID after completes 72 hrs IV PPI No NSAIDs OK to resume heparin 12/23 but high risk  for re-bleed   Demand ischemia w/ grade I diastolic dysfxn Plan Keep euvolemic Tele  Holding any antihypertensives for now  RLE DVT Plan Resume heparin no bolus 12/24 If bleeds  again=filter   AKI  -scr normalized Plan Trend chemistry Avoid nephrotoxins  Avoid hypotension   Fluid and electrolyte imbalance: hypernatremia ->resolved Plan Dc IVFs Allow clear liq Am chemistry   Thrombocytopenia: in setting of sepsis Plan Trend cbc   Hyperglycemia: New diagnosis of diabetes (history supports) Plan ssi consider transition to oral agent closer to dc  Mod protein cal malnutrition Plan Cont supplementation and additional recs per RD Best practice (evaluated daily)   Diet: Clear liquids Pain/Anxiety/Delirium protocol (if indicated): n/a VAP protocol (if indicated): n/a DVT prophylaxis: Resume IV heparin 12/24 GI prophylaxis: PPI infusion, x72 hours started 12/23 Glucose control: as above Mobility: up ad lib last date of multidisciplinary goals of care discussion: hasn't happened yet Family and staff present   Summary of discussion hasn't happened yet Follow up goals of care discussion completed Code Status: full Disposition: stepdown, [facility] assume care.   Erick Colace ACNP-BC [place] Pager # [phone] OR # [phone] if no answer

## 2020-09-25 DIAGNOSIS — E2740 Unspecified adrenocortical insufficiency: Secondary | ICD-10-CM

## 2020-09-25 DIAGNOSIS — R6510 Systemic inflammatory response syndrome (SIRS) of non-infectious origin without acute organ dysfunction: Secondary | ICD-10-CM | POA: Diagnosis not present

## 2020-09-25 DIAGNOSIS — D649 Anemia, unspecified: Secondary | ICD-10-CM

## 2020-09-25 DIAGNOSIS — K921 Melena: Secondary | ICD-10-CM

## 2020-09-25 DIAGNOSIS — N12 Tubulo-interstitial nephritis, not specified as acute or chronic: Secondary | ICD-10-CM | POA: Diagnosis not present

## 2020-09-25 DIAGNOSIS — N139 Obstructive and reflux uropathy, unspecified: Secondary | ICD-10-CM | POA: Diagnosis not present

## 2020-09-25 DIAGNOSIS — D62 Acute posthemorrhagic anemia: Secondary | ICD-10-CM

## 2020-09-25 DIAGNOSIS — Z7901 Long term (current) use of anticoagulants: Secondary | ICD-10-CM

## 2020-09-25 LAB — FOLATE: Folate: 11.6 ng/mL

## 2020-09-25 LAB — CBC WITH DIFFERENTIAL/PLATELET
Abs Immature Granulocytes: 0.09 10*3/uL — ABNORMAL HIGH (ref 0.00–0.07)
Basophils Absolute: 0.1 10*3/uL (ref 0.0–0.1)
Basophils Relative: 1 %
Eosinophils Absolute: 0.1 10*3/uL (ref 0.0–0.5)
Eosinophils Relative: 1 %
HCT: 24.9 % — ABNORMAL LOW (ref 39.0–52.0)
Hemoglobin: 7.5 g/dL — ABNORMAL LOW (ref 13.0–17.0)
Immature Granulocytes: 1 %
Lymphocytes Relative: 20 %
Lymphs Abs: 2 10*3/uL (ref 0.7–4.0)
MCH: 26.8 pg (ref 26.0–34.0)
MCHC: 30.1 g/dL (ref 30.0–36.0)
MCV: 88.9 fL (ref 80.0–100.0)
Monocytes Absolute: 0.5 10*3/uL (ref 0.1–1.0)
Monocytes Relative: 5 %
Neutro Abs: 7.6 10*3/uL (ref 1.7–7.7)
Neutrophils Relative %: 72 %
Platelets: 132 10*3/uL — ABNORMAL LOW (ref 150–400)
RBC: 2.8 MIL/uL — ABNORMAL LOW (ref 4.22–5.81)
RDW: 15.2 % (ref 11.5–15.5)
WBC: 10.4 10*3/uL (ref 4.0–10.5)
nRBC: 0 % (ref 0.0–0.2)

## 2020-09-25 LAB — COMPREHENSIVE METABOLIC PANEL
ALT: 32 U/L (ref 0–44)
AST: 26 U/L (ref 15–41)
Albumin: 2.2 g/dL — ABNORMAL LOW (ref 3.5–5.0)
Alkaline Phosphatase: 42 U/L (ref 38–126)
Anion gap: 10 (ref 5–15)
BUN: 18 mg/dL (ref 8–23)
CO2: 20 mmol/L — ABNORMAL LOW (ref 22–32)
Calcium: 8 mg/dL — ABNORMAL LOW (ref 8.9–10.3)
Chloride: 114 mmol/L — ABNORMAL HIGH (ref 98–111)
Creatinine, Ser: 1.11 mg/dL (ref 0.61–1.24)
GFR, Estimated: >60 mL/min (ref >60)
Glucose, Bld: 179 mg/dL — ABNORMAL HIGH (ref 70–99)
Potassium: 3.6 mmol/L (ref 3.5–5.1)
Sodium: 144 mmol/L (ref 135–145)
Total Bilirubin: 0.5 mg/dL (ref 0.3–1.2)
Total Protein: 4.9 g/dL — ABNORMAL LOW (ref 6.5–8.1)

## 2020-09-25 LAB — IRON AND TIBC
Iron: 50 ug/dL (ref 45–182)
Saturation Ratios: 26 % (ref 17.9–39.5)
TIBC: 189 ug/dL — ABNORMAL LOW (ref 250–450)
UIBC: 139 ug/dL

## 2020-09-25 LAB — FERRITIN: Ferritin: 416 ng/mL — ABNORMAL HIGH (ref 24–336)

## 2020-09-25 LAB — PREPARE RBC (CROSSMATCH)

## 2020-09-25 LAB — GLUCOSE, CAPILLARY
Glucose-Capillary: 141 mg/dL — ABNORMAL HIGH (ref 70–99)
Glucose-Capillary: 117 mg/dL — ABNORMAL HIGH (ref 70–99)
Glucose-Capillary: 170 mg/dL — ABNORMAL HIGH (ref 70–99)
Glucose-Capillary: 249 mg/dL — ABNORMAL HIGH (ref 70–99)

## 2020-09-25 LAB — RETICULOCYTES
Immature Retic Fract: 31.7 % — ABNORMAL HIGH (ref 2.3–15.9)
RBC.: 2.82 MIL/uL — ABNORMAL LOW (ref 4.22–5.81)
Retic Count, Absolute: 89.7 K/uL (ref 19.0–186.0)
Retic Ct Pct: 3.2 % — ABNORMAL HIGH (ref 0.4–3.1)

## 2020-09-25 LAB — HEPARIN LEVEL (UNFRACTIONATED): Heparin Unfractionated: 0.54 [IU]/mL (ref 0.30–0.70)

## 2020-09-25 LAB — VITAMIN B12: Vitamin B-12: 1272 pg/mL — ABNORMAL HIGH (ref 180–914)

## 2020-09-25 MED ORDER — HEPARIN (PORCINE) 25000 UT/250ML-% IV SOLN: 1650.0000 [IU]/h | INTRAVENOUS | Status: DC

## 2020-09-25 MED ORDER — HEPARIN (PORCINE) 25000 UT/250ML-% IV SOLN
1650.0000 [IU]/h | INTRAVENOUS | Status: DC
  Filled 2020-09-25: qty 250

## 2020-09-25 MED ORDER — POTASSIUM CHLORIDE CRYS ER 20 MEQ PO TBCR: 40.0000 meq | EXTENDED_RELEASE_TABLET | Freq: Once | ORAL | Status: DC

## 2020-09-25 MED ORDER — PANTOPRAZOLE SODIUM 40 MG IV SOLR
40.0000 mg | Freq: Two times a day (BID) | INTRAVENOUS | Status: DC
  Administered 2020-09-25: 21:00:00 40 mg via INTRAVENOUS
  Filled 2020-09-25: qty 40

## 2020-09-25 MED ORDER — SODIUM CHLORIDE 0.9% IV SOLUTION: Freq: Once | INTRAVENOUS | Status: AC

## 2020-09-25 MED ORDER — CEFAZOLIN SODIUM-DEXTROSE 2-4 GM/100ML-% IV SOLN
2.0000 g | Freq: Three times a day (TID) | INTRAVENOUS | Status: AC
  Administered 2020-09-25 – 2020-10-01 (×20): 2 g via INTRAVENOUS
  Filled 2020-09-25 (×20): qty 100

## 2020-09-25 MED ORDER — ADULT MULTIVITAMIN W/MINERALS CH
1.0000 | ORAL_TABLET | Freq: Every day | ORAL | Status: DC
  Administered 2020-09-26 – 2020-10-02 (×7): 1 via ORAL
  Filled 2020-09-25 (×7): qty 1

## 2020-09-25 MED ORDER — THIAMINE HCL 100 MG PO TABS
100.0000 mg | ORAL_TABLET | Freq: Every day | ORAL | Status: DC
  Administered 2020-09-26 – 2020-10-02 (×7): 100 mg via ORAL
  Filled 2020-09-25 (×7): qty 1

## 2020-09-25 MED ORDER — FOLIC ACID 1 MG PO TABS
1.0000 mg | ORAL_TABLET | Freq: Every day | ORAL | Status: DC
  Administered 2020-09-26 – 2020-10-02 (×7): 1 mg via ORAL
  Filled 2020-09-25 (×7): qty 1

## 2020-09-25 MED ORDER — HYDROCORTISONE NA SUCCINATE PF 100 MG IJ SOLR
50.0000 mg | Freq: Four times a day (QID) | INTRAMUSCULAR | Status: DC
  Administered 2020-09-25 – 2020-09-26 (×5): 50 mg via INTRAVENOUS
  Filled 2020-09-25 (×5): qty 2

## 2020-09-25 NOTE — Consult Note (Addendum)
Referring Provider:  Noe Gens, NP, PCCM Primary Care Physician:  Elizabeth Palau, MD (Inactive) Primary Gastroenterologist:  Dr. Deatra Ina  Reason for Consultation:  Decreasing Hgb, black stools  HPI: Edward Wells is a 74 y.o. male with limited PMH.  He presented to the hospital on 12/15 via EMS and was found to in septic shock/Ecoli bacteremia due to pyelonephritis due to obstructing renal stones.  Also had afib with RVR.  Improved and was transferred to hospitalist service.  Then on 12/21 he had aMS and hypotension.  Is back in ICU on low dose neo gtt.  Hgb has been trending down entire hospitalization from initial Hgb of 13.8 grams to 7.5 grams this AM.  Yesterday he had two black stools that were hemoccult positive so GI was consulted.  Today he looks good.  Family at bedside.  Laughing and joking.  No further sign of bleeding overnight or today.  Some mild intermittent lower abdominal pain (? GU related).  He says that at home he was moving his bowels well without sign of bleeding.  He denies NSAID use at home.  No heartburn/reflux.  EGD 11/2014 showed gastritis--+ Hpylori  EGD 07/2014 showed gastric ulcer--biopsies showed intestinal metaplasia, negative for Hpylori.  Colonoscopy 07/2012 showed hemorrhoids, diverticulosis, and a 13 mm sessile polyp--tubular adenoma   Past Medical History:  Diagnosis Date  . Acute gastric ulcer with hemorrhage 07/30/2014  . Hypertension     Past Surgical History:  Procedure Laterality Date  . COLONOSCOPY    . CYSTOSCOPY W/ URETERAL STENT PLACEMENT Right 09/18/2020   Procedure: CYSTOSCOPY WITH RETROGRADE PYELOGRAM/URETERAL STENT PLACEMENT;  Surgeon: Raynelle Bring, MD;  Location: [facility] ORS;  Service: Urology;  Laterality: Right;  . ESOPHAGOGASTRODUODENOSCOPY N/A 07/31/2014   Procedure: ESOPHAGOGASTRODUODENOSCOPY (EGD);  Surgeon: Gatha Mayer, MD;  Location: [facility] ENDOSCOPY;  Service: Endoscopy;  Laterality: N/A;  . ganglion cyst wrist  1971    right, x 2.  . HAMMER TOE SURGERY  1984   bilateral  . KNEE ARTHROSCOPY     left, 2004; right, 2001    Prior to Admission medications   Medication Sig Start Date End Date Taking? Authorizing Provider  acetaminophen (TYLENOL) 325 MG tablet Take 975 mg by mouth daily as needed for mild pain.   Yes [provider]  Coenzyme Q10 (CO Q 10) 100 MG CAPS Take 100 mg by mouth daily.   Yes [provider]  losartan (COZAAR) 50 MG tablet Take 50 mg by mouth daily. 01/18/20  Yes [provider]  Multiple Vitamin (MULTIVITAMIN) tablet Take 1 tablet by mouth daily.   Yes [provider]  tiZANidine (ZANAFLEX) 2 MG tablet Take 1 tablet by mouth 3 (three) times daily as needed for muscle spasms. 02/15/20  Yes [provider]  cefdinir (OMNICEF) 300 MG capsule Take 1 capsule (300 mg total) by mouth 2 (two) times daily. Patient not taking: Reported on 09/18/2020 03/11/20   Orpah Greek, MD    Current Facility-Administered Medications  Medication Dose Route Frequency Provider Last Rate Last Admin  . 0.9 %  sodium chloride infusion  250 mL Intravenous Continuous Polly Cobia, RPH 10 mL/hr at 09/25/20 0402 Infusion Verify at 09/25/20 0402  . cefTRIAXone (ROCEPHIN) 2 g in sodium chloride 0.9 % 100 mL IVPB  2 g Intravenous Q24H Donne Hazel, MD   Stopped at 09/24/20 2330  . Chlorhexidine Gluconate Cloth 2 % PADS 6 each  6 each Topical Daily Donne Hazel, MD  6 each at 09/24/20 0630  . dextrose 5 % solution   Intravenous Continuous Noe Gens L, NP 100 mL/hr at 09/25/20 0752 New Bag at 09/25/20 0752  . feeding supplement (GLUCERNA SHAKE) (GLUCERNA SHAKE) liquid 237 mL  237 mL Oral TID BM Ollis, Brandi L, NP      . folic acid (FOLVITE) tablet 1 mg  1 mg Oral Daily Ollis, Brandi L, NP      . heparin ADULT infusion 100 units/mL (25000 units/239mL sodium chloride 0.45%)  1,650 Units/hr Intravenous Continuous Thomes Lolling, [facility]   Stopped at 09/25/20  [id number]  . hydrocortisone sodium succinate (SOLU-CORTEF) 100 MG injection 50 mg  50 mg Intravenous Q6H Ollis, Brandi L, NP      . HYDROmorphone (DILAUDID) injection 0.5 mg  0.5 mg Intravenous Q2H PRN Donne Hazel, MD   0.5 mg at 09/23/20 2028  . insulin aspart (novoLOG) injection 0-15 Units  0-15 Units Subcutaneous TID WC Donne Hazel, MD   2 Units at 09/25/20 0750  . insulin aspart (novoLOG) injection 0-5 Units  0-5 Units Subcutaneous QHS Donne Hazel, MD      . multivitamin with minerals tablet 1 tablet  1 tablet Oral Daily Ollis, Brandi L, NP      . ondansetron (ZOFRAN) injection 4 mg  4 mg Intravenous Q6H PRN Donne Hazel, MD   4 mg at 09/24/20 2124  . pantoprazole (PROTONIX) EC tablet 40 mg  40 mg Oral BID AC Ollis, Brandi L, NP   40 mg at 09/25/20 0751  . phenylephrine (NEOSYNEPHRINE) 10-0.9 MG/250ML-% infusion  25-200 mcg/min Intravenous Titrated Polly Cobia, RPH 52.5 mL/hr at 09/25/20 0402 35 mcg/min at 09/25/20 0402  . potassium chloride SA (KLOR-CON) CR tablet 40 mEq  40 mEq Oral Once Ollis, Brandi L, NP      . thiamine tablet 100 mg  100 mg Oral Daily Noe Gens L, NP        Allergies as of 09/17/2020 - Review Complete 03/11/2020  Allergen Reaction Noted  . Furosemide  07/30/2014    Family History  Problem Relation Age of Onset  . Hypertension Mother   . Colon cancer Neg Hx   . Stomach cancer Neg Hx   . Esophageal cancer Neg Hx   . Rectal cancer Neg Hx     Social History   Socioeconomic History  . Marital status: Divorced    Spouse name: Not on file  . Number of children: Not on file  . Years of education: Not on file  . Highest education level: Not on file  Occupational History  . Occupation: retired  Tobacco Use  . Smoking status: Former Smoker    Start date: 10/05/2018  . Smokeless tobacco: Never Used  Substance and Sexual Activity  . Alcohol use: Not Currently    Comment: h/o heavy use; last drank 10/04/2018  . Drug use: No  . Sexual activity:  Not on file  Other Topics Concern  . Not on file  Social History Narrative   Retired Set designer.     Moved to [place] ~ 2007.       Would drink 750 ml vodka over the course of 2 weeks.  Quit etoh and cigs September 2015.    Social Determinants of Health   Financial Resource Strain: Not on file  Food Insecurity: Not on file  Transportation Needs: Not on file  Physical Activity: Not on file  Stress: Not on file  Social  Connections: Not on file  Intimate Partner Violence: Not on file    Review of Systems: ROS is O/W negative except as mentioned in HPI.  Physical Exam: Vital signs in last 24 hours: Temp:  [97.6 F (36.4 C)-100.6 F (38.1 C)] 98 F (36.7 C) (12/22 0745) Pulse Rate:  [84-114] 90 (12/22 0800) Resp:  [13-30] 16 (12/22 0800) BP: (75-160)/(38-77) 111/55 (12/22 0800) SpO2:  [90 %-100 %] 92 % (12/22 0800) Last BM Date: 09/24/20 General:  Alert, Well-developed, well-nourished, pleasant and cooperative in NAD Head:  Normocephalic and atraumatic. Eyes:  Sclera clear, no icterus.  Conjunctiva pink. Ears:  Normal auditory acuity. Mouth:  No deformity or lesions.   Lungs:  Clear throughout to auscultation.  No wheezes, crackles, or rhonchi.  Heart:  Regular rate and rhythm; no murmurs, clicks, rubs, or gallops. Abdomen:  Soft, non-distended.  BS present.  Non-tender.   Msk:  Symmetrical without gross deformities. Pulses:  Normal pulses noted. Extremities:  Without clubbing or edema. Neurologic:  Alert and oriented x 4;  grossly normal neurologically. Skin:  Intact without significant lesions or rashes. Psych:  Alert and cooperative. Normal mood and affect.  Intake/Output from previous day: 12/21 0701 - 12/22 0700 In: 6751 [P.O.:240; I.V.:6112.3; IV Piggyback:398.7] Out: 1300 [Urine:1250; Emesis/NG output:50]  Lab Results: Recent Labs    09/24/20 0111 09/24/20 1518 09/25/20 0629  WBC 12.3* 10.3 10.4  HGB 8.7* 8.3* 7.5*  HCT 28.5* 27.1* 24.9*   PLT 118* 132* 132*   BMET Recent Labs    09/24/20 0111 09/24/20 1505 09/25/20 0629  NA 146* 148* 144  K 3.8 3.6 3.6  CL 114* 116* 114*  CO2 25 26 20*  GLUCOSE 211* 156* 179*  BUN 23 30* 18  CREATININE 1.04 1.04 1.11  CALCIUM 8.1* 8.2* 8.0*   LFT Recent Labs    09/25/20 0629  PROT 4.9*  ALBUMIN 2.2*  AST 26  ALT 32  ALKPHOS 42  BILITOT 0.5   Studies/Results: US RENAL  Result Date: 09/24/2020 CLINICAL DATA:  Patient diagnosed with renal stones and right hydronephrosis by CT 09/18/2020. Double-J stent was placed 09/22/2020. EXAM: RENAL / URINARY TRACT ULTRASOUND COMPLETE COMPARISON:  CT abdomen and pelvis 09/18/2020. FINDINGS: Right Kidney: Renal measurements: 15.7 x 6.5 x 5.5 = volume: 298 mL. Cortical echogenicity is unremarkable. Two simple cysts are identified measuring 1.4 cm in the upper pole and 2.8 cm in the midpole. There is mild of the right renal pelvis without calyceal dilatation. Renal stone seen on prior CT are difficult to visualize on this examination. Double-J stent noted. Left Kidney: Renal measurements: 12.5 x 5.6 x 5.2 cm = volume: 193 mL. Cortical echogenicity is unremarkable. No hydronephrosis. Two cysts in the upper pole are identified as seen on prior CT. The larger measures 4.4 cm. Bladder: Right double-J ureteral stent is seen. Other: None. IMPRESSION: Mild fullness of the right renal pelvis without calyceal dilatation. The appearance is improved compared to the prior CT. Right double-J ureteral stent is seen at both the renal pelvis and urinary bladder. Bilateral renal cysts. Increased cortical echogenicity bilaterally suggestive of medical renal disease. Electronically Signed   By: Inge Rise M.D.   On: 09/24/2020 16:39   ECHOCARDIOGRAM COMPLETE  Result Date: 09/24/2020    ECHOCARDIOGRAM REPORT   Patient Name:   Edward Wells Date of Exam: 09/24/2020 Medical Rec #:  [id number]    Height:       72.0 in Accession #:    [id number]   Weight:  211.9  lb Date of Birth:  03-20-1946     BSA:          2.183 m Patient Age:    86 years     BP:           101/56 mmHg Patient Gender: M            HR:           104 bpm. Exam Location:  Inpatient Procedure: 2D Echo, Cardiac Doppler and Color Doppler Indications:    R07.9* Chest pain, unspecified. Elevated troponin.  History:        Patient has no prior history of Echocardiogram examinations.                 Signs/Symptoms:Chest Pain and Bacteremia; Risk                 Factors:Hypertension. Elevated troponin.  Sonographer:    Roseanna Rainbow RDCS Referring Phys: [place]  Sonographer Comments: Technically difficult study due to poor echo windows. IMPRESSIONS  1. Left ventricular ejection fraction, by estimation, is 65 to 70%. The left ventricle has normal function. The left ventricle has no regional wall motion abnormalities. There is severe concentric left ventricular hypertrophy. Left ventricular diastolic  parameters are consistent with Grade I diastolic dysfunction (impaired relaxation).  2. Right ventricular systolic function is normal. The right ventricular size is mildly enlarged. There is mildly elevated pulmonary artery systolic pressure. The estimated right ventricular systolic pressure is 0000000 mmHg.  3. Left atrial size was moderately dilated.  4. Trivial to small pericardial effusion. The pericardial effusion is circumferential.  5. The mitral valve is grossly normal. Trivial mitral valve regurgitation.  6. The aortic valve is tricuspid. Aortic valve regurgitation is not visualized. Mild aortic valve sclerosis is present, with no evidence of aortic valve stenosis.  7. The inferior vena cava is dilated in size with >50% respiratory variability, suggesting right atrial pressure of 8 mmHg. Comparison(s): No prior Echocardiogram. FINDINGS  Left Ventricle: Left ventricular ejection fraction, by estimation, is 65 to 70%. The left ventricle has normal function. The left ventricle has no regional wall motion  abnormalities. The left ventricular internal cavity size was small. There is severe concentric left ventricular hypertrophy. Left ventricular diastolic parameters are consistent with Grade I diastolic dysfunction (impaired relaxation). Normal left ventricular filling pressure. Right Ventricle: The right ventricular size is mildly enlarged. No increase in right ventricular wall thickness. Right ventricular systolic function is normal. There is mildly elevated pulmonary artery systolic pressure. The tricuspid regurgitant velocity is 2.95 m/s, and with an assumed right atrial pressure of 8 mmHg, the estimated right ventricular systolic pressure is 0000000 mmHg. Left Atrium: Left atrial size was moderately dilated. Right Atrium: Right atrial size was normal in size. Pericardium: Trivial pericardial effusion is present. The pericardial effusion is circumferential. Mitral Valve: The mitral valve is grossly normal. Trivial mitral valve regurgitation. Tricuspid Valve: The tricuspid valve is grossly normal. Tricuspid valve regurgitation is mild. Aortic Valve: The aortic valve is tricuspid. There is mild aortic valve annular calcification. Aortic valve regurgitation is not visualized. Mild aortic valve sclerosis is present, with no evidence of aortic valve stenosis. Pulmonic Valve: The pulmonic valve was normal in structure. Pulmonic valve regurgitation is trivial. Aorta: The aortic root and ascending aorta are structurally normal, with no evidence of dilitation. Venous: The inferior vena cava is dilated in size with greater than 50% respiratory variability, suggesting right atrial pressure of 8 mmHg. IAS/Shunts:  No atrial level shunt detected by color flow Doppler.  LEFT VENTRICLE PLAX 2D LVIDd:         3.50 cm     Diastology LVIDs:         2.30 cm     LV e' medial:    8.70 cm/s LV PW:         2.10 cm     LV E/e' medial:  6.1 LV IVS:        1.70 cm     LV e' lateral:   9.46 cm/s LVOT diam:     2.50 cm     LV E/e' lateral: 5.6  LV SV:         94 LV SV Index:   43 LVOT Area:     4.91 cm  LV Volumes (MOD) LV vol d, MOD A2C: 60.3 ml LV vol d, MOD A4C: 64.5 ml LV vol s, MOD A2C: 14.2 ml LV vol s, MOD A4C: 16.6 ml LV SV MOD A2C:     46.1 ml LV SV MOD A4C:     64.5 ml LV SV MOD BP:      50.2 ml RIGHT VENTRICLE             IVC RV S prime:     12.80 cm/s  IVC diam: 2.60 cm TAPSE (M-mode): 1.8 cm LEFT ATRIUM           Index      RIGHT ATRIUM           Index LA diam:      3.70 cm 1.69 cm/m RA Area:     25.50 cm LA Vol (A2C): 18.3 ml 8.38 ml/m RA Volume:   84.40 ml  38.66 ml/m LA Vol (A4C): 12.6 ml 5.77 ml/m  AORTIC VALVE LVOT Vmax:   127.00 cm/s LVOT Vmean:  96.000 cm/s LVOT VTI:    0.191 m  AORTA Ao Root diam: 3.70 cm MITRAL VALVE               TRICUSPID VALVE MV Area (PHT): 4.89 cm    TR Peak grad:   34.8 mmHg MV Decel Time: 155 msec    TR Vmax:        295.00 cm/s MV E velocity: 53.10 cm/s MV A velocity: 81.80 cm/s  SHUNTS MV E/A ratio:  0.65        Systemic VTI:  0.19 m                            Systemic Diam: 2.50 cm Lyman Bishop MD Electronically signed by Lyman Bishop MD Signature Date/Time: 09/24/2020/12:42:56 PM    Final    VAS [place] LOWER EXTREMITY VENOUS (DVT)  Result Date: 09/23/2020  Lower Venous DVT Study Indications: Swelling.  Risk Factors: None identified. Limitations: Poor ultrasound/tissue interface. Comparison Study: No prior studies. Performing Technologist: Oliver Hum RVT  Examination Guidelines: A complete evaluation includes B-mode imaging, spectral Doppler, color Doppler, and power Doppler as needed of all accessible portions of each vessel. Bilateral testing is considered an integral part of a complete examination. Limited examinations for reoccurring indications may be performed as noted. The reflux portion of the exam is performed with the patient in reverse Trendelenburg.  +---------+---------------+---------+-----------+----------+--------------+ RIGHT     CompressibilityPhasicitySpontaneityPropertiesThrombus Aging +---------+---------------+---------+-----------+----------+--------------+ CFV      Partial        No       No  Acute          +---------+---------------+---------+-----------+----------+--------------+ SFJ      Full                                                        +---------+---------------+---------+-----------+----------+--------------+ FV Prox  Partial        No       No                   Acute          +---------+---------------+---------+-----------+----------+--------------+ FV Mid   None           No       No                   Acute          +---------+---------------+---------+-----------+----------+--------------+ FV DistalNone           No       No                   Acute          +---------+---------------+---------+-----------+----------+--------------+ PFV      Full                                                        +---------+---------------+---------+-----------+----------+--------------+ POP      None           No       No                   Acute          +---------+---------------+---------+-----------+----------+--------------+ PTV      Partial                                      Acute          +---------+---------------+---------+-----------+----------+--------------+ PERO     None                                         Acute          +---------+---------------+---------+-----------+----------+--------------+ Gastroc  Full                                                        +---------+---------------+---------+-----------+----------+--------------+ EIV      Partial        No       No                   Acute          +---------+---------------+---------+-----------+----------+--------------+ CIV                     Yes      Yes                                  +---------+---------------+---------+-----------+----------+--------------+   +----+---------------+---------+-----------+----------+--------------+  LEFTCompressibilityPhasicitySpontaneityPropertiesThrombus Aging +----+---------------+---------+-----------+----------+--------------+ CFV Full           Yes      Yes                                 +----+---------------+---------+-----------+----------+--------------+     Summary: RIGHT: - Findings consistent with acute deep vein thrombosis involving the right external iliac vein, common femoral vein, right femoral vein, right popliteal vein, right posterior tibial veins, and right peroneal veins. - No cystic structure found in the popliteal fossa.   *See table(s) above for measurements and observations. Electronically signed by Deitra Mayo MD on 09/23/2020 at 4:35:04 PM.    Final    IMPRESSION:  *Declining Hgb and black stools x 2 on 12/21, hemoccult positive:  Hgb 7.5 grams this AM from 8.3 grams yesterday.  Has slowly been trending down entire hospitalization from normal Hgb of 13.8 grams on admission.   *History of GU in 2015, Hpylori negative then but then was positive on follow-up EGD with biopsy in 2016.  ? If he completed treatment. *Extensive DVT:  New this hospitalization.  Was started on IV heparin here. *Renal stone that had caused septic shock and Ecoli bacteremia:  Resolved/addressed by urology. *Elevated troponins:  PCCM thinks likely due to demand ischemia.  He denies chest pain. *Thrombocytopenia:  Improving.  Was likely due to sepsis.  PLAN: -EGD later this AM.  Heparin gtt turned off for now.  NPO. -Continue pantoprazole 40 mg PO BID for now. -Monitor Hgb and transfuse prn.  Laban Emperor. Sanaiya Welliver  09/25/2020, 8:42 AM

## 2020-09-25 NOTE — Progress Notes (Addendum)
Twining for IV heparin Indication: DVT  Allergies  Allergen Reactions  . Furosemide     Muscle cramps    Patient Measurements: Height: 6' (182.9 cm) Weight: 96.1 kg (211 lb 13.8 oz) IBW/kg (Calculated) : 77.6 Heparin Dosing Weight: 94.8 kg  Vital Signs: Temp: 98 F (36.7 C) (12/22 0745) Temp Source: Oral (12/22 0745) BP: 97/54 (12/22 1000) Pulse Rate: 90 (12/22 1000)  Labs: Recent Labs    09/23/20 2224 09/23/20 2224 09/24/20 0111 09/24/20 0611 09/24/20 0800 09/24/20 1049 09/24/20 1505 09/24/20 1518 09/24/20 1758 09/24/20 2239 09/25/20 0629  HGB 9.2*  --  8.7*  --   --   --   --  8.3*  --   --  7.5*  HCT 30.2*  --  28.5*  --   --   --   --  27.1*  --   --  24.9*  PLT 108*  --  118*  --   --   --   --  132*  --   --  132*  HEPARINUNFRC  --    < > 0.27*  --   --  <0.10*  --   --   --  0.40 0.54  CREATININE 1.14  --  1.04  --   --   --  1.04  --   --   --  1.11  CKTOTAL 32*  --   --   --   --   --   --   --   --   --   --   TROPONINIHS 220*  --   --    < > 1,061*  --  885*  --  873*  --   --    < > = values in this interval not displayed.    Estimated Creatinine Clearance: 72.3 mL/min (by C-G formula based on SCr of 1.11 mg/dL).   Medical History: Past Medical History:  Diagnosis Date  . Acute gastric ulcer with hemorrhage 07/30/2014  . Hypertension     Medications:  Medications Prior to Admission  Medication Sig Dispense Refill Last Dose  . acetaminophen (TYLENOL) 325 MG tablet Take 975 mg by mouth daily as needed for mild pain.   Past Month at Unknown time  . Coenzyme Q10 (CO Q 10) 100 MG CAPS Take 100 mg by mouth daily.   Past Week at Unknown time  . losartan (COZAAR) 50 MG tablet Take 50 mg by mouth daily.   Past Week at Unknown time  . Multiple Vitamin (MULTIVITAMIN) tablet Take 1 tablet by mouth daily.   Past Week at Unknown time  . tiZANidine (ZANAFLEX) 2 MG tablet Take 1 tablet by mouth 3 (three) times daily  as needed for muscle spasms.   Past Week at Unknown time  . cefdinir (OMNICEF) 300 MG capsule Take 1 capsule (300 mg total) by mouth 2 (two) times daily. (Patient not taking: Reported on 09/18/2020) 28 capsule 0 Not Taking at Unknown time   Scheduled:  . Chlorhexidine Gluconate Cloth  6 each Topical Daily  . feeding supplement (GLUCERNA SHAKE)  237 mL Oral TID BM  . folic acid  1 mg Oral Daily  . hydrocortisone sod succinate (SOLU-CORTEF) inj  50 mg Intravenous Q6H  . insulin aspart  0-15 Units Subcutaneous TID WC  . insulin aspart  0-5 Units Subcutaneous QHS  . multivitamin with minerals  1 tablet Oral Daily  . pantoprazole  40 mg Oral BID AC  .  potassium chloride  40 mEq Oral Once  . thiamine  100 mg Oral Daily   Infusions:  . sodium chloride 10 mL/hr at 09/25/20 0800  .  ceFAZolin (ANCEF) IV    . dextrose 75 mL/hr at 09/25/20 0929  . phenylephrine (NEO-SYNEPHRINE) Adult infusion 38 mcg/min (09/25/20 0929)   Assessment: 18 yoM admitted with sepsis d/t pyelonephritis. Noted to have RLE edema on 12/20 and LE doppler performed which revealed extensive R DVT. Pharmacy consulted to dose heparin. Per discussion with MD, low platelets felt due primarily to sepsis and are showing good recovery at time of heparin initiation.   Baseline INR, aPTT: INR mildly elevated; aPTT WNL  Prior anticoagulation: none  Significant events:  Today, 09/25/2020:  heparin level 0.54 - therapeutic on 1650 units/hr Heparin drip stopped at 07 am for EGD  CBC: Hgb down to 7.5; Plt low but now > 50k and stable  No bleeding or infusion issues per nursing & GI chart note  Goal of Therapy: Heparin level 0.3-0.7 units/ml Monitor platelets by anticoagulation protocol: Yes  Plan:  Heparin off for EGD- f/u for post-procedure orders  Daily CBC, daily heparin level once stable  Monitor for signs of bleeding or thrombosis  Eudelia Bunch, Pharm.D 09/25/2020 10:28 AM  Addendum: Heparin protocol DC'd  by CCM while ruling out [place], Pharm.D 09/25/2020 11:28 AM

## 2020-09-25 NOTE — Progress Notes (Addendum)
NAME:  Edward Wells, MRN:  [id number], DOB:  08-18-1946, LOS: 7 ADMISSION DATE:  09/17/2020, CONSULTATION DATE:  12/15 REFERRING MD:  Roxanne Mins, CHIEF COMPLAINT:  Found down   Brief History   74 y/o male brought to the ED on 12/14 after he was not answering his neighbor's calls, noted to be in septic shock due to pyelonephritis. He underwent cystoscopy with right ureteral stent placement. Returned to ICU in shock, AFwRVR, AKI due to obstructing stones.  Cultures positive for E-Coli bacteremia.  Transferred to medicine service 12/17.  Returned back to ICU with AMS and hypotension on 12/21 early am - possible slow GI loss / anemia vs sepsis vs hypovolemia.    Past Medical History  Hypertension Acute gastric ulcer with hemorrhage  Significant Hospital Events   12/14 Admit with pyelonephritis and septic shock 12/15 Off levophed 12/16 Cultures positive for e-coli, hemodynamically stable. Amiodarone stopped, in SR.  12/17 To [facility]  12/21 PCCM called back for hypotension, ? Melena, tmax 100.6 / WBC 12.3, cortisol 13 12/22 Neo down to 40 mcg, stress dose steroids started, pending EGD  Consults:  Urology  Procedures:    Significant Diagnostic Tests:   CT abdomen 12/15 >> 3 obstructing right proximal ureteral calculi causing moderate right hydronephrosis, pancolonic moderate diverticulosis  LE Venous Duplex 12/20 >> acute DVT in the R external iliac vein, common femoral vein, right femoral vein, right popliteal vein, right posterior tibial veins, and right peroneal veins  Renal US 12/21 >> mild fullness of the right renal pelvis without calyceal dilation, the appearance is improved compared to prior CT. Right double-J ureteral stent is seen in both the renal pelvis and urinary bladder  ECHO 12/21 >> LVEF 65-70%, no RWMA, severe concentric LV hypertrophy, Grade I diastolic dysfunction, RV systolic function normal, mildly elevated pulmonary artery systolic pressure at [id number], LA moderately dilated,  trivial to small pericardial effusion  Micro Data:  COVID 12/14 >> negative  Influenza A/B 12/14 >> negative  BCID 12/14 >> enterobacterales, e-coli detected  UC 12/15 >> 100k E-Coli >> pan sensitive  BCx2 12/14 >> E-Coli >> pan sensitive    Antimicrobials:  Cefepime 12/14 >> 12/15 Ceftriaxone 12/15 >> 12/22 Ancef 12/22 >>   Interim history/subjective:  Afebrile / tmax 98.9 Neosynephrine weaned to 40 mcg  Remains on heparin gtt  On 4L [place] O2 Glucose range 141-179 I/O UOP 1.2L, +5.4L in last 24 hours  Pt continues to report intermittent lower abdominal pain / cramping   Objective   Blood pressure (!) 102/59, pulse 87, temperature 98.9 F (37.2 C), temperature source Oral, resp. rate 18, height 6' (1.829 m), weight 96.1 kg, SpO2 100 %.        Intake/Output Summary (Last 24 hours) at 09/25/2020 0759 Last data filed at 09/25/2020 0600 Gross per 24 hour  Intake 6750.97 ml  Output 1300 ml  Net 5450.97 ml   Filed Weights   09/18/20 1103 09/18/20 1400 09/23/20 0451  Weight: 103.9 kg 94.8 kg 96.1 kg    Examination: General: adult male lying in bed in NAD HEENT: MM pink/moist, fair dentition, anicteric Neuro: AAOx4, speech clear, MAE  CV: s1s2 RRR, SR 80's on monitor, no m/r/g PULM: non-labored at rest on 4L [place], lungs bilaterally clear  GI: soft, bsx4 active, non-tender to palpation  Extremities: warm/dry, RLE > LLE 1+ edema  Skin: no rashes or lesions  Resolved Hospital Problem list   [facility]  Assessment & Plan:   Septic shock due to E-Coli Bacteremia in  setting of Pyelonephritis Volume resuscitated, required levophed on admit, pressors off 12/15. Lactic acid cleared. S/p cystoscopy with right ureteral stent placement per Urology 12/15.  Returned back to ICU on 12/21 with hypotension on neo.  -wean neo for MAP >65 -begin stress dose steroids, cortisol 13  -change to ancef for targeted organism coverage -tele monitoring   Obstructive hydronephrosis from  nephrolithiasis AKI  Hypernatremia  Hypomagnesemia  Admit BUN 211.  Repeat renal US on 12/21 w/o evidence of abscess / stent open  -reduce D5W at 75 ml/hr with hypernatremia  -follow serum Na trend  -additional K+ now -Trend BMP / urinary output -Replace electrolytes as indicated -Avoid nephrotoxic agents, ensure adequate renal perfusion  Suspected Demand Ischemia  Grade I Diastolic Dysfunction  Mild elevation of troponin with hypotension episode 12/21. Changes on ECHO consistent with chronic hypertension, mild elevation of pulmonary pressures -supportive care -follow fluid status, goal euvolemia   Anemia  Thrombocytopenia  Normal fibrinogen but may be acute phase reactant (consumption despite normal fibrinogen), no schistocytes.  Suspect changes in setting of severe sepsis rather than DIC.  -follow CBC closely  -monitor for bleeding  -transfuse for Hgb <7% or active bleeding  -assess anemia panel, type and screen   Dark Stool, Anemia, Rule out GIB  Hx of gastric ulcers in past, on heparin for DVT. FOBT positive.  -GI consulted, appreciate input  -anticipate EGD 12/22  -must also consider RP bleed, although abdominal exam benign   RLE DVT  -heparin gtt on hold for EGD -monitor for evidence of bleeding, note Hgb decreased to 7.5 -if hemodynamically significant bleed, may need to place IVC filter   Hyperglycemia: New diagnosis of diabetes (history supports) Hgb A1c 6.6 on admit  -SSI, moderate scale  -will need oral agent closer to discharge -carb modified diet as tolerated once EGD completed   Moderate Protein Calorie Malnutrition  -nutrition consult  -glucerna supplement  -MVI, folate, thiamine  Best practice (evaluated daily)  Diet: Diabetic diet  Pain/Anxiety/Delirium protocol (if indicated): n/a VAP protocol (if indicated): n/a DVT prophylaxis: SCD GI prophylaxis: n/a Glucose control: as above Mobility: up ad lib Last date of multidisciplinary goals of care  discussion: 12/21  Family and staff present: Niece Magda Paganini Summary of discussion: Reviewed goals of care in the event of critical illness to include cardiac arrest or respiratory failure and they elect for DNR. Medical care otherwise.  Follow up goals of care discussion due 12/28 Code Status: DNR  Disposition: ICU  Labs   CBC: Recent Labs  Lab 09/21/20 0543 09/22/20 0538 09/23/20 0509 09/23/20 2224 09/24/20 0111 09/24/20 1518 09/25/20 0629  WBC 8.0 7.1 9.4 12.0* 12.3* 10.3 10.4  NEUTROABS 6.0 5.3 7.4  --  9.9*  --  7.6  HGB 10.7* 9.6* 10.1* 9.2* 8.7* 8.3* 7.5*  HCT 34.0* 31.7* 33.6* 30.2* 28.5* 27.1* 24.9*  MCV 85.4 87.6 89.8 88.8 88.2 88.3 88.9  PLT 41* 63* 96* 108* 118* 132* 132*    Basic Metabolic Panel: Recent Labs  Lab 09/22/20 0538 09/23/20 0509 09/23/20 2224 09/24/20 0111 09/24/20 1505 09/25/20 0629  NA 153* 155* 147* 146* 148* 144  K 4.0 3.9 3.5 3.8 3.6 3.6  CL 117* 119* 114* 114* 116* 114*  CO2 26 27 24 25 26  20*  GLUCOSE 203* 184* 207* 211* 156* 179*  BUN 33* 22 18 23  30* 18  CREATININE 1.18 0.99 1.14 1.04 1.04 1.11  CALCIUM 8.6* 8.9 8.3* 8.1* 8.2* 8.0*  MG 1.7  --   --  1.4*  --   --    GFR: Estimated Creatinine Clearance: 72.3 mL/min (by C-G formula based on SCr of 1.11 mg/dL). Recent Labs  Lab 09/18/20 0929 09/19/20 0239 09/23/20 2224 09/24/20 0111 09/24/20 1506 09/24/20 1518 09/25/20 0629  WBC  --    < > 12.0* 12.3*  --  10.3 10.4  LATICACIDVEN 1.8  --  1.3 0.8 2.0*  --   --    < > = values in this interval not displayed.    Liver Function Tests: Recent Labs  Lab 09/22/20 0538 09/23/20 0509 09/23/20 2224 09/24/20 0111 09/25/20 0629  AST 23 31 27 28 26   ALT 32 39 36 34 32  ALKPHOS 55 57 55 48 42  BILITOT 0.8 0.7 0.7 0.3 0.5  PROT 5.3* 6.1* 5.4* 5.0* 4.9*  ALBUMIN 2.3* 2.6* 2.5* 2.2* 2.2*   No results for input(s): LIPASE, AMYLASE in the last 168 hours. No results for input(s): AMMONIA in the last 168 hours.  ABG    Component  Value Date/Time   PHART 7.507 (H) 09/23/2020 2020   PCO2ART 28.5 (L) 09/23/2020 2020   PO2ART 92.6 09/23/2020 2020   HCO3 22.4 09/23/2020 2020   O2SAT 97.7 09/23/2020 2020     Coagulation Profile: Recent Labs  Lab 09/18/20 0929  INR 1.5*    Cardiac Enzymes: Recent Labs  Lab 09/23/20 2224  CKTOTAL 32*    HbA1C: Hgb A1c MFr Bld  Date/Time Value Ref Range Status  09/18/2020 09:29 AM 6.6 (H) 4.8 - 5.6 % Final    Comment:    (NOTE) Pre diabetes:          5.7%-6.4%  Diabetes:              >6.4%  Glycemic control for   <7.0% adults with diabetes   02/16/2019 04:57 PM 5.7 (H) 4.8 - 5.6 % Final    Comment:    (NOTE) Pre diabetes:          5.7%-6.4% Diabetes:              >6.4% Glycemic control for   <7.0% adults with diabetes     CBG: Recent Labs  Lab 09/24/20 0800 09/24/20 1151 09/24/20 1708 09/24/20 2115 09/25/20 0739  GLUCAP 160* 134* 142* 135* 141*     Critical care time:  35 minutes   Noe Gens, MSN, NP-C, AGACNP-BC [facility] 09/25/2020, 7:59 AM   Please see [email or web address] for pager details.

## 2020-09-25 NOTE — Progress Notes (Signed)
PT Cancellation Note  Patient Details Name: Edward Wells MRN: [id number] DOB: Dec 26, 1945   Cancelled Treatment:    Reason Eval/Treat Not Completed: Medical issues which prohibited therapy,remains on pressers. Will follow for medical readiness for PT.    Claretha Cooper 09/25/2020, 7:04 AM [place] Pager [phone] Office [phone]

## 2020-09-25 NOTE — H&P (View-Only) (Signed)
Referring Provider:  Noe Gens, NP, PCCM Primary Care Physician:  Elizabeth Palau, MD (Inactive) Primary Gastroenterologist:  Dr. Deatra Ina  Reason for Consultation:  Decreasing Hgb, black stools  HPI: Edward Wells is a 74 y.o. male with limited PMH.  He presented to the hospital on 12/15 via EMS and was found to in septic shock/Ecoli bacteremia due to pyelonephritis due to obstructing renal stones.  Also had afib with RVR.  Improved and was transferred to hospitalist service.  Then on 12/21 he had aMS and hypotension.  Is back in ICU on low dose neo gtt.  Hgb has been trending down entire hospitalization from initial Hgb of 13.8 grams to 7.5 grams this AM.  Yesterday he had two black stools that were hemoccult positive so GI was consulted.  Today he looks good.  Family at bedside.  Laughing and joking.  No further sign of bleeding overnight or today.  Some mild intermittent lower abdominal pain (? GU related).  He says that at home he was moving his bowels well without sign of bleeding.  He denies NSAID use at home.  No heartburn/reflux.  EGD 11/2014 showed gastritis--+ Hpylori  EGD 07/2014 showed gastric ulcer--biopsies showed intestinal metaplasia, negative for Hpylori.  Colonoscopy 07/2012 showed hemorrhoids, diverticulosis, and a 13 mm sessile polyp--tubular adenoma   Past Medical History:  Diagnosis Date  . Acute gastric ulcer with hemorrhage 07/30/2014  . Hypertension     Past Surgical History:  Procedure Laterality Date  . COLONOSCOPY    . CYSTOSCOPY W/ URETERAL STENT PLACEMENT Right 09/18/2020   Procedure: CYSTOSCOPY WITH RETROGRADE PYELOGRAM/URETERAL STENT PLACEMENT;  Surgeon: Raynelle Bring, MD;  Location: [facility];  Service: Urology;  Laterality: Right;  . ESOPHAGOGASTRODUODENOSCOPY N/A 07/31/2014   Procedure: ESOPHAGOGASTRODUODENOSCOPY (EGD);  Surgeon: Gatha Mayer, MD;  Location: [facility] ENDOSCOPY;  Service: Endoscopy;  Laterality: N/A;  . ganglion cyst wrist  1971    right, x 2.  . HAMMER TOE SURGERY  1984   bilateral  . KNEE ARTHROSCOPY     left, 2004; right, 2001    Prior to Admission medications   Medication Sig Start Date End Date Taking? Authorizing Provider  acetaminophen (TYLENOL) 325 MG tablet Take 975 mg by mouth daily as needed for mild pain.   Yes [provider]  Coenzyme Q10 (CO Q 10) 100 MG CAPS Take 100 mg by mouth daily.   Yes [provider]  losartan (COZAAR) 50 MG tablet Take 50 mg by mouth daily. 01/18/20  Yes [provider]  Multiple Vitamin (MULTIVITAMIN) tablet Take 1 tablet by mouth daily.   Yes [provider]  tiZANidine (ZANAFLEX) 2 MG tablet Take 1 tablet by mouth 3 (three) times daily as needed for muscle spasms. 02/15/20  Yes [provider]  cefdinir (OMNICEF) 300 MG capsule Take 1 capsule (300 mg total) by mouth 2 (two) times daily. Patient not taking: Reported on 09/18/2020 03/11/20   Orpah Greek, MD    Current Facility-Administered Medications  Medication Dose Route Frequency Provider Last Rate Last Admin  . 0.9 %  sodium chloride infusion  250 mL Intravenous Continuous Polly Cobia, RPH 10 mL/hr at 09/25/20 0402 Infusion Verify at 09/25/20 0402  . cefTRIAXone (ROCEPHIN) 2 g in sodium chloride 0.9 % 100 mL IVPB  2 g Intravenous Q24H Donne Hazel, MD   Stopped at 09/24/20 2330  . Chlorhexidine Gluconate Cloth 2 % PADS 6 each  6 each Topical Daily Donne Hazel, MD  6 each at 09/24/20 0630  . dextrose 5 % solution   Intravenous Continuous Noe Gens L, NP 100 mL/hr at 09/25/20 0752 New Bag at 09/25/20 0752  . feeding supplement (GLUCERNA SHAKE) (GLUCERNA SHAKE) liquid 237 mL  237 mL Oral TID BM Ollis, Brandi L, NP      . folic acid (FOLVITE) tablet 1 mg  1 mg Oral Daily Ollis, Brandi L, NP      . heparin ADULT infusion 100 units/mL (25000 units/241mL sodium chloride 0.45%)  1,650 Units/hr Intravenous Continuous Thomes Lolling, [facility]   Stopped at 09/25/20  [id number]  . hydrocortisone sodium succinate (SOLU-CORTEF) 100 MG injection 50 mg  50 mg Intravenous Q6H Ollis, Brandi L, NP      . HYDROmorphone (DILAUDID) injection 0.5 mg  0.5 mg Intravenous Q2H PRN Donne Hazel, MD   0.5 mg at 09/23/20 2028  . insulin aspart (novoLOG) injection 0-15 Units  0-15 Units Subcutaneous TID WC Donne Hazel, MD   2 Units at 09/25/20 0750  . insulin aspart (novoLOG) injection 0-5 Units  0-5 Units Subcutaneous QHS Donne Hazel, MD      . multivitamin with minerals tablet 1 tablet  1 tablet Oral Daily Ollis, Brandi L, NP      . ondansetron (ZOFRAN) injection 4 mg  4 mg Intravenous Q6H PRN Donne Hazel, MD   4 mg at 09/24/20 2124  . pantoprazole (PROTONIX) EC tablet 40 mg  40 mg Oral BID AC Ollis, Brandi L, NP   40 mg at 09/25/20 0751  . phenylephrine (NEOSYNEPHRINE) 10-0.9 MG/250ML-% infusion  25-200 mcg/min Intravenous Titrated Polly Cobia, RPH 52.5 mL/hr at 09/25/20 0402 35 mcg/min at 09/25/20 0402  . potassium chloride SA (KLOR-CON) CR tablet 40 mEq  40 mEq Oral Once Ollis, Brandi L, NP      . thiamine tablet 100 mg  100 mg Oral Daily Noe Gens L, NP        Allergies as of 09/17/2020 - Review Complete 03/11/2020  Allergen Reaction Noted  . Furosemide  07/30/2014    Family History  Problem Relation Age of Onset  . Hypertension Mother   . Colon cancer Neg Hx   . Stomach cancer Neg Hx   . Esophageal cancer Neg Hx   . Rectal cancer Neg Hx     Social History   Socioeconomic History  . Marital status: Divorced    Spouse name: Not on file  . Number of children: Not on file  . Years of education: Not on file  . Highest education level: Not on file  Occupational History  . Occupation: retired  Tobacco Use  . Smoking status: Former Smoker    Start date: 10/05/2018  . Smokeless tobacco: Never Used  Substance and Sexual Activity  . Alcohol use: Not Currently    Comment: h/o heavy use; last drank 10/04/2018  . Drug use: No  . Sexual activity:  Not on file  Other Topics Concern  . Not on file  Social History Narrative   Retired Set designer.     Moved to [place] ~ 2007.       Would drink 750 ml vodka over the course of 2 weeks.  Quit etoh and cigs September 2015.    Social Determinants of Health   Financial Resource Strain: Not on file  Food Insecurity: Not on file  Transportation Needs: Not on file  Physical Activity: Not on file  Stress: Not on file  Social  Connections: Not on file  Intimate Partner Violence: Not on file    Review of Systems: ROS is O/W negative except as mentioned in HPI.  Physical Exam: Vital signs in last 24 hours: Temp:  [97.6 F (36.4 C)-100.6 F (38.1 C)] 98 F (36.7 C) (12/22 0745) Pulse Rate:  [84-114] 90 (12/22 0800) Resp:  [13-30] 16 (12/22 0800) BP: (75-160)/(38-77) 111/55 (12/22 0800) SpO2:  [90 %-100 %] 92 % (12/22 0800) Last BM Date: 09/24/20 General:  Alert, Well-developed, well-nourished, pleasant and cooperative in NAD Head:  Normocephalic and atraumatic. Eyes:  Sclera clear, no icterus.  Conjunctiva pink. Ears:  Normal auditory acuity. Mouth:  No deformity or lesions.   Lungs:  Clear throughout to auscultation.  No wheezes, crackles, or rhonchi.  Heart:  Regular rate and rhythm; no murmurs, clicks, rubs, or gallops. Abdomen:  Soft, non-distended.  BS present.  Non-tender.   Msk:  Symmetrical without gross deformities. Pulses:  Normal pulses noted. Extremities:  Without clubbing or edema. Neurologic:  Alert and oriented x 4;  grossly normal neurologically. Skin:  Intact without significant lesions or rashes. Psych:  Alert and cooperative. Normal mood and affect.  Intake/Output from previous day: 12/21 0701 - 12/22 0700 In: 6751 [P.O.:240; I.V.:6112.3; IV Piggyback:398.7] Out: 1300 [Urine:1250; Emesis/NG output:50]  Lab Results: Recent Labs    09/24/20 0111 09/24/20 1518 09/25/20 0629  WBC 12.3* 10.3 10.4  HGB 8.7* 8.3* 7.5*  HCT 28.5* 27.1* 24.9*   PLT 118* 132* 132*   BMET Recent Labs    09/24/20 0111 09/24/20 1505 09/25/20 0629  NA 146* 148* 144  K 3.8 3.6 3.6  CL 114* 116* 114*  CO2 25 26 20*  GLUCOSE 211* 156* 179*  BUN 23 30* 18  CREATININE 1.04 1.04 1.11  CALCIUM 8.1* 8.2* 8.0*   LFT Recent Labs    09/25/20 0629  PROT 4.9*  ALBUMIN 2.2*  AST 26  ALT 32  ALKPHOS 42  BILITOT 0.5   Studies/Results: US RENAL  Result Date: 09/24/2020 CLINICAL DATA:  Patient diagnosed with renal stones and right hydronephrosis by CT 09/18/2020. Double-J stent was placed 09/22/2020. EXAM: RENAL / URINARY TRACT ULTRASOUND COMPLETE COMPARISON:  CT abdomen and pelvis 09/18/2020. FINDINGS: Right Kidney: Renal measurements: 15.7 x 6.5 x 5.5 = volume: 298 mL. Cortical echogenicity is unremarkable. Two simple cysts are identified measuring 1.4 cm in the upper pole and 2.8 cm in the midpole. There is mild of the right renal pelvis without calyceal dilatation. Renal stone seen on prior CT are difficult to visualize on this examination. Double-J stent noted. Left Kidney: Renal measurements: 12.5 x 5.6 x 5.2 cm = volume: 193 mL. Cortical echogenicity is unremarkable. No hydronephrosis. Two cysts in the upper pole are identified as seen on prior CT. The larger measures 4.4 cm. Bladder: Right double-J ureteral stent is seen. Other: None. IMPRESSION: Mild fullness of the right renal pelvis without calyceal dilatation. The appearance is improved compared to the prior CT. Right double-J ureteral stent is seen at both the renal pelvis and urinary bladder. Bilateral renal cysts. Increased cortical echogenicity bilaterally suggestive of medical renal disease. Electronically Signed   By: Inge Rise M.D.   On: 09/24/2020 16:39   ECHOCARDIOGRAM COMPLETE  Result Date: 09/24/2020    ECHOCARDIOGRAM REPORT   Patient Name:   MANJINDER CARAWAY Date of Exam: 09/24/2020 Medical Rec #:  [id number]    Height:       72.0 in Accession #:    [id number]   Weight:  211.9  lb Date of Birth:  [birth year]     BSA:          2.183 m Patient Age:    62 years     BP:           101/56 mmHg Patient Gender: M            HR:           104 bpm. Exam Location:  Inpatient Procedure: 2D Echo, Cardiac Doppler and Color Doppler Indications:    R07.9* Chest pain, unspecified. Elevated troponin.  History:        Patient has no prior history of Echocardiogram examinations.                 Signs/Symptoms:Chest Pain and Bacteremia; Risk                 Factors:Hypertension. Elevated troponin.  Sonographer:    Roseanna Rainbow RDCS Referring Phys: [place]  Sonographer Comments: Technically difficult study due to poor echo windows. IMPRESSIONS  1. Left ventricular ejection fraction, by estimation, is 65 to 70%. The left ventricle has normal function. The left ventricle has no regional wall motion abnormalities. There is severe concentric left ventricular hypertrophy. Left ventricular diastolic  parameters are consistent with Grade I diastolic dysfunction (impaired relaxation).  2. Right ventricular systolic function is normal. The right ventricular size is mildly enlarged. There is mildly elevated pulmonary artery systolic pressure. The estimated right ventricular systolic pressure is 0000000 mmHg.  3. Left atrial size was moderately dilated.  4. Trivial to small pericardial effusion. The pericardial effusion is circumferential.  5. The mitral valve is grossly normal. Trivial mitral valve regurgitation.  6. The aortic valve is tricuspid. Aortic valve regurgitation is not visualized. Mild aortic valve sclerosis is present, with no evidence of aortic valve stenosis.  7. The inferior vena cava is dilated in size with >50% respiratory variability, suggesting right atrial pressure of 8 mmHg. Comparison(s): No prior Echocardiogram. FINDINGS  Left Ventricle: Left ventricular ejection fraction, by estimation, is 65 to 70%. The left ventricle has normal function. The left ventricle has no regional wall motion  abnormalities. The left ventricular internal cavity size was small. There is severe concentric left ventricular hypertrophy. Left ventricular diastolic parameters are consistent with Grade I diastolic dysfunction (impaired relaxation). Normal left ventricular filling pressure. Right Ventricle: The right ventricular size is mildly enlarged. No increase in right ventricular wall thickness. Right ventricular systolic function is normal. There is mildly elevated pulmonary artery systolic pressure. The tricuspid regurgitant velocity is 2.95 m/s, and with an assumed right atrial pressure of 8 mmHg, the estimated right ventricular systolic pressure is 0000000 mmHg. Left Atrium: Left atrial size was moderately dilated. Right Atrium: Right atrial size was normal in size. Pericardium: Trivial pericardial effusion is present. The pericardial effusion is circumferential. Mitral Valve: The mitral valve is grossly normal. Trivial mitral valve regurgitation. Tricuspid Valve: The tricuspid valve is grossly normal. Tricuspid valve regurgitation is mild. Aortic Valve: The aortic valve is tricuspid. There is mild aortic valve annular calcification. Aortic valve regurgitation is not visualized. Mild aortic valve sclerosis is present, with no evidence of aortic valve stenosis. Pulmonic Valve: The pulmonic valve was normal in structure. Pulmonic valve regurgitation is trivial. Aorta: The aortic root and ascending aorta are structurally normal, with no evidence of dilitation. Venous: The inferior vena cava is dilated in size with greater than 50% respiratory variability, suggesting right atrial pressure of 8 mmHg. IAS/Shunts:  No atrial level shunt detected by color flow Doppler.  LEFT VENTRICLE PLAX 2D LVIDd:         3.50 cm     Diastology LVIDs:         2.30 cm     LV e' medial:    8.70 cm/s LV PW:         2.10 cm     LV E/e' medial:  6.1 LV IVS:        1.70 cm     LV e' lateral:   9.46 cm/s LVOT diam:     2.50 cm     LV E/e' lateral: 5.6  LV SV:         94 LV SV Index:   43 LVOT Area:     4.91 cm  LV Volumes (MOD) LV vol d, MOD A2C: 60.3 ml LV vol d, MOD A4C: 64.5 ml LV vol s, MOD A2C: 14.2 ml LV vol s, MOD A4C: 16.6 ml LV SV MOD A2C:     46.1 ml LV SV MOD A4C:     64.5 ml LV SV MOD BP:      50.2 ml RIGHT VENTRICLE             IVC RV S prime:     12.80 cm/s  IVC diam: 2.60 cm TAPSE (M-mode): 1.8 cm LEFT ATRIUM           Index      RIGHT ATRIUM           Index LA diam:      3.70 cm 1.69 cm/m RA Area:     25.50 cm LA Vol (A2C): 18.3 ml 8.38 ml/m RA Volume:   84.40 ml  38.66 ml/m LA Vol (A4C): 12.6 ml 5.77 ml/m  AORTIC VALVE LVOT Vmax:   127.00 cm/s LVOT Vmean:  96.000 cm/s LVOT VTI:    0.191 m  AORTA Ao Root diam: 3.70 cm MITRAL VALVE               TRICUSPID VALVE MV Area (PHT): 4.89 cm    TR Peak grad:   34.8 mmHg MV Decel Time: 155 msec    TR Vmax:        295.00 cm/s MV E velocity: 53.10 cm/s MV A velocity: 81.80 cm/s  SHUNTS MV E/A ratio:  0.65        Systemic VTI:  0.19 m                            Systemic Diam: 2.50 cm Lyman Bishop MD Electronically signed by Lyman Bishop MD Signature Date/Time: 09/24/2020/12:42:56 PM    Final    VAS [place] LOWER EXTREMITY VENOUS (DVT)  Result Date: 09/23/2020  Lower Venous DVT Study Indications: Swelling.  Risk Factors: None identified. Limitations: Poor ultrasound/tissue interface. Comparison Study: No prior studies. Performing Technologist: Oliver Hum RVT  Examination Guidelines: A complete evaluation includes B-mode imaging, spectral Doppler, color Doppler, and power Doppler as needed of all accessible portions of each vessel. Bilateral testing is considered an integral part of a complete examination. Limited examinations for reoccurring indications may be performed as noted. The reflux portion of the exam is performed with the patient in reverse Trendelenburg.  +---------+---------------+---------+-----------+----------+--------------+ RIGHT     CompressibilityPhasicitySpontaneityPropertiesThrombus Aging +---------+---------------+---------+-----------+----------+--------------+ CFV      Partial        No       No  Acute          +---------+---------------+---------+-----------+----------+--------------+ SFJ      Full                                                        +---------+---------------+---------+-----------+----------+--------------+ FV Prox  Partial        No       No                   Acute          +---------+---------------+---------+-----------+----------+--------------+ FV Mid   None           No       No                   Acute          +---------+---------------+---------+-----------+----------+--------------+ FV DistalNone           No       No                   Acute          +---------+---------------+---------+-----------+----------+--------------+ PFV      Full                                                        +---------+---------------+---------+-----------+----------+--------------+ POP      None           No       No                   Acute          +---------+---------------+---------+-----------+----------+--------------+ PTV      Partial                                      Acute          +---------+---------------+---------+-----------+----------+--------------+ PERO     None                                         Acute          +---------+---------------+---------+-----------+----------+--------------+ Gastroc  Full                                                        +---------+---------------+---------+-----------+----------+--------------+ EIV      Partial        No       No                   Acute          +---------+---------------+---------+-----------+----------+--------------+ CIV                     Yes      Yes                                  +---------+---------------+---------+-----------+----------+--------------+   +----+---------------+---------+-----------+----------+--------------+  LEFTCompressibilityPhasicitySpontaneityPropertiesThrombus Aging +----+---------------+---------+-----------+----------+--------------+ CFV Full           Yes      Yes                                 +----+---------------+---------+-----------+----------+--------------+     Summary: RIGHT: - Findings consistent with acute deep vein thrombosis involving the right external iliac vein, common femoral vein, right femoral vein, right popliteal vein, right posterior tibial veins, and right peroneal veins. - No cystic structure found in the popliteal fossa.   *See table(s) above for measurements and observations. Electronically signed by Deitra Mayo MD on 09/23/2020 at 4:35:04 PM.    Final    IMPRESSION:  *Declining Hgb and black stools x 2 on 12/21, hemoccult positive:  Hgb 7.5 grams this AM from 8.3 grams yesterday.  Has slowly been trending down entire hospitalization from normal Hgb of 13.8 grams on admission.   *History of GU in 2015, Hpylori negative then but then was positive on follow-up EGD with biopsy in 2016.  ? If he completed treatment. *Extensive DVT:  New this hospitalization.  Was started on IV heparin here. *Renal stone that had caused septic shock and Ecoli bacteremia:  Resolved/addressed by urology. *Elevated troponins:  PCCM thinks likely due to demand ischemia.  He denies chest pain. *Thrombocytopenia:  Improving.  Was likely due to sepsis.  PLAN: -EGD later this AM.  Heparin gtt turned off for now.  NPO. -Continue pantoprazole 40 mg PO BID for now. -Monitor Hgb and transfuse prn.  Laban Emperor. Oneda Duffett  09/25/2020, 8:42 AM

## 2020-09-26 ENCOUNTER — Encounter (HOSPITAL_COMMUNITY)
Admission: EM | Disposition: A | Discharge status: Home Health | Payer: Self-pay | Source: Home / Self Care | Attending: Internal Medicine

## 2020-09-26 ENCOUNTER — Inpatient Hospital Stay (HOSPITAL_COMMUNITY): Payer: Medicare Other | Admitting: Anesthesiology

## 2020-09-26 ENCOUNTER — Encounter (HOSPITAL_COMMUNITY): Payer: Self-pay | Admitting: Pulmonary Disease

## 2020-09-26 DIAGNOSIS — K269 Duodenal ulcer, unspecified as acute or chronic, without hemorrhage or perforation: Secondary | ICD-10-CM | POA: Diagnosis not present

## 2020-09-26 DIAGNOSIS — K259 Gastric ulcer, unspecified as acute or chronic, without hemorrhage or perforation: Secondary | ICD-10-CM

## 2020-09-26 DIAGNOSIS — K921 Melena: Secondary | ICD-10-CM | POA: Diagnosis not present

## 2020-09-26 DIAGNOSIS — K3189 Other diseases of stomach and duodenum: Secondary | ICD-10-CM

## 2020-09-26 DIAGNOSIS — E2740 Unspecified adrenocortical insufficiency: Secondary | ICD-10-CM | POA: Diagnosis not present

## 2020-09-26 DIAGNOSIS — N139 Obstructive and reflux uropathy, unspecified: Secondary | ICD-10-CM | POA: Diagnosis not present

## 2020-09-26 DIAGNOSIS — D62 Acute posthemorrhagic anemia: Secondary | ICD-10-CM | POA: Diagnosis not present

## 2020-09-26 DIAGNOSIS — D649 Anemia, unspecified: Secondary | ICD-10-CM | POA: Diagnosis not present

## 2020-09-26 HISTORY — PX: BIOPSY: SHX5522

## 2020-09-26 HISTORY — PX: ESOPHAGOGASTRODUODENOSCOPY (EGD) WITH PROPOFOL: SHX5813

## 2020-09-26 HISTORY — PX: HEMOSTASIS CONTROL: SHX6838

## 2020-09-26 LAB — BPAM RBC
Blood Product Expiration Date: 202201182359
ISSUE DATE / TIME: 202112221150
Unit Type and Rh: 7300

## 2020-09-26 LAB — GLUCOSE, CAPILLARY
Glucose-Capillary: 195 mg/dL — ABNORMAL HIGH (ref 70–99)
Glucose-Capillary: 169 mg/dL — ABNORMAL HIGH (ref 70–99)
Glucose-Capillary: 194 mg/dL — ABNORMAL HIGH (ref 70–99)
Glucose-Capillary: 241 mg/dL — ABNORMAL HIGH (ref 70–99)
Glucose-Capillary: 170 mg/dL — ABNORMAL HIGH (ref 70–99)

## 2020-09-26 LAB — CBC WITH DIFFERENTIAL/PLATELET
Abs Immature Granulocytes: 0.03 10*3/uL (ref 0.00–0.07)
Basophils Absolute: 0 10*3/uL (ref 0.0–0.1)
Basophils Relative: 0 %
Eosinophils Absolute: 0 10*3/uL (ref 0.0–0.5)
Eosinophils Relative: 0 %
HCT: 26.2 % — ABNORMAL LOW (ref 39.0–52.0)
Hemoglobin: 8.1 g/dL — ABNORMAL LOW (ref 13.0–17.0)
Immature Granulocytes: 0 %
Lymphocytes Relative: 8 %
Lymphs Abs: 0.7 10*3/uL (ref 0.7–4.0)
MCH: 26.5 pg (ref 26.0–34.0)
MCHC: 30.9 g/dL (ref 30.0–36.0)
MCV: 85.6 fL (ref 80.0–100.0)
Monocytes Absolute: 0.3 10*3/uL (ref 0.1–1.0)
Monocytes Relative: 4 %
Neutro Abs: 7.5 10*3/uL (ref 1.7–7.7)
Neutrophils Relative %: 88 %
Platelets: 131 10*3/uL — ABNORMAL LOW (ref 150–400)
RBC: 3.06 MIL/uL — ABNORMAL LOW (ref 4.22–5.81)
RDW: 17.2 % — ABNORMAL HIGH (ref 11.5–15.5)
WBC: 8.6 10*3/uL (ref 4.0–10.5)
nRBC: 0 % (ref 0.0–0.2)

## 2020-09-26 LAB — BASIC METABOLIC PANEL
Anion gap: 10 (ref 5–15)
BUN: 15 mg/dL (ref 8–23)
CO2: 20 mmol/L — ABNORMAL LOW (ref 22–32)
Calcium: 8.1 mg/dL — ABNORMAL LOW (ref 8.9–10.3)
Chloride: 111 mmol/L (ref 98–111)
Creatinine, Ser: 1.26 mg/dL — ABNORMAL HIGH (ref 0.61–1.24)
GFR, Estimated: >60 mL/min (ref >60)
Glucose, Bld: 275 mg/dL — ABNORMAL HIGH (ref 70–99)
Potassium: 4 mmol/L (ref 3.5–5.1)
Sodium: 141 mmol/L (ref 135–145)

## 2020-09-26 LAB — TYPE AND SCREEN
ABO/RH(D): B POS
Antibody Screen: NEGATIVE
Unit division: 0

## 2020-09-26 LAB — MAGNESIUM: Magnesium: 1.6 mg/dL — ABNORMAL LOW (ref 1.7–2.4)

## 2020-09-26 SURGERY — ESOPHAGOGASTRODUODENOSCOPY (EGD) WITH PROPOFOL
Anesthesia: Monitor Anesthesia Care

## 2020-09-26 MED ORDER — PROPOFOL 10 MG/ML IV BOLUS
INTRAVENOUS | Status: DC | PRN
  Administered 2020-09-26: 10 mg via INTRAVENOUS
  Administered 2020-09-26 (×2): 30 mg via INTRAVENOUS

## 2020-09-26 MED ORDER — PANTOPRAZOLE SODIUM 40 MG IV SOLR: 40.0000 mg | Freq: Two times a day (BID) | INTRAVENOUS | Status: DC

## 2020-09-26 MED ORDER — SODIUM CHLORIDE 0.9 % IV SOLN
8.0000 mg/h | INTRAVENOUS | Status: AC
  Administered 2020-09-26 – 2020-09-29 (×7): 8 mg/h via INTRAVENOUS
  Filled 2020-09-26 (×10): qty 80

## 2020-09-26 MED ORDER — LIDOCAINE 2% (20 MG/ML) 5 ML SYRINGE
INTRAMUSCULAR | Status: DC | PRN
  Administered 2020-09-26: 100 mg via INTRAVENOUS

## 2020-09-26 MED ORDER — LACTATED RINGERS IV SOLN: INTRAVENOUS | Status: DC

## 2020-09-26 MED ORDER — MAGNESIUM SULFATE 4 GM/100ML IV SOLN
4.0000 g | Freq: Once | INTRAVENOUS | Status: AC
  Administered 2020-09-26: 12:00:00 4 g via INTRAVENOUS
  Filled 2020-09-26: qty 100

## 2020-09-26 MED ORDER — MAGNESIUM SULFATE 4 GM/100ML IV SOLN: 4.0000 g | Freq: Once | INTRAVENOUS | Status: DC

## 2020-09-26 MED ORDER — PROPOFOL 10 MG/ML IV BOLUS
INTRAVENOUS | Status: AC
  Filled 2020-09-26: qty 20

## 2020-09-26 MED ORDER — HYDROCORTISONE NA SUCCINATE PF 100 MG IJ SOLR
50.0000 mg | Freq: Three times a day (TID) | INTRAMUSCULAR | Status: DC
  Administered 2020-09-26 – 2020-09-27 (×3): 50 mg via INTRAVENOUS
  Filled 2020-09-26 (×3): qty 2

## 2020-09-26 MED ORDER — PROPOFOL 500 MG/50ML IV EMUL
INTRAVENOUS | Status: AC
  Filled 2020-09-26: qty 50

## 2020-09-26 MED ORDER — PHENYLEPHRINE 40 MCG/ML (10ML) SYRINGE FOR IV PUSH (FOR BLOOD PRESSURE SUPPORT)
PREFILLED_SYRINGE | INTRAVENOUS | Status: DC | PRN
  Administered 2020-09-26 (×2): 120 ug via INTRAVENOUS

## 2020-09-26 MED ORDER — PROPOFOL 500 MG/50ML IV EMUL
INTRAVENOUS | Status: DC | PRN
  Administered 2020-09-26: 125 ug/kg/min via INTRAVENOUS

## 2020-09-26 MED ORDER — SODIUM CHLORIDE 0.9 % IV SOLN: INTRAVENOUS | Status: DC

## 2020-09-26 MED ORDER — INSULIN ASPART 100 UNIT/ML ~~LOC~~ SOLN
3.0000 [IU] | Freq: Once | SUBCUTANEOUS | Status: AC
  Administered 2020-09-26: 05:00:00 3 [IU] via SUBCUTANEOUS

## 2020-09-26 MED ORDER — PROSOURCE PLUS PO LIQD
30.0000 mL | Freq: Two times a day (BID) | ORAL | Status: DC
  Administered 2020-09-26 – 2020-10-02 (×13): 30 mL via ORAL
  Filled 2020-09-26 (×12): qty 30

## 2020-09-26 MED ORDER — SODIUM CHLORIDE 0.9 % IV SOLN
80.0000 mg | Freq: Once | INTRAVENOUS | Status: AC
  Administered 2020-09-26: 11:00:00 80 mg via INTRAVENOUS
  Filled 2020-09-26: qty 80

## 2020-09-26 SURGICAL SUPPLY — 15 items

## 2020-09-26 NOTE — Op Note (Addendum)
[facility] Patient Name: Edward Wells Procedure Date: 09/26/2020 MRN: [id number] Attending MD: Ladene Artist , MD Date of Birth: October 12, 1945 CSN: [id number] Age: 74 Admit Type: Inpatient Procedure:                Upper GI endoscopy Indications:              Acute post hemorrhagic anemia, Melena Providers:                Pricilla Riffle. Fuller Plan, MD, Erenest Rasher, RN, Tyrone Apple, Technician, Adair Laundry, CRNA Referring MD:             CCM Medicines:                Propofol per Anesthesia Complications:            No immediate complications. Estimated Blood Loss:     Estimated blood loss was minimal. Procedure:                Pre-Anesthesia Assessment:                           - Prior to the procedure, a History and Physical                            was performed, and patient medications and                            allergies were reviewed. The patient's tolerance of                            previous anesthesia was also reviewed. The risks                            and benefits of the procedure and the sedation                            options and risks were discussed with the patient.                            All questions were answered, and informed consent                            was obtained. Prior Anticoagulants: The patient has                            taken heparin, last dose was 1 day prior to                            procedure. ASA Grade Assessment: III - A patient                            with severe systemic disease. After reviewing the  risks and benefits, the patient was deemed in                            satisfactory condition to undergo the procedure.                           After obtaining informed consent, the endoscope was                            passed under direct vision. Throughout the                            procedure, the patient's blood pressure, pulse, and                             oxygen saturations were monitored continuously. The                            [id number]-[id number] Olympus gastroscope was                            introduced through the mouth, and advanced to the                            second part of duodenum. The upper GI endoscopy was                            accomplished without difficulty. The patient                            tolerated the procedure well. Scope In: Scope Out: Findings:      The examined esophagus was normal.      One non-bleeding cratered gastric ulcer with pigmented raised material       was found on the lesser curvature of the stomach near incisura. The       lesion was 15 mm in largest dimension. Coagulation for bleeding       prevention using bipolar probe was successful. Estimated blood loss was       minimal.      Three non-bleeding cratered gastric ulcers with no stigmata of bleeding       were found on the lesser curvature of the gastric antrum. The largest       lesion was 8 mm in largest dimension.      Multiple severe dispersed medium erosions with no bleeding and no       stigmata of recent bleeding were found in the gastric body and in the       gastric antrum. Biopsies were taken with a cold forceps for histology.      The exam of the stomach was otherwise normal.      A few localized erosions without bleeding were found in the duodenal       bulb.      The exam of the duodenum was otherwise normal. Impression:               - Normal esophagus.                           -  One non-bleeding gastric ulcer on lesser                            curvature with pigmented raised material. Treated                            with bipolar cautery.                           - Three non-bleeding gastric antral ulcers with no                            stigmata of bleeding.                           - Severe erosive gastropathy with no bleeding and                            no stigmata of recent  bleeding. Biopsied.                           - Duodenal erosions without bleeding. Moderate Sedation:      Not Applicable - Patient had care per Anesthesia. Recommendation:           - Return patient to hospital ward for ongoing care.                           - Clear liquid diet for now.                           - Continue present medications.                           - IV pantoprazole infusion for 3 days then                            pantoprazole 40 mg IV or PO bid.                           - No aspirin, ibuprofen, naproxen, or other                            non-steroidal anti-inflammatory drugs long term.                           - Await pathology results.                           - Resume heparin at prior dose tomorrow per primary                            service as indicated. Higher risk of rebleeding on                            anticoagulation. Primary service to assess risks,  benefits. Refer to managing physician for further                            adjustment of therapy.                           - EGD in about 3 months to assess healing per Dr.                            Havery Moros. Procedure Code(s):        --- Professional ---                           [phone], 9, Esophagogastroduodenoscopy, flexible,                            transoral; with control of bleeding, any method                           43239, Esophagogastroduodenoscopy, flexible,                            transoral; with biopsy, single or multiple Diagnosis Code(s):        --- Professional ---                           K25.9, Gastric ulcer, unspecified as acute or                            chronic, without hemorrhage or perforation                           K31.89, Other diseases of stomach and duodenum                           K26.9, Duodenal ulcer, unspecified as acute or                            chronic, without hemorrhage or perforation                            D62, Acute posthemorrhagic anemia                           K92.1, Melena (includes Hematochezia) CPT copyright 2019 American Medical Association. All rights reserved. The codes documented in this report are preliminary and upon coder review may  be revised to meet current compliance requirements. Ladene Artist, MD 09/26/2020 9:40:33 AM This report has been signed electronically. Number of Addenda: 0

## 2020-09-26 NOTE — Transfer of Care (Signed)
Immediate Anesthesia Transfer of Care Note  Patient: Edward Wells  Procedure(s) Performed: ESOPHAGOGASTRODUODENOSCOPY (EGD) WITH PROPOFOL (N/A ) BIOPSY HEMOSTASIS CONTROL  Patient Location: PACU  Anesthesia Type:MAC  Level of Consciousness: drowsy and patient cooperative  Airway & Oxygen Therapy: Patient Spontanous Breathing and Patient connected to face mask oxygen  Post-op Assessment: Report given to RN and Post -op Vital signs reviewed and stable  Post vital signs: Reviewed and stable  Last Vitals:  Vitals Value Taken Time  BP    Temp    Pulse 66 09/26/20 0936  Resp 17 09/26/20 0936  SpO2 100 % 09/26/20 0936  Vitals shown include unvalidated device data.  Last Pain:  Vitals:   09/26/20 0830  TempSrc: Oral  PainSc: 0-No pain      Patients Stated Pain Goal: 3 (71/83/67 2550)  Complications: No complications documented.

## 2020-09-26 NOTE — Anesthesia Preprocedure Evaluation (Addendum)
Anesthesia Evaluation  Patient identified by MRN, date of birth, ID band Patient awake    Reviewed: Allergy & Precautions, NPO status , Patient's Chart, lab work & pertinent test results  Airway Mallampati: II  TM Distance: >3 FB Neck ROM: Full    Dental no notable dental hx. (+) Teeth Intact, Caps, Dental Advisory Given   Pulmonary former smoker,    breath sounds clear to auscultation + decreased breath sounds      Cardiovascular hypertension, Pt. on medications + DVT  Normal cardiovascular exam+ dysrhythmias  Rhythm:Regular Rate:Normal  Hx/o A. Fib with RVR this admission- converted to NSR  EKG 09/23/20 Sinus tachycardia  Echo 09/24/20 1. Left ventricular ejection fraction, by estimation, is 65 to 70%. The left ventricle has normal function. The left ventricle has no regional wall motion abnormalities. There is severe concentric left ventricular hypertrophy. Left ventricular diastolic parameters are consistent with Grade I diastolic dysfunction (impaired relaxation).  2. Right ventricular systolic function is normal. The right ventricular size is mildly enlarged. There is mildly elevated pulmonary artery systolic pressure. The estimated right ventricular systolic pressure is  86.5 mmHg.  3. Left atrial size was moderately dilated.  4. Trivial to small pericardial effusion. The pericardial effusion is  circumferential.  5. The mitral valve is grossly normal. Trivial mitral valve  regurgitation.  6. The aortic valve is tricuspid. Aortic valve regurgitation is not visualized. Mild aortic valve sclerosis is present, with no evidence of aortic valve stenosis.  7. The inferior vena cava is dilated in size with >50% respiratory variability, suggesting right atrial pressure of 8 mmHg   Neuro/Psych negative neurological ROS  negative psych ROS   GI/Hepatic Neg liver ROS, PUD, Melena Hx/o gastric ulcer   Endo/Other   Hyperglycemia  Renal/GU ARF and Renal InsufficiencyRenal diseaseHx/o nephrolithiasis with obstruction and acute pyelonephritis with E. Coli bacteremia ARF - resolved with minimal renal insufficiency  negative genitourinary   Musculoskeletal   Abdominal   Peds  Hematology  (+) anemia , Recent SIRS Heparin therapy due to DVT   Anesthesia Other Findings   Reproductive/Obstetrics                            Anesthesia Physical Anesthesia Plan  ASA: III  Anesthesia Plan: MAC   Post-op Pain Management:    Induction: Intravenous  PONV Risk Score and Plan: 1 and Propofol infusion and Treatment may vary due to age or medical condition  Airway Management Planned: Natural Airway and Nasal Cannula  Additional Equipment:   Intra-op Plan:   Post-operative Plan:   Informed Consent: I have reviewed the patients History and Physical, chart, labs and discussed the procedure including the risks, benefits and alternatives for the proposed anesthesia with the patient or authorized representative who has indicated his/her understanding and acceptance.   Patient has DNR.  Discussed DNR with patient and Suspend DNR.   Dental advisory given  Plan Discussed with: CRNA and Anesthesiologist  Anesthesia Plan Comments:         Anesthesia Quick Evaluation

## 2020-09-26 NOTE — Interval H&P Note (Signed)
History and Physical Interval Note:  09/26/2020 8:57 AM  Darci Current  has presented today for surgery, with the diagnosis of Drop in Hgb and black stools; history of gastric ulcer.  The various methods of treatment have been discussed with the patient and family. After consideration of risks, benefits and other options for treatment, the patient has consented to  Procedure(s): ESOPHAGOGASTRODUODENOSCOPY (EGD) WITH PROPOFOL (N/A) as a surgical intervention.  The patient's history has been reviewed, patient examined, no change in status, stable for surgery.  I have reviewed the patient's chart and labs.  Questions were answered to the patient's satisfaction.     Pricilla Riffle. Fuller Plan

## 2020-09-26 NOTE — Anesthesia Postprocedure Evaluation (Signed)
Anesthesia Post Note  Patient: Shahzain Kiester  Procedure(s) Performed: ESOPHAGOGASTRODUODENOSCOPY (EGD) WITH PROPOFOL (N/A ) BIOPSY HEMOSTASIS CONTROL     Patient location during evaluation: PACU Anesthesia Type: MAC Level of consciousness: awake and alert and oriented Pain management: pain level controlled Vital Signs Assessment: post-procedure vital signs reviewed and stable Respiratory status: spontaneous breathing, nonlabored ventilation and respiratory function stable Cardiovascular status: stable and blood pressure returned to baseline Postop Assessment: no apparent nausea or vomiting Anesthetic complications: no   No complications documented.  Last Vitals:  Vitals:   09/26/20 0953 09/26/20 1003  BP: (!) 91/52 (!) 109/31  Pulse: 76 74  Resp: 14 15  Temp:    SpO2: 99% 98%    Last Pain:  Vitals:   09/26/20 1003  TempSrc:   PainSc: 0-No pain                 Emillie Chasen A.

## 2020-09-26 NOTE — Progress Notes (Signed)
[place] Progress Note Patient Name: Edward Wells DOB: 1946-05-15 MRN: [id number]   Date of Service  09/26/2020  HPI/Events of Note  NPO from mid night for EGD,  on solucortef also. BG elevated BG at 275.  Has ACHS.   eICU Interventions  Lispro 3 units SQ for now. And follow BG at 7.30 AM     Intervention Category Intermediate Interventions: Other:  Elmer Sow 09/26/2020, 4:45 AM

## 2020-09-26 NOTE — Progress Notes (Addendum)
Physical Therapy Treatment Patient Details Name: Edward Wells MRN: [id number] DOB: 1945/11/27 Today's Date: 09/26/2020    History of Present Illness 74 yo who presented with septic shock from pyelonephritis, later undergoing cystoscopy and R ureteral stent placement 12/15. Transferred to ICU after rapid response and hypotensive emergencyy on 12/20. s/p EGD 12/23    PT Comments    The patient ambulated x 150' using Rw and min guard assistance. Patient reports feeling quite tired. Patient's HR max 127. Saturation on RA ? Dropped to 87% on RA.Marland Kitchen Resting 92% on RA. Placed back on 2 L. Patient's niece present and confirms  Plan at [place] with  HHPT.    Follow Up Recommendations   HHPT     Equipment Recommendations  Rolling walker with 5" wheels    Recommendations for Other Services       Precautions / Restrictions Precautions Precautions: ICD/Pacemaker Precaution Comments: monitor HR Restrictions Weight Bearing Restrictions: No    Mobility  Bed Mobility Overal bed mobility: Needs Assistance Bed Mobility: Supine to Sit     Supine to sit: Min guard        Transfers Overall transfer level: Needs assistance Equipment used: Rolling walker (2 wheeled) Transfers: Sit to/from Stand Sit to Stand: Min guard;From elevated surface         General transfer comment: Min guard for safety. Cues for safety, hand placement. Increased time. Ambulated in hall - HR up to 130 and sat down to 88% on RA. Fatigued after ambulation and unable to stand for grooming task.  Ambulation/Gait   Gait Distance (Feet): 150 Feet Assistive device: Rolling walker (2 wheeled) Gait Pattern/deviations: Step-through pattern;Decreased stride length;Step-to pattern Gait velocity: decr   General Gait Details: Min guard for safety. Slow gait speed. patient with noted increased RR and  HR near end of walk. patient stated that he was very tired.   Stairs             Wheelchair Mobility    Modified  Rankin (Stroke Patients Only)       Balance Overall balance assessment: Needs assistance Sitting-balance support: No upper extremity supported;Feet supported Sitting balance-Leahy Scale: Good     Standing balance support: Bilateral upper extremity supported;During functional activity Standing balance-Leahy Scale: Fair Standing balance comment: reliant on walker                            Cognition Arousal/Alertness: Awake/alert Behavior During Therapy: WFL for tasks assessed/performed Overall Cognitive Status: Within Functional Limits for tasks assessed                                        Exercises      General Comments        Pertinent Vitals/Pain Pain Assessment: No/denies pain    Home Living                      Prior Function            PT Goals (current goals can now be found in the care plan section) Acute Rehab PT Goals Patient Stated Goal: to get stronger Progress towards PT goals: Progressing toward goals    Frequency           PT Plan Current plan remains appropriate    Co-evaluation   Reason for Co-Treatment: To address functional/ADL transfers  PT goals addressed during session: Mobility/safety with mobility OT goals addressed during session: ADL's and self-care      AM-PAC PT "6 Clicks" Mobility   Outcome Measure  Help needed turning from your back to your side while in a flat bed without using bedrails?: A Little Help needed moving from lying on your back to sitting on the side of a flat bed without using bedrails?: A Little Help needed moving to and from a bed to a chair (including a wheelchair)?: A Little Help needed standing up from a chair using your arms (e.g., wheelchair or bedside chair)?: A Little Help needed to walk in hospital room?: A Little Help needed climbing 3-5 steps with a railing? : A Lot 6 Click Score: 17    End of Session Equipment Utilized During Treatment: Gait  belt Activity Tolerance: Patient tolerated treatment well Patient left: in chair;with call bell/phone within reach Nurse Communication: Mobility status PT Visit Diagnosis: Muscle weakness (generalized) (M62.81)     Time: 1130-1150 PT Time Calculation (min) (ACUTE ONLY): 20 min  Charges:  $Gait Training: 8-22 mins                     [place] Pager [phone] Office [phone]    Claretha Cooper 09/26/2020, 4:04 PM

## 2020-09-26 NOTE — Progress Notes (Signed)
Occupational Therapy Treatment Patient Details Name: Edward Wells MRN: [id number] DOB: [birth year] Today's Date: 09/26/2020    History of present illness 74 yo who presented with septic shock from pyelonephritis, later undergoing cystoscopy and R ureteral stent placement 12/15. Transferred to ICU after rapid response and hypotensive emergencyy on 12/20. s/p EGD 12/23   OT comments  Patient had a medical set back on 12/15 since his evaluation on 12/18 resulting in transfer to ICU. Today patient demonstrates a decline in functional abilities - needing more assistance with toileting, LB ADLs, and unable to stand for ADLs. Patient able to ambulate in hall with RW but fatigued quickly. Patient's POC continues to be appropriate with end goal of [facility] therapy.    Follow Up Recommendations  Home health OT    Equipment Recommendations  Toilet rise with handles    Recommendations for Other Services      Precautions / Restrictions Precautions Precautions: ICD/Pacemaker Restrictions Weight Bearing Restrictions: No       Mobility Bed Mobility Overal bed mobility: Needs Assistance Bed Mobility: Supine to Sit     Supine to sit: Min guard        Transfers Overall transfer level: Needs assistance Equipment used: Rolling walker (2 wheeled) Transfers: Sit to/from Stand Sit to Stand: Min guard;From elevated surface         General transfer comment: Min guard for safety. Cues for safety, hand placement. Increased time. Ambulated in hall - HR up to 130 and sat down to 88% on RA. Fatigued after ambulation and unable to stand for grooming task.    Balance Overall balance assessment: Needs assistance Sitting-balance support: No upper extremity supported;Feet supported Sitting balance-Leahy Scale: Good     Standing balance support: Bilateral upper extremity supported Standing balance-Leahy Scale: Poor Standing balance comment: reliant on walker                           ADL  either performed or assessed with clinical judgement   ADL       Grooming: Sitting Grooming Details (indicate cue type and reason): performed grooming task seated in recliner. Patient fatigued after ambulation.                 Toilet Transfer: Maximal Print production planner Details (indicate cue type and reason): total assist to don socks today.                 Vision Baseline Vision/History: Wears glasses Wears Glasses: Reading only Patient Visual Report: No change from baseline     Perception     Praxis      Cognition Arousal/Alertness: Awake/alert Behavior During Therapy: WFL for tasks assessed/performed Overall Cognitive Status: Within Functional Limits for tasks assessed                                          Exercises     Shoulder Instructions       General Comments      Pertinent Vitals/ Pain       Pain Assessment: No/denies pain  Home Living                                          Prior Functioning/Environment  Frequency  Min 2X/week        Progress Toward Goals  OT Goals(current goals can now be found in the care plan section)  Progress towards OT goals: Not progressing toward goals - comment (Patient had a medical set back resulting in increased weakness. However, goals continue to be appropriate.)  Acute Rehab OT Goals Patient Stated Goal: to get stronger OT Goal Formulation: With patient Time For Goal Achievement: 10/05/20 Potential to Achieve Goals: Good  Plan Discharge plan remains appropriate    Co-evaluation    PT/OT/SLP Co-Evaluation/Treatment: Yes Reason for Co-Treatment: To address functional/ADL transfers PT goals addressed during session: Mobility/safety with mobility OT goals addressed during session: ADL's and self-care      AM-PAC OT "6 Clicks" Daily Activity     Outcome Measure   Help from another person eating meals?: A Little Help from another  person taking care of personal grooming?: A Little Help from another person toileting, which includes using toliet, bedpan, or urinal?: A Lot Help from another person bathing (including washing, rinsing, drying)?: A Lot Help from another person to put on and taking off regular upper body clothing?: A Little Help from another person to put on and taking off regular lower body clothing?: A Lot 6 Click Score: 15    End of Session Equipment Utilized During Treatment: Rolling walker  OT Visit Diagnosis: Muscle weakness (generalized) (M62.81)   Activity Tolerance Patient limited by fatigue   Patient Left in chair;with call bell/phone within reach;with family/visitor present;with nursing/sitter in room   Nurse Communication Mobility status        Time: 1130-1155 OT Time Calculation (min): 25 min  Charges: OT General Charges $OT Visit: 1 Visit OT Treatments $Self Care/Home Management : 8-22 mins  Derl Barrow, OTR/L [place]  Office [phone] Pager: [place] 09/26/2020, 2:58 PM

## 2020-09-26 NOTE — Progress Notes (Addendum)
Initial Nutrition Assessment  INTERVENTION:   -Prosource Plus PO BID, each provides 100 kcals and 15g protein  Once diet advanced: -Resume Glucerna Shake po TID, each supplement provides 220 kcal and 10 grams of protein  NUTRITION DIAGNOSIS:   Increased nutrient needs related to acute illness (UTI, sepsis) as evidenced by estimated needs.  GOAL:   Patient will meet greater than or equal to 90% of their needs  MONITOR:   PO intake,Diet advancement,Supplement acceptance,Labs,Weight trends,I & O's  REASON FOR ASSESSMENT:   Consult Assessment of nutrition requirement/status  ASSESSMENT:   74 y/o male brought to the ED on 12/14 after he was not answering his neighbor's calls, noted to be in septic shock due to pyelonephritis. He underwent cystoscopy with right ureteral stent placement. Returned to ICU in shock, AFwRVR, AKI due to obstructing stones.  Cultures positive for E-Coli bacteremia.  Transferred to medicine service 12/17.  Returned back to ICU with AMS and hypotension on 12/21 early am - possible slow GI loss / anemia vs sepsis vs hypovolemia.  12/14: admitted 12/15: s/p cystoscopy  Patient in endoscopy this morning for EGD.  Per chart review, pt was NPO yesterday in anticipation of procedure this morning.  Pt was consuming 0-10% of meals on 12/19 when on CHO modified diet. Best day of PO was 12/18 (75% meal completion).  Glucerna shakes were ordered for pt, would resume once diet is past clears. Will order Prosource while on clears.   Admission weight: 209 lbs. Current weight: 211 lbs. Per weight records, pt has lost 25 lbs since 6/6 (10% wt loss x 6.5 months, insignificant for time frame).  Per nursing documentation, pt with mild UE and LE edema.   Medications: Folic acid, Multivitamin with minerals daily, KLOR-CON, Thiamine, D5 infusion, Lactated ringers, IV Mg sulfate  Labs reviewed: CBGs: 195-249 Low Mg  NUTRITION - FOCUSED PHYSICAL EXAM:  In  endoscopy  Diet Order:   Diet Order            Diet clear liquid Room service appropriate? Yes; Fluid consistency: Thin  Diet effective now           Diet NPO time specified  Diet effective midnight                 EDUCATION NEEDS:   No education needs have been identified at this time  Skin:  Skin Assessment: Reviewed RN Assessment  Last BM:  12/22 -type 6  Height:   Ht Readings from Last 1 Encounters:  09/26/20 6' (1.829 m)    Weight:   Wt Readings from Last 1 Encounters:  09/26/20 96.1 kg   BMI:  Body mass index is 28.73 kg/m.  Estimated Nutritional Needs:   Kcal:  2100-2300  Protein:  90-105g  Fluid:  2.1L/day  Clayton Bibles, MS, RD, LDN Inpatient Clinical Dietitian Contact information available via Amion

## 2020-09-27 DIAGNOSIS — K250 Acute gastric ulcer with hemorrhage: Secondary | ICD-10-CM

## 2020-09-27 DIAGNOSIS — E2740 Unspecified adrenocortical insufficiency: Secondary | ICD-10-CM

## 2020-09-27 DIAGNOSIS — N179 Acute kidney failure, unspecified: Secondary | ICD-10-CM

## 2020-09-27 LAB — BASIC METABOLIC PANEL
Anion gap: 11 (ref 5–15)
BUN: 16 mg/dL (ref 8–23)
CO2: 19 mmol/L — ABNORMAL LOW (ref 22–32)
Calcium: 8.4 mg/dL — ABNORMAL LOW (ref 8.9–10.3)
Chloride: 113 mmol/L — ABNORMAL HIGH (ref 98–111)
Creatinine, Ser: 1.24 mg/dL (ref 0.61–1.24)
GFR, Estimated: >60 mL/min (ref >60)
Glucose, Bld: 184 mg/dL — ABNORMAL HIGH (ref 70–99)
Potassium: 4 mmol/L (ref 3.5–5.1)
Sodium: 143 mmol/L (ref 135–145)

## 2020-09-27 LAB — CBC WITH DIFFERENTIAL/PLATELET
Abs Immature Granulocytes: 0.03 10*3/uL (ref 0.00–0.07)
Basophils Absolute: 0 10*3/uL (ref 0.0–0.1)
Basophils Relative: 0 %
Eosinophils Absolute: 0 10*3/uL (ref 0.0–0.5)
Eosinophils Relative: 0 %
HCT: 26.1 % — ABNORMAL LOW (ref 39.0–52.0)
Hemoglobin: 7.9 g/dL — ABNORMAL LOW (ref 13.0–17.0)
Immature Granulocytes: 0 %
Lymphocytes Relative: 10 %
Lymphs Abs: 0.8 10*3/uL (ref 0.7–4.0)
MCH: 26.2 pg (ref 26.0–34.0)
MCHC: 30.3 g/dL (ref 30.0–36.0)
MCV: 86.7 fL (ref 80.0–100.0)
Monocytes Absolute: 0.3 10*3/uL (ref 0.1–1.0)
Monocytes Relative: 4 %
Neutro Abs: 7.1 10*3/uL (ref 1.7–7.7)
Neutrophils Relative %: 86 %
Platelets: 132 10*3/uL — ABNORMAL LOW (ref 150–400)
RBC: 3.01 MIL/uL — ABNORMAL LOW (ref 4.22–5.81)
RDW: 17.4 % — ABNORMAL HIGH (ref 11.5–15.5)
WBC: 8.3 10*3/uL (ref 4.0–10.5)
nRBC: 0 % (ref 0.0–0.2)

## 2020-09-27 LAB — MAGNESIUM: Magnesium: 2.1 mg/dL (ref 1.7–2.4)

## 2020-09-27 LAB — GLUCOSE, CAPILLARY
Glucose-Capillary: 172 mg/dL — ABNORMAL HIGH (ref 70–99)
Glucose-Capillary: 210 mg/dL — ABNORMAL HIGH (ref 70–99)
Glucose-Capillary: 174 mg/dL — ABNORMAL HIGH (ref 70–99)
Glucose-Capillary: 96 mg/dL (ref 70–99)

## 2020-09-27 LAB — HEPARIN LEVEL (UNFRACTIONATED): Heparin Unfractionated: 0.61 IU/mL (ref 0.30–0.70)

## 2020-09-27 MED ORDER — HYDROCORTISONE NA SUCCINATE PF 100 MG IJ SOLR
50.0000 mg | Freq: Two times a day (BID) | INTRAMUSCULAR | Status: DC
  Administered 2020-09-27 – 2020-09-28 (×2): 50 mg via INTRAVENOUS
  Filled 2020-09-27 (×2): qty 2

## 2020-09-27 MED ORDER — HEPARIN (PORCINE) 25000 UT/250ML-% IV SOLN
1650.0000 [IU]/h | INTRAVENOUS | Status: DC
  Administered 2020-09-27 – 2020-09-28 (×2): 1650 [IU]/h via INTRAVENOUS
  Filled 2020-09-27 (×2): qty 250

## 2020-09-27 NOTE — Progress Notes (Deleted)
PTs oxygen level drops to the 60s while sleeping periodically. PT refused oxygen.

## 2020-09-27 NOTE — Progress Notes (Signed)
     [name] Progress Note  CC:  UGIB, gastric ulcers  Subjective:  Feels good.  No further black stools.  No abdominal pain.    EGD 12/23 showed the following:  - Normal esophagus. - One non-bleeding gastric ulcer on lesser curvature with pigmented raised material. Treated with bipolar cautery. - Three non-bleeding gastric antral ulcers with no stigmata of bleeding. - Severe erosive gastropathy with no bleeding and no stigmata of recent bleeding. Biopsied. - Duodenal erosions without bleeding.  Objective:  Vital signs in last 24 hours: Temp:  [97.6 F (36.4 C)-98.2 F (36.8 C)] 98.1 F (36.7 C) (12/24 0750) Pulse Rate:  [65-100] 73 (12/24 0800) Resp:  [10-20] 10 (12/24 0800) BP: (90-155)/(21-103) 116/54 (12/24 0800) SpO2:  [94 %-100 %] 100 % (12/24 0800) Last BM Date: 09/26/20 General:  Alert, Well-developed, in NAD Heart:  Regular rate and rhythm; no murmurs Pulm:  CTAB.  No W/R/R. Abdomen:  Soft, non-distended.  BS present.  Non-tender.  Extremities:  Without edema. Neurologic:  Alert and oriented x 4;  grossly normal neurologically. Psych:  Alert and cooperative. Normal mood and affect.  Intake/Output from previous day: 12/23 0701 - 12/24 0700 In: 1287.6 [I.V.:902.8; IV Piggyback:384.7] Out: 950 [Urine:950]  Lab Results: Recent Labs    09/25/20 0629 09/26/20 0250 09/27/20 0241  WBC 10.4 8.6 8.3  HGB 7.5* 8.1* 7.9*  HCT 24.9* 26.2* 26.1*  PLT 132* 131* 132*   BMET Recent Labs    09/24/20 1505 09/25/20 0629 09/26/20 0250  NA 148* 144 141  K 3.6 3.6 4.0  CL 116* 114* 111  CO2 26 20* 20*  GLUCOSE 156* 179* 275*  BUN 30* 18 15  CREATININE 1.04 1.11 1.26*  CALCIUM 8.2* 8.0* 8.1*   LFT Recent Labs    09/25/20 0629  PROT 4.9*  ALBUMIN 2.2*  AST 26  ALT 32  ALKPHOS 42  BILITOT 0.5   Assessment / Plan: *Declining Hgb and black stools x 2 on 12/21, hemoccult positive:  Hgb 7.5 grams this AM from 8.3 grams yesterday.  Has slowly  been trending down entire hospitalization from normal Hgb of 13.8 grams on admission.  Received one unit PRBCs on 12/22 and Hgb is stable this AM at 7.9 grams. *History of GU in 2015, Hpylori negative then but then was positive on follow-up EGD with biopsy in 2016.  ? If he completed treatment.  EGD 12/23 with Dr. Fuller Plan showed severe erosive gastropathy and gastric ulcers, on with pigmented raised area that required cautery.  Biopsies pending. *Extensive DVT:  New this hospitalization.  Was started on IV heparin here. *Renal stone that had caused septic shock and Ecoli bacteremia:  Resolved/addressed by urology. *Elevated troponins:  PCCM thinks likely due to demand ischemia.  He denies chest pain. *Thrombocytopenia:  Improving.  Was likely due to sepsis.  -Continue PPI gtt for 72 hours from procedure on 12/23 then BID. -Will advance to full liquids today. -Follow-up biopsies.  If positive for Hpylori then treat at discharge. -Avoid NSAIDs. -Repeat EGD in 3 months. -Ok to resume heparin today with monitoring for recurrent signs of bleeding.   LOS: 9 days   Edward Wells. Edward Wells  09/27/2020, 8:44 AM

## 2020-09-27 NOTE — Progress Notes (Signed)
[facility] for IV heparin Indication: DVT  Allergies  Allergen Reactions  . Furosemide     Muscle cramps    Patient Measurements: Height: 6' (182.9 cm) Weight: 96.1 kg (211 lb 13.8 oz) IBW/kg (Calculated) : 77.6 Heparin Dosing Weight: 94.8 kg  Vital Signs: Temp: 98 F (36.7 C) (12/24 1200) Temp Source: Oral (12/24 1200) BP: 116/54 (12/24 0800) Pulse Rate: 73 (12/24 0800)  Labs: Recent Labs    09/24/20 1505 09/24/20 1518 09/24/20 1758 09/24/20 2239 09/25/20 0629 09/26/20 0250 09/27/20 0241  HGB  --    < >  --   --  7.5* 8.1* 7.9*  HCT  --    < >  --   --  24.9* 26.2* 26.1*  PLT  --    < >  --   --  132* 131* 132*  HEPARINUNFRC  --   --   --  0.40 0.54  --   --   CREATININE 1.04  --   --   --  1.11 1.26* 1.24  TROPONINIHS 885*  --  873*  --   --   --   --    < > = values in this interval not displayed.    Estimated Creatinine Clearance: 64.7 mL/min (by C-G formula based on SCr of 1.24 mg/dL).   Medical History: Past Medical History:  Diagnosis Date  . Acute gastric ulcer with hemorrhage 07/30/2014  . Hypertension   . Sleep apnea     Medications:  Medications Prior to Admission  Medication Sig Dispense Refill Last Dose  . acetaminophen (TYLENOL) 325 MG tablet Take 975 mg by mouth daily as needed for mild pain.   Past Month at Unknown time  . Coenzyme Q10 (CO Q 10) 100 MG CAPS Take 100 mg by mouth daily.   Past Week at Unknown time  . losartan (COZAAR) 50 MG tablet Take 50 mg by mouth daily.   Past Week at Unknown time  . Multiple Vitamin (MULTIVITAMIN) tablet Take 1 tablet by mouth daily.   Past Week at Unknown time  . tiZANidine (ZANAFLEX) 2 MG tablet Take 1 tablet by mouth 3 (three) times daily as needed for muscle spasms.   Past Week at Unknown time  . cefdinir (OMNICEF) 300 MG capsule Take 1 capsule (300 mg total) by mouth 2 (two) times daily. (Patient not taking: Reported on 09/18/2020) 28 capsule 0 Not Taking at  Unknown time   Scheduled:  . (feeding supplement) PROSource Plus  30 mL Oral BID BM  . Chlorhexidine Gluconate Cloth  6 each Topical Daily  . feeding supplement (GLUCERNA SHAKE)  237 mL Oral TID BM  . folic acid  1 mg Oral Daily  . hydrocortisone sod succinate (SOLU-CORTEF) inj  50 mg Intravenous Q12H  . insulin aspart  0-15 Units Subcutaneous TID WC  . insulin aspart  0-5 Units Subcutaneous QHS  . multivitamin with minerals  1 tablet Oral Daily  . [START ON 09/29/2020] pantoprazole  40 mg Intravenous Q12H  . potassium chloride  40 mEq Oral Once  . thiamine  100 mg Oral Daily   Infusions:  . sodium chloride Stopped (09/26/20 0812)  .  ceFAZolin (ANCEF) IV 2 g (09/27/20 1414)  . lactated ringers 10 mL/hr at 09/26/20 0903  . pantoprozole (PROTONIX) infusion 8 mg/hr (09/27/20 1251)   Assessment: 50 yoM admitted with sepsis d/t pyelonephritis. Noted to have RLE edema on 12/20 and LE doppler performed which revealed extensive R  DVT. Pharmacy consulted to dose heparin. Per discussion with MD, low platelets felt due primarily to sepsis and are showing good recovery at time of heparin initiation.   Significant events: 12/22: Heparin of for EGD, PRBC x 1 unit 12/23: EGD showed non bleeding gastric ulcer on lesser curvature s/p cautery, three non bleeding gastric antral ulcers with no bleeding.  12/24: Ok to resume heparin; Hgb low but stable; Plts low but stable.  Goal of Therapy: Heparin level 0.3-0.7 units/ml Monitor platelets by anticoagulation protocol: Yes  Plan:  Resume heparin (no bolus) 1650 units/hr per previous rate   Check heparin level in 8hrs  Daily CBC, daily heparin level once stable  Monitor for signs of bleeding or thrombosis  Peggyann Juba, PharmD, BCPS Pharmacy: [phone] 09/27/2020 2:19 PM

## 2020-09-27 NOTE — Progress Notes (Signed)
PROGRESS NOTE    Edward Wells  [id number] DOB: October 28, 1945 DOA: 09/17/2020 PCP: Elizabeth Palau, MD (Inactive)    Chief Complaint  Patient presents with  . Diarrhea    N/V/D    Brief Narrative:  74 y/o male brought to the ED on 12/14 Admitted for shock due to pyelonephritis, s/p  Right ureteral stent in 12/15, initially started on pressors, and weaned off. He was found to have e coli bacteremia. On 12/21 pt became hypotensive, was transferred to ICU , developed malena. Underwent EGD on 12/23 showing non bleeding gastric ulcer on lesser curvature s/p cautery, three non bleeding gastric antral ulcers with no bleeding.   Assessment & Plan:   Active Problems:   Anemia   Pyelonephritis   Acute unilateral obstructive uropathy   SIRS (systemic inflammatory response syndrome) (HCC)   Adrenal insufficiency (HCC)   Acute blood loss anemia   Melena   Anticoagulated  Recurrent Hypotension? Circulatory shock: Probably secondary to acute UGIB/ relative adrenal insufficiency super imposed on E Coli bacteremia in the setting of pyelonephritis with obstructive uropathy/ hydronephrosis secondary to nephrolithiasis s/p stent on 12/15. - taper stress dose steroids.  - continue with antibiotics to complete 14 day course.     Upper GI bleed with blood loss anemia; S/p EGd on 12/23 : non-bleeding gastric ulcer on lesser curvature with pigmented raised material.Treated with bipolar cautery. Three non-bleeding gastric antral ulcers with no stigmata of bleeding.Severe erosive gastropathy  Continue with PPI BID.  Appreciate gastroenterology recommendations.  Transfuse to keep hemoglobin greater than 7.   RLE DVT:  - resume heparin.    AKI;  Improved with hydration.    Thrombocytopenia:  Monitor.    Moderate Protein calorie malnutrition:  Supplementation added.    DVT prophylaxis: (scd's Code Status: (DNR) Family Communication: (none at bedside) Disposition:   Status is:  Inpatient  Remains inpatient appropriate because:Unsafe d/c plan, IV treatments appropriate due to intensity of illness or inability to take PO and Inpatient level of care appropriate due to severity of illness   Dispo: The patient is from: Home              Anticipated d/c is to: Home              Anticipated d/c date is: 2 days              Patient currently is not medically stable to d/c.       Consultants:   Gastroenterology  PCCM.  Urology   Procedures: EGD.  Antimicrobials:  Antibiotics Given (last 72 hours)    Date/Time Action Medication Dose Rate   09/24/20 2300 New Bag/Given   cefTRIAXone (ROCEPHIN) 2 g in sodium chloride 0.9 % 100 mL IVPB 2 g 200 mL/hr   09/25/20 1339 New Bag/Given   ceFAZolin (ANCEF) IVPB 2g/100 mL premix 2 g 200 mL/hr   09/25/20 2239 New Bag/Given   ceFAZolin (ANCEF) IVPB 2g/100 mL premix 2 g 200 mL/hr   09/26/20 0501 New Bag/Given   ceFAZolin (ANCEF) IVPB 2g/100 mL premix 2 g 200 mL/hr   09/26/20 1422 New Bag/Given   ceFAZolin (ANCEF) IVPB 2g/100 mL premix 2 g 200 mL/hr   09/26/20 2148 New Bag/Given   ceFAZolin (ANCEF) IVPB 2g/100 mL premix 2 g 200 mL/hr   09/27/20 0555 New Bag/Given   ceFAZolin (ANCEF) IVPB 2g/100 mL premix 2 g 200 mL/hr           Subjective: NO new complaints.   Objective:  Vitals:   09/27/20 0800 09/27/20 1000 09/27/20 1100 09/27/20 1200  BP: (!) 116/54     Pulse: 73     Resp: 10 16 17 17   Temp:    98 F (36.7 C)  TempSrc:    Oral  SpO2: 100%     Weight:      Height:        Intake/Output Summary (Last 24 hours) at 09/27/2020 1329 Last data filed at 09/27/2020 1251 Gross per 24 hour  Intake 516.14 ml  Output 200 ml  Net 316.14 ml   Filed Weights   09/18/20 1400 09/23/20 0451 09/26/20 0830  Weight: 94.8 kg 96.1 kg 96.1 kg    Examination:  General exam: Appears calm and comfortable  Respiratory system: Clear to auscultation. Respiratory effort normal. Cardiovascular system: S1 & S2 heard,  RRR. No JVD, . No pedal edema. Gastrointestinal system: Abdomen is nondistended, soft and nontender.Normal bowel sounds heard. Central nervous system: Alert and oriented. No focal neurological deficits. Extremities: Symmetric 5 x 5 power. Skin: No rashes, lesions or ulcers Psychiatry:  Mood & affect appropriate.     Data Reviewed: I have personally reviewed following labs and imaging studies  CBC: Recent Labs  Lab 09/23/20 0509 09/23/20 2224 09/24/20 0111 09/24/20 1518 09/25/20 0629 09/26/20 0250 09/27/20 0241  WBC 9.4   < > 12.3* 10.3 10.4 8.6 8.3  NEUTROABS 7.4  --  9.9*  --  7.6 7.5 7.1  HGB 10.1*   < > 8.7* 8.3* 7.5* 8.1* 7.9*  HCT 33.6*   < > 28.5* 27.1* 24.9* 26.2* 26.1*  MCV 89.8   < > 88.2 88.3 88.9 85.6 86.7  PLT 96*   < > 118* 132* 132* 131* 132*   < > = values in this interval not displayed.    Basic Metabolic Panel: Recent Labs  Lab 09/22/20 0538 09/23/20 0509 09/24/20 0111 09/24/20 1505 09/25/20 0629 09/26/20 0250 09/27/20 0241  NA 153*   < > 146* 148* 144 141 143  K 4.0   < > 3.8 3.6 3.6 4.0 4.0  CL 117*   < > 114* 116* 114* 111 113*  CO2 26   < > 25 26 20* 20* 19*  GLUCOSE 203*   < > 211* 156* 179* 275* 184*  BUN 33*   < > 23 30* 18 15 16   CREATININE 1.18   < > 1.04 1.04 1.11 1.26* 1.24  CALCIUM 8.6*   < > 8.1* 8.2* 8.0* 8.1* 8.4*  MG 1.7  --  1.4*  --   --  1.6* 2.1   < > = values in this interval not displayed.    GFR: Estimated Creatinine Clearance: 64.7 mL/min (by C-G formula based on SCr of 1.24 mg/dL).  Liver Function Tests: Recent Labs  Lab 09/22/20 0538 09/23/20 0509 09/23/20 2224 09/24/20 0111 09/25/20 0629  AST 23 31 27 28 26   ALT 32 39 36 34 32  ALKPHOS 55 57 55 48 42  BILITOT 0.8 0.7 0.7 0.3 0.5  PROT 5.3* 6.1* 5.4* 5.0* 4.9*  ALBUMIN 2.3* 2.6* 2.5* 2.2* 2.2*    CBG: Recent Labs  Lab 09/26/20 1217 09/26/20 1600 09/26/20 2152 09/27/20 0726 09/27/20 1241  GLUCAP 194* 241* 170* 172* 210*     Recent Results  (from the past 240 hour(s))  Blood Culture (routine x 2)     Status: Abnormal   Collection Time: 09/17/20 11:14 PM   Specimen: BLOOD  Result Value Ref Range Status  Specimen Description   Final    BLOOD RIGHT ANTECUBITAL Performed at [facility] [address] [place], [place] [address]    Special Requests   Final    BOTTLES DRAWN AEROBIC AND ANAEROBIC Blood Culture adequate volume Performed at [facility] [address] [place], [place] [address]    Culture  Setup Time   Final    GRAM NEGATIVE RODS IN BOTH AEROBIC AND ANAEROBIC BOTTLES Organism ID to follow CRITICAL RESULT CALLED TO, READ BACK BY AND VERIFIED WITHKeturah Barre [facility] PHARMD 1619 09/17/20 A BROWNING Performed at [facility], [place] [address], [place], [place] [address]    Culture ESCHERICHIA COLI (A)  Final   Report Status 09/20/2020 FINAL  Final   Organism ID, Bacteria ESCHERICHIA COLI  Final      Susceptibility   Escherichia coli - MIC*    AMPICILLIN 4 SENSITIVE Sensitive     CEFAZOLIN <=4 SENSITIVE Sensitive     CEFEPIME <=0.12 SENSITIVE Sensitive     CEFTAZIDIME <=1 SENSITIVE Sensitive     CEFTRIAXONE <=0.25 SENSITIVE Sensitive     CIPROFLOXACIN <=0.25 SENSITIVE Sensitive     GENTAMICIN <=1 SENSITIVE Sensitive     IMIPENEM <=0.25 SENSITIVE Sensitive     TRIMETH/SULFA <=20 SENSITIVE Sensitive     AMPICILLIN/SULBACTAM <=2 SENSITIVE Sensitive     PIP/TAZO <=4 SENSITIVE Sensitive     * ESCHERICHIA COLI  Blood Culture (routine x 2)     Status: Abnormal   Collection Time: 09/17/20 11:14 PM   Specimen: BLOOD  Result Value Ref Range Status   Specimen Description   Final    BLOOD LEFT ANTECUBITAL Performed at [facility] [address], [place], [place] [address]    Special Requests   Final    BOTTLES DRAWN AEROBIC AND ANAEROBIC Blood Culture adequate volume Performed at [facility] [address] [place], [place] [address]     Culture  Setup Time   Final    GRAM NEGATIVE RODS IN BOTH AEROBIC AND ANAEROBIC BOTTLES CRITICAL VALUE NOTED.  VALUE IS CONSISTENT WITH PREVIOUSLY REPORTED AND CALLED VALUE.    Culture (A)  Final    ESCHERICHIA COLI SUSCEPTIBILITIES PERFORMED ON PREVIOUS CULTURE WITHIN THE LAST 5 DAYS. Performed at [facility], [place] [address], [place],  [address]    Report Status 09/20/2020 FINAL  Final  Blood Culture ID Panel (Reflexed)     Status: Abnormal   Collection Time: 09/17/20 11:14 PM  Result Value Ref Range Status   Enterococcus faecalis NOT DETECTED NOT DETECTED Final   Enterococcus Faecium NOT DETECTED NOT DETECTED Final   Listeria monocytogenes NOT DETECTED NOT DETECTED Final   Staphylococcus species NOT DETECTED NOT DETECTED Final   Staphylococcus aureus (BCID) NOT DETECTED NOT DETECTED Final   Staphylococcus epidermidis NOT DETECTED NOT DETECTED Final   Staphylococcus lugdunensis NOT DETECTED NOT DETECTED Final   Streptococcus species NOT DETECTED NOT DETECTED Final   Streptococcus agalactiae NOT DETECTED NOT DETECTED Final   Streptococcus pneumoniae NOT DETECTED NOT DETECTED Final   Streptococcus pyogenes NOT DETECTED NOT DETECTED Final   A.calcoaceticus-baumannii NOT DETECTED NOT DETECTED Final   Bacteroides fragilis NOT DETECTED NOT DETECTED Final   Enterobacterales DETECTED (A) NOT DETECTED Final    Comment: Enterobacterales represent a large order of gram negative bacteria, not a single organism. CRITICAL RESULT CALLED TO, READ BACK BY AND VERIFIED WITH: D [facility] PHARMD 1619 09/17/20 A BROWNING  Enterobacter cloacae complex NOT DETECTED NOT DETECTED Final   Escherichia coli DETECTED (A) NOT DETECTED Final    Comment: CRITICAL RESULT CALLED TO, READ BACK BY AND VERIFIED WITH: D [facility] PHARMD 1619 09/17/20 A BROWNING    Klebsiella aerogenes NOT DETECTED NOT DETECTED Final   Klebsiella oxytoca NOT DETECTED NOT DETECTED Final   Klebsiella pneumoniae NOT  DETECTED NOT DETECTED Final   Proteus species NOT DETECTED NOT DETECTED Final   Salmonella species NOT DETECTED NOT DETECTED Final   Serratia marcescens NOT DETECTED NOT DETECTED Final   Haemophilus influenzae NOT DETECTED NOT DETECTED Final   Neisseria meningitidis NOT DETECTED NOT DETECTED Final   Pseudomonas aeruginosa NOT DETECTED NOT DETECTED Final   Stenotrophomonas maltophilia NOT DETECTED NOT DETECTED Final   Candida albicans NOT DETECTED NOT DETECTED Final   Candida auris NOT DETECTED NOT DETECTED Final   Candida glabrata NOT DETECTED NOT DETECTED Final   Candida krusei NOT DETECTED NOT DETECTED Final   Candida parapsilosis NOT DETECTED NOT DETECTED Final   Candida tropicalis NOT DETECTED NOT DETECTED Final   Cryptococcus neoformans/gattii NOT DETECTED NOT DETECTED Final   CTX-M ESBL NOT DETECTED NOT DETECTED Final   Carbapenem resistance IMP NOT DETECTED NOT DETECTED Final   Carbapenem resistance KPC NOT DETECTED NOT DETECTED Final   Carbapenem resistance NDM NOT DETECTED NOT DETECTED Final   Carbapenem resist OXA 48 LIKE NOT DETECTED NOT DETECTED Final   Carbapenem resistance VIM NOT DETECTED NOT DETECTED Final    Comment: Performed at [facility], [address], [place], [place] [address]  Resp Panel by RT-PCR (Flu A&B, Covid) Nasopharyngeal Swab     Status: None   Collection Time: 09/17/20 11:37 PM   Specimen: Nasopharyngeal Swab; Nasopharyngeal(NP) swabs in vial transport medium  Result Value Ref Range Status   SARS Coronavirus 2 by RT PCR NEGATIVE NEGATIVE Final    Comment: (NOTE) SARS-CoV-2 target nucleic acids are NOT DETECTED.  The SARS-CoV-2 RNA is generally detectable in upper respiratory specimens during the acute phase of infection. The lowest concentration of SARS-CoV-2 viral copies this assay can detect is 138 copies/mL. A negative result does not preclude SARS-Cov-2 infection and should not be used as the sole basis for treatment or other patient  management decisions. A negative result may occur with  improper specimen collection/handling, submission of specimen other than nasopharyngeal swab, presence of viral mutation(s) within the areas targeted by this assay, and inadequate number of viral copies(<138 copies/mL). A negative result must be combined with clinical observations, patient history, and epidemiological information. The expected result is Negative.  Fact Sheet for Patients:  EntrepreneurPulse.com.au  Fact Sheet for Healthcare Providers:  IncredibleEmployment.be  This test is no t yet approved or cleared by the [place] FDA and  has been authorized for detection and/or diagnosis of SARS-CoV-2 by FDA under an Emergency Use Authorization (EUA). This EUA will remain  in effect (meaning this test can be used) for the duration of the COVID-19 declaration under Section 564(b)(1) of the Act, 21 U.S.C.section 360bbb-3(b)(1), unless the authorization is terminated  or revoked sooner.       Influenza A by PCR NEGATIVE NEGATIVE Final   Influenza B by PCR NEGATIVE NEGATIVE Final    Comment: (NOTE) The Xpert Xpress SARS-CoV-2/FLU/RSV plus assay is intended as an aid in the diagnosis of influenza from Nasopharyngeal swab specimens and should not be used as a sole basis for treatment. Nasal washings and aspirates are unacceptable for Xpert Xpress SARS-CoV-2/FLU/RSV testing.  Fact Sheet for Patients: EntrepreneurPulse.com.au  Fact Sheet for Healthcare Providers: IncredibleEmployment.be  This test is not yet approved or cleared by the [place] FDA and has been authorized for detection and/or diagnosis of SARS-CoV-2 by FDA under an Emergency Use Authorization (EUA). This EUA will remain in effect (meaning this test can be used) for the duration of the COVID-19 declaration under Section 564(b)(1) of the Act, 21 U.S.C. section 360bbb-3(b)(1),  unless the authorization is terminated or revoked.  Performed at [facility], [place] [address] [place], [place] [address]   Urine culture     Status: Abnormal   Collection Time: 09/18/20 12:40 AM   Specimen: In/Out Cath Urine  Result Value Ref Range Status   Specimen Description   Final    IN/OUT CATH URINE Performed at [facility] [address] [place], [place] [address]    Special Requests   Final    NONE Performed at [facility], [place] [address], [place], [place] [address]    Culture >=100,000 COLONIES/mL ESCHERICHIA COLI (A)  Final   Report Status 09/20/2020 FINAL  Final   Organism ID, Bacteria ESCHERICHIA COLI (A)  Final      Susceptibility   Escherichia coli - MIC*    AMPICILLIN 4 SENSITIVE Sensitive     CEFAZOLIN <=4 SENSITIVE Sensitive     CEFEPIME <=0.12 SENSITIVE Sensitive     CEFTRIAXONE <=0.25 SENSITIVE Sensitive     CIPROFLOXACIN <=0.25 SENSITIVE Sensitive     GENTAMICIN <=1 SENSITIVE Sensitive     IMIPENEM <=0.25 SENSITIVE Sensitive     NITROFURANTOIN <=16 SENSITIVE Sensitive     TRIMETH/SULFA <=20 SENSITIVE Sensitive     AMPICILLIN/SULBACTAM <=2 SENSITIVE Sensitive     PIP/TAZO <=4 SENSITIVE Sensitive     * >=100,000 COLONIES/mL ESCHERICHIA COLI  MRSA PCR Screening     Status: None   Collection Time: 09/18/20  1:40 PM   Specimen: Nasopharyngeal  Result Value Ref Range Status   MRSA by PCR NEGATIVE NEGATIVE Final    Comment:        The GeneXpert MRSA Assay (FDA approved for NASAL specimens only), is one component of a comprehensive MRSA colonization surveillance program. It is not intended to diagnose MRSA infection nor to guide or monitor treatment for MRSA infections. Performed at [facility], [place] [address], [place], [place] [address]          Radiology Studies: No results found.      Scheduled Meds: . (feeding supplement) PROSource Plus  30 mL Oral BID  BM  . Chlorhexidine Gluconate Cloth  6 each Topical Daily  . feeding supplement (GLUCERNA SHAKE)  237 mL Oral TID BM  . folic acid  1 mg Oral Daily  . hydrocortisone sod succinate (SOLU-CORTEF) inj  50 mg Intravenous Q8H  . insulin aspart  0-15 Units Subcutaneous TID WC  . insulin aspart  0-5 Units Subcutaneous QHS  . multivitamin with minerals  1 tablet Oral Daily  . [START ON 09/29/2020] pantoprazole  40 mg Intravenous Q12H  . potassium chloride  40 mEq Oral Once  . thiamine  100 mg Oral Daily   Continuous Infusions: . sodium chloride Stopped (09/26/20 0812)  .  ceFAZolin (ANCEF) IV Stopped (09/27/20 [id number])  . lactated ringers 10 mL/hr at 09/26/20 0903  . pantoprozole (PROTONIX) infusion 8 mg/hr (09/27/20 1251)     LOS: 9 days       Hosie Poisson, MD [facility]   To  contact the attending provider between 7A-7P or the covering provider during after hours 7P-7A, please log into the web site [email or web address] and access using universal [place] password for that web site. If you do not have the password, please call the hospital operator.  09/27/2020, 1:29 PM

## 2020-09-28 ENCOUNTER — Encounter (HOSPITAL_COMMUNITY): Payer: Self-pay | Admitting: Gastroenterology

## 2020-09-28 LAB — URINALYSIS, ROUTINE W REFLEX MICROSCOPIC
Bacteria, UA: NONE SEEN
Bilirubin Urine: NEGATIVE
Glucose, UA: 50 mg/dL — AB
Ketones, ur: NEGATIVE mg/dL
Nitrite: NEGATIVE
Protein, ur: 100 mg/dL — AB
RBC / HPF: >50 RBC/hpf — ABNORMAL HIGH (ref 0–5)
Specific Gravity, Urine: 1.014 (ref 1.005–1.030)
pH: 6 (ref 5.0–8.0)

## 2020-09-28 LAB — CBC WITH DIFFERENTIAL/PLATELET
Abs Immature Granulocytes: 0.05 10*3/uL (ref 0.00–0.07)
Basophils Absolute: 0 10*3/uL (ref 0.0–0.1)
Basophils Relative: 0 %
Eosinophils Absolute: 0 10*3/uL (ref 0.0–0.5)
Eosinophils Relative: 1 %
HCT: 25 % — ABNORMAL LOW (ref 39.0–52.0)
Hemoglobin: 7.6 g/dL — ABNORMAL LOW (ref 13.0–17.0)
Immature Granulocytes: 1 %
Lymphocytes Relative: 26 %
Lymphs Abs: 1.9 10*3/uL (ref 0.7–4.0)
MCH: 26.4 pg (ref 26.0–34.0)
MCHC: 30.4 g/dL (ref 30.0–36.0)
MCV: 86.8 fL (ref 80.0–100.0)
Monocytes Absolute: 0.6 10*3/uL (ref 0.1–1.0)
Monocytes Relative: 8 %
Neutro Abs: 4.6 10*3/uL (ref 1.7–7.7)
Neutrophils Relative %: 64 %
Platelets: 148 10*3/uL — ABNORMAL LOW (ref 150–400)
RBC: 2.88 MIL/uL — ABNORMAL LOW (ref 4.22–5.81)
RDW: 17.7 % — ABNORMAL HIGH (ref 11.5–15.5)
WBC: 7.1 10*3/uL (ref 4.0–10.5)
nRBC: 0 % (ref 0.0–0.2)

## 2020-09-28 LAB — BASIC METABOLIC PANEL
Anion gap: 6 (ref 5–15)
BUN: 18 mg/dL (ref 8–23)
CO2: 23 mmol/L (ref 22–32)
Calcium: 8.1 mg/dL — ABNORMAL LOW (ref 8.9–10.3)
Chloride: 111 mmol/L (ref 98–111)
Creatinine, Ser: 1.18 mg/dL (ref 0.61–1.24)
GFR, Estimated: >60 mL/min (ref >60)
Glucose, Bld: 151 mg/dL — ABNORMAL HIGH (ref 70–99)
Potassium: 3.4 mmol/L — ABNORMAL LOW (ref 3.5–5.1)
Sodium: 140 mmol/L (ref 135–145)

## 2020-09-28 LAB — GLUCOSE, CAPILLARY
Glucose-Capillary: 140 mg/dL — ABNORMAL HIGH (ref 70–99)
Glucose-Capillary: 120 mg/dL — ABNORMAL HIGH (ref 70–99)
Glucose-Capillary: 189 mg/dL — ABNORMAL HIGH (ref 70–99)
Glucose-Capillary: 87 mg/dL (ref 70–99)

## 2020-09-28 LAB — HEPARIN LEVEL (UNFRACTIONATED)
Heparin Unfractionated: 1.1 IU/mL — ABNORMAL HIGH (ref 0.30–0.70)
Heparin Unfractionated: 0.79 IU/mL — ABNORMAL HIGH (ref 0.30–0.70)

## 2020-09-28 MED ORDER — HEPARIN (PORCINE) 25000 UT/250ML-% IV SOLN
1250.0000 [IU]/h | INTRAVENOUS | Status: AC
  Administered 2020-09-29: 02:00:00 1250 [IU]/h via INTRAVENOUS
  Filled 2020-09-28 (×3): qty 250

## 2020-09-28 MED ORDER — HEPARIN (PORCINE) 25000 UT/250ML-% IV SOLN
1400.0000 [IU]/h | INTRAVENOUS | Status: DC
  Administered 2020-09-28: 11:00:00 1400 [IU]/h via INTRAVENOUS

## 2020-09-28 MED ORDER — POTASSIUM CHLORIDE CRYS ER 20 MEQ PO TBCR
40.0000 meq | EXTENDED_RELEASE_TABLET | Freq: Once | ORAL | Status: AC
  Administered 2020-09-28: 11:00:00 40 meq via ORAL
  Filled 2020-09-28: qty 2

## 2020-09-28 NOTE — Progress Notes (Signed)
[place] for IV heparin Indication: DVT  Allergies  Allergen Reactions  . Furosemide     Muscle cramps    Patient Measurements: Height: 6' (182.9 cm) Weight: 96.1 kg (211 lb 13.8 oz) IBW/kg (Calculated) : 77.6 Heparin Dosing Weight: 94.8 kg  Vital Signs: Temp: 97.4 F (36.3 C) (12/24 2000) Temp Source: Oral (12/24 2000) BP: 164/78 (12/24 2100) Pulse Rate: 71 (12/24 2100)  Labs: Recent Labs    09/25/20 0629 09/26/20 0250 09/27/20 0241 09/27/20 2307  HGB 7.5* 8.1* 7.9*  --   HCT 24.9* 26.2* 26.1*  --   PLT 132* 131* 132*  --   HEPARINUNFRC 0.54  --   --  0.61  CREATININE 1.11 1.26* 1.24  --     Estimated Creatinine Clearance: 64.7 mL/min (by C-G formula based on SCr of 1.24 mg/dL).   Medical History: Past Medical History:  Diagnosis Date  . Acute gastric ulcer with hemorrhage 07/30/2014  . Hypertension   . Sleep apnea     Medications:  Medications Prior to Admission  Medication Sig Dispense Refill Last Dose  . acetaminophen (TYLENOL) 325 MG tablet Take 975 mg by mouth daily as needed for mild pain.   Past Month at Unknown time  . Coenzyme Q10 (CO Q 10) 100 MG CAPS Take 100 mg by mouth daily.   Past Week at Unknown time  . losartan (COZAAR) 50 MG tablet Take 50 mg by mouth daily.   Past Week at Unknown time  . Multiple Vitamin (MULTIVITAMIN) tablet Take 1 tablet by mouth daily.   Past Week at Unknown time  . tiZANidine (ZANAFLEX) 2 MG tablet Take 1 tablet by mouth 3 (three) times daily as needed for muscle spasms.   Past Week at Unknown time  . cefdinir (OMNICEF) 300 MG capsule Take 1 capsule (300 mg total) by mouth 2 (two) times daily. (Patient not taking: Reported on 09/18/2020) 28 capsule 0 Not Taking at Unknown time   Scheduled:  . (feeding supplement) PROSource Plus  30 mL Oral BID BM  . Chlorhexidine Gluconate Cloth  6 each Topical Daily  . feeding supplement (GLUCERNA SHAKE)  237 mL Oral TID BM  . folic acid  1  mg Oral Daily  . hydrocortisone sod succinate (SOLU-CORTEF) inj  50 mg Intravenous Q12H  . insulin aspart  0-15 Units Subcutaneous TID WC  . insulin aspart  0-5 Units Subcutaneous QHS  . multivitamin with minerals  1 tablet Oral Daily  . [START ON 09/29/2020] pantoprazole  40 mg Intravenous Q12H  . potassium chloride  40 mEq Oral Once  . thiamine  100 mg Oral Daily   Infusions:  . sodium chloride Stopped (09/26/20 0812)  .  ceFAZolin (ANCEF) IV Stopped (09/27/20 2145)  . heparin 1,650 Units/hr (09/27/20 1841)  . lactated ringers 10 mL/hr at 09/26/20 0903  . pantoprozole (PROTONIX) infusion 8 mg/hr (09/27/20 1841)   Assessment: 68 yoM admitted with sepsis d/t pyelonephritis. Noted to have RLE edema on 12/20 and LE doppler performed which revealed extensive R DVT. Pharmacy consulted to dose heparin. Per discussion with MD, low platelets felt due primarily to sepsis and are showing good recovery at time of heparin initiation.   Significant events: 12/22: Heparin of for EGD, PRBC x 1 unit 12/23: EGD showed non bleeding gastric ulcer on lesser curvature s/p cautery, three non bleeding gastric antral ulcers with no bleeding.  12/24: Ok to resume heparin; Hgb low but stable; Plts low but stable.  23:07 heparin level = 0.61 (therapeutic) with heparin infusing @ 1650 units/hr.  Confirmed with RN no complications of therapy noted.  Goal of Therapy: Heparin level 0.3-0.7 units/ml Monitor platelets by anticoagulation protocol: Yes  Plan:  Continue heparin @ 1650 units/hr   Recheck heparin level in 8hrs to confirm therapeutic dose  Daily CBC, daily heparin level once stable  Monitor for signs of bleeding or thrombosis  [name], PharmD 09/28/2020 12:31 AM

## 2020-09-28 NOTE — Plan of Care (Signed)
  Problem: Consults Goal: Venous Thromboembolism Patient Education Description: See Patient Education Module for education specifics. Outcome: Progressing

## 2020-09-28 NOTE — Progress Notes (Signed)
Pharmacy Brief Note - Anticoagulation Follow Up:  Patient on heparin drip for DVT, for full history see note by Peggyann Juba, PharmD.   Assessment  HL = 0.79 remains supratherapeutic despite decreasing rate of heparin drip to 1400 units/hr  Confirmed with RN that heparin infusing at correct rate. No signs of bleeding  Goal: HL 0.3-0.7  Plan:  Decrease heparin infusion to 1250 units/hr  Check HL in 8 hours  CBC, HL daily  Monitor for signs and symptoms of bleeding  Lenis Noon, PharmD 09/28/20 8:49 PM

## 2020-09-28 NOTE — Progress Notes (Signed)
[facility] for IV heparin Indication: DVT  Allergies  Allergen Reactions  . Furosemide     Muscle cramps    Patient Measurements: Height: 6' (182.9 cm) Weight: 96.1 kg (211 lb 13.8 oz) IBW/kg (Calculated) : 77.6 Heparin Dosing Weight: 94.8 kg  Vital Signs: Temp: 97.9 F (36.6 C) (12/25 0800) Temp Source: Oral (12/25 0800) BP: 110/61 (12/25 0600) Pulse Rate: 71 (12/25 0600)  Labs: Recent Labs    09/26/20 0250 09/27/20 0241 09/27/20 2307 09/28/20 0810  HGB 8.1* 7.9*  --  7.6*  HCT 26.2* 26.1*  --  25.0*  PLT 131* 132*  --  148*  HEPARINUNFRC  --   --  0.61 1.10*  CREATININE 1.26* 1.24  --  1.18    Estimated Creatinine Clearance: 68 mL/min (by C-G formula based on SCr of 1.18 mg/dL).   Medical History: Past Medical History:  Diagnosis Date  . Acute gastric ulcer with hemorrhage 07/30/2014  . Hypertension   . Sleep apnea     Medications:  Medications Prior to Admission  Medication Sig Dispense Refill Last Dose  . acetaminophen (TYLENOL) 325 MG tablet Take 975 mg by mouth daily as needed for mild pain.   Past Month at Unknown time  . Coenzyme Q10 (CO Q 10) 100 MG CAPS Take 100 mg by mouth daily.   Past Week at Unknown time  . losartan (COZAAR) 50 MG tablet Take 50 mg by mouth daily.   Past Week at Unknown time  . Multiple Vitamin (MULTIVITAMIN) tablet Take 1 tablet by mouth daily.   Past Week at Unknown time  . tiZANidine (ZANAFLEX) 2 MG tablet Take 1 tablet by mouth 3 (three) times daily as needed for muscle spasms.   Past Week at Unknown time  . cefdinir (OMNICEF) 300 MG capsule Take 1 capsule (300 mg total) by mouth 2 (two) times daily. (Patient not taking: Reported on 09/18/2020) 28 capsule 0 Not Taking at Unknown time   Scheduled:  . (feeding supplement) PROSource Plus  30 mL Oral BID BM  . Chlorhexidine Gluconate Cloth  6 each Topical Daily  . feeding supplement (GLUCERNA SHAKE)  237 mL Oral TID BM  . folic acid  1 mg  Oral Daily  . hydrocortisone sod succinate (SOLU-CORTEF) inj  50 mg Intravenous Q12H  . insulin aspart  0-15 Units Subcutaneous TID WC  . insulin aspart  0-5 Units Subcutaneous QHS  . multivitamin with minerals  1 tablet Oral Daily  . [START ON 09/29/2020] pantoprazole  40 mg Intravenous Q12H  . potassium chloride  40 mEq Oral Once  . thiamine  100 mg Oral Daily   Infusions:  . sodium chloride Stopped (09/26/20 0812)  .  ceFAZolin (ANCEF) IV Stopped (09/28/20 0530)  . heparin 1,650 Units/hr (09/28/20 0932)  . lactated ringers 10 mL/hr at 09/26/20 0903  . pantoprozole (PROTONIX) infusion 8 mg/hr (09/28/20 0458)   Assessment: 31 yoM admitted with sepsis d/t pyelonephritis. Noted to have RLE edema on 12/20 and LE doppler performed which revealed extensive R DVT. Pharmacy consulted to dose heparin. Per discussion with MD, low platelets felt due primarily to sepsis and are showing good recovery at time of heparin initiation.   Significant events: 12/22: Heparin of for EGD, PRBC x 1 unit 12/23: EGD showed non bleeding gastric ulcer on lesser curvature s/p cautery, three non bleeding gastric antral ulcers with no bleeding.  12/24: Ok to resume heparin; Hgb low but stable; Plts low but stable.  Heparin level 1.01, supratherapeutic on 1650 units/hr.    Confirmed with RN no bleeding or complications of therapy noted.  Hgb decreased 7.6, Plts improved 148  Goal of Therapy: Heparin level 0.3-0.7 units/ml Monitor platelets by anticoagulation protocol: Yes  Plan:  Hold heparin drip x 1 hour, then decrease to 1400 units/hr   Recheck heparin level in 8hrs   Daily CBC, daily heparin level once stable  Monitor for signs of bleeding or thrombosis  Peggyann Juba, PharmD, BCPS Pharmacy: [phone] 09/28/2020 8:41 AM

## 2020-09-28 NOTE — Progress Notes (Signed)
PROGRESS NOTE    Edward Wells  JGO:[id number] DOB: 26-Nov-1945 DOA: 09/17/2020 PCP: Elizabeth Palau, MD (Inactive)    Chief Complaint  Patient presents with  . Diarrhea    N/V/D    Brief Narrative:  74 y/o male brought to the ED on 12/14 Admitted for shock due to pyelonephritis, s/p  Right ureteral stent in 12/15, initially started on pressors, and weaned off. He was found to have e coli bacteremia. On 12/21 pt became hypotensive, was transferred to ICU , developed malena. Underwent EGD on 12/23 showing non bleeding gastric ulcer on lesser curvature s/p cautery, three non bleeding gastric antral ulcers with no bleeding.  Pt seen and examined at bedside. No new complaints. His bp parameters improved.   Assessment & Plan:   Active Problems:   Anemia   Pyelonephritis   Acute unilateral obstructive uropathy   SIRS (systemic inflammatory response syndrome) (HCC)   Adrenal insufficiency (HCC)   Acute blood loss anemia   Melena   Anticoagulated  Recurrent Hypotension? Circulatory shock: Probably secondary to acute UGIB/ relative adrenal insufficiency super imposed on E Coli bacteremia in the setting of pyelonephritis with obstructive uropathy/ hydronephrosis secondary to nephrolithiasis s/p stent on 12/152021. - weaned off stress dose steroids.  - continue with antibiotics to complete 14 day course.     Upper GI bleed with blood loss anemia; S/p EGd on 12/23 : non-bleeding gastric ulcer on lesser curvature with pigmented raised material.Treated with bipolar cautery. Three non-bleeding gastric antral ulcers with no stigmata of bleeding.Severe erosive gastropathy  Continue with PPI BID from tomorrow.   Appreciate gastroenterology recommendations.  Transfuse to keep hemoglobin greater than 7. Last hemoglobin around 7.6.  RLE DVT:  - resume heparin, transition to oral NOAC on discharge. .    AKI;  Improved with hydration.  creatinine is 1.18   Thrombocytopenia:  Monitor.  Improving.    Moderate Protein calorie malnutrition:  Supplementation added.   Hypokalemia;  Replaced.    Tea colored Urine:  UA ordered to evaluate for hematuria.    DVT prophylaxis: (scd's Code Status: (DNR) Family Communication: (none at bedside) Disposition:   Status is: Inpatient  Remains inpatient appropriate because:Unsafe d/c plan, IV treatments appropriate due to intensity of illness or inability to take PO and Inpatient level of care appropriate due to severity of illness   Dispo: The patient is from: Home              Anticipated d/c is to: Home              Anticipated d/c date is: 2 days              Patient currently is not medically stable to d/c.       Consultants:   Gastroenterology  PCCM.  Urology   Procedures: EGD.  Antimicrobials:  Antibiotics Given (last 72 hours)    Date/Time Action Medication Dose Rate   09/25/20 1339 New Bag/Given   ceFAZolin (ANCEF) IVPB 2g/100 mL premix 2 g 200 mL/hr   09/25/20 2239 New Bag/Given   ceFAZolin (ANCEF) IVPB 2g/100 mL premix 2 g 200 mL/hr   09/26/20 0501 New Bag/Given   ceFAZolin (ANCEF) IVPB 2g/100 mL premix 2 g 200 mL/hr   09/26/20 1422 New Bag/Given   ceFAZolin (ANCEF) IVPB 2g/100 mL premix 2 g 200 mL/hr   09/26/20 2148 New Bag/Given   ceFAZolin (ANCEF) IVPB 2g/100 mL premix 2 g 200 mL/hr   09/27/20 0555 New Bag/Given   ceFAZolin (  ANCEF) IVPB 2g/100 mL premix 2 g 200 mL/hr   09/27/20 1414 New Bag/Given   ceFAZolin (ANCEF) IVPB 2g/100 mL premix 2 g 200 mL/hr   09/27/20 2115 New Bag/Given   ceFAZolin (ANCEF) IVPB 2g/100 mL premix 2 g 200 mL/hr   09/28/20 0500 New Bag/Given   ceFAZolin (ANCEF) IVPB 2g/100 mL premix 2 g 200 mL/hr          Subjective: No new complaints.   Objective: Vitals:   09/28/20 0600 09/28/20 0700 09/28/20 0800 09/28/20 0900  BP: 110/61 129/68  120/62  Pulse: 71 71 67 76  Resp: 13 15 13 15   Temp:   97.9 F (36.6 C)   TempSrc:   Oral   SpO2: 96% 97% 99% 96%   Weight:      Height:        Intake/Output Summary (Last 24 hours) at 09/28/2020 0948 Last data filed at 09/28/2020 0646 Gross per 24 hour  Intake 401.37 ml  Output 500 ml  Net -98.63 ml   Filed Weights   09/18/20 1400 09/23/20 0451 09/26/20 0830  Weight: 94.8 kg 96.1 kg 96.1 kg    Examination:  General exam: alert and comfortable.  Respiratory system: clear to auscultation no wheezing or rhonchi.  Cardiovascular system: S1S2, RRR, no JVD, no pedal edema.  Gastrointestinal system: Abdomen is soft, non tender non distended, bowel sounds wnl.  Central nervous system: Alert and oriented, grossly non focal.  Extremities: No pedal edema.  Skin: No rashes seen.  Psychiatry: Mood is appropriate.     Data Reviewed: I have personally reviewed following labs and imaging studies  CBC: Recent Labs  Lab 09/24/20 0111 09/24/20 1518 09/25/20 0629 09/26/20 0250 09/27/20 0241 09/28/20 0810  WBC 12.3* 10.3 10.4 8.6 8.3 7.1  NEUTROABS 9.9*  --  7.6 7.5 7.1 4.6  HGB 8.7* 8.3* 7.5* 8.1* 7.9* 7.6*  HCT 28.5* 27.1* 24.9* 26.2* 26.1* 25.0*  MCV 88.2 88.3 88.9 85.6 86.7 86.8  PLT 118* 132* 132* 131* 132* 148*    Basic Metabolic Panel: Recent Labs  Lab 09/22/20 0538 09/23/20 0509 09/24/20 0111 09/24/20 1505 09/25/20 0629 09/26/20 0250 09/27/20 0241 09/28/20 0810  NA 153*   < > 146* 148* 144 141 143 140  K 4.0   < > 3.8 3.6 3.6 4.0 4.0 3.4*  CL 117*   < > 114* 116* 114* 111 113* 111  CO2 26   < > 25 26 20* 20* 19* 23  GLUCOSE 203*   < > 211* 156* 179* 275* 184* 151*  BUN 33*   < > 23 30* 18 15 16 18   CREATININE 1.18   < > 1.04 1.04 1.11 1.26* 1.24 1.18  CALCIUM 8.6*   < > 8.1* 8.2* 8.0* 8.1* 8.4* 8.1*  MG 1.7  --  1.4*  --   --  1.6* 2.1  --    < > = values in this interval not displayed.    GFR: Estimated Creatinine Clearance: 68 mL/min (by C-G formula based on SCr of 1.18 mg/dL).  Liver Function Tests: Recent Labs  Lab 09/22/20 0538 09/23/20 0509 09/23/20 2224  09/24/20 0111 09/25/20 0629  AST 23 31 27 28 26   ALT 32 39 36 34 32  ALKPHOS 55 57 55 48 42  BILITOT 0.8 0.7 0.7 0.3 0.5  PROT 5.3* 6.1* 5.4* 5.0* 4.9*  ALBUMIN 2.3* 2.6* 2.5* 2.2* 2.2*    CBG: Recent Labs  Lab 09/27/20 0726 09/27/20 1241 09/27/20 1558 09/27/20 2114  09/28/20 0730  GLUCAP 172* 210* 174* 96 140*     Recent Results (from the past 240 hour(s))  MRSA PCR Screening     Status: None   Collection Time: 09/18/20  1:40 PM   Specimen: Nasopharyngeal  Result Value Ref Range Status   MRSA by PCR NEGATIVE NEGATIVE Final    Comment:        The GeneXpert MRSA Assay (FDA approved for NASAL specimens only), is one component of a comprehensive MRSA colonization surveillance program. It is not intended to diagnose MRSA infection nor to guide or monitor treatment for MRSA infections. Performed at [facility], [place] [address] [place], [place] [address]          Radiology Studies: No results found.      Scheduled Meds: . (feeding supplement) PROSource Plus  30 mL Oral BID BM  . Chlorhexidine Gluconate Cloth  6 each Topical Daily  . feeding supplement (GLUCERNA SHAKE)  237 mL Oral TID BM  . folic acid  1 mg Oral Daily  . insulin aspart  0-15 Units Subcutaneous TID WC  . insulin aspart  0-5 Units Subcutaneous QHS  . multivitamin with minerals  1 tablet Oral Daily  . [START ON 09/29/2020] pantoprazole  40 mg Intravenous Q12H  . potassium chloride  40 mEq Oral Once  . thiamine  100 mg Oral Daily   Continuous Infusions: . sodium chloride Stopped (09/26/20 0812)  .  ceFAZolin (ANCEF) IV Stopped (09/28/20 0530)  . heparin    . lactated ringers 10 mL/hr at 09/26/20 0903  . pantoprozole (PROTONIX) infusion 8 mg/hr (09/28/20 0458)     LOS: 10 days       Hosie Poisson, MD [facility]   To contact the attending provider between 7A-7P or the covering provider during after hours 7P-7A, please log into the web site [email or web address]  and access using universal [place] password for that web site. If you do not have the password, please call the hospital operator.  09/28/2020, 9:48 AM

## 2020-09-29 LAB — CBC WITH DIFFERENTIAL/PLATELET
Abs Immature Granulocytes: 0.1 10*3/uL — ABNORMAL HIGH (ref 0.00–0.07)
Basophils Absolute: 0 10*3/uL (ref 0.0–0.1)
Basophils Relative: 1 %
Eosinophils Absolute: 0.1 10*3/uL (ref 0.0–0.5)
Eosinophils Relative: 1 %
HCT: 26.2 % — ABNORMAL LOW (ref 39.0–52.0)
Hemoglobin: 8 g/dL — ABNORMAL LOW (ref 13.0–17.0)
Immature Granulocytes: 2 %
Lymphocytes Relative: 32 %
Lymphs Abs: 2.1 10*3/uL (ref 0.7–4.0)
MCH: 26.9 pg (ref 26.0–34.0)
MCHC: 30.5 g/dL (ref 30.0–36.0)
MCV: 88.2 fL (ref 80.0–100.0)
Monocytes Absolute: 0.7 10*3/uL (ref 0.1–1.0)
Monocytes Relative: 10 %
Neutro Abs: 3.5 10*3/uL (ref 1.7–7.7)
Neutrophils Relative %: 54 %
Platelets: 145 10*3/uL — ABNORMAL LOW (ref 150–400)
RBC: 2.97 MIL/uL — ABNORMAL LOW (ref 4.22–5.81)
RDW: 18.3 % — ABNORMAL HIGH (ref 11.5–15.5)
WBC: 6.4 10*3/uL (ref 4.0–10.5)
nRBC: 0.3 % — ABNORMAL HIGH (ref 0.0–0.2)

## 2020-09-29 LAB — HEPARIN LEVEL (UNFRACTIONATED)
Heparin Unfractionated: 0.5 IU/mL (ref 0.30–0.70)
Heparin Unfractionated: 0.45 IU/mL (ref 0.30–0.70)

## 2020-09-29 LAB — BASIC METABOLIC PANEL
Anion gap: 6 (ref 5–15)
BUN: 17 mg/dL (ref 8–23)
CO2: 24 mmol/L (ref 22–32)
Calcium: 8.2 mg/dL — ABNORMAL LOW (ref 8.9–10.3)
Chloride: 113 mmol/L — ABNORMAL HIGH (ref 98–111)
Creatinine, Ser: 1.15 mg/dL (ref 0.61–1.24)
GFR, Estimated: >60 mL/min (ref >60)
Glucose, Bld: 134 mg/dL — ABNORMAL HIGH (ref 70–99)
Potassium: 3.4 mmol/L — ABNORMAL LOW (ref 3.5–5.1)
Sodium: 143 mmol/L (ref 135–145)

## 2020-09-29 LAB — GLUCOSE, CAPILLARY
Glucose-Capillary: 133 mg/dL — ABNORMAL HIGH (ref 70–99)
Glucose-Capillary: 141 mg/dL — ABNORMAL HIGH (ref 70–99)
Glucose-Capillary: 104 mg/dL — ABNORMAL HIGH (ref 70–99)
Glucose-Capillary: 156 mg/dL — ABNORMAL HIGH (ref 70–99)

## 2020-09-29 MED ORDER — PANTOPRAZOLE SODIUM 40 MG PO TBEC
40.0000 mg | DELAYED_RELEASE_TABLET | Freq: Two times a day (BID) | ORAL | Status: DC
  Administered 2020-09-29 – 2020-10-02 (×6): 40 mg via ORAL
  Filled 2020-09-29 (×6): qty 1

## 2020-09-29 MED ORDER — APIXABAN 5 MG PO TABS
10.0000 mg | ORAL_TABLET | Freq: Two times a day (BID) | ORAL | Status: DC
  Administered 2020-09-29: 21:00:00 10 mg via ORAL
  Filled 2020-09-29: qty 2

## 2020-09-29 MED ORDER — APIXABAN 5 MG PO TABS: 5.0000 mg | ORAL_TABLET | Freq: Two times a day (BID) | ORAL | Status: DC

## 2020-09-29 NOTE — Progress Notes (Addendum)
[facility] for IV heparin Indication: DVT  Allergies  Allergen Reactions  . Furosemide     Muscle cramps    Patient Measurements: Height: 6' (182.9 cm) Weight: 96.1 kg (211 lb 13.8 oz) IBW/kg (Calculated) : 77.6 Heparin Dosing Weight: 94.8 kg  Vital Signs: Temp: 98.9 F (37.2 C) (12/26 0501) Temp Source: Oral (12/26 0501) BP: 111/62 (12/26 0501) Pulse Rate: 72 (12/26 0501)  Labs: Recent Labs    09/27/20 0241 09/27/20 2307 09/28/20 0810 09/28/20 1906 09/29/20 0448  HGB 7.9*  --  7.6*  --  8.0*  HCT 26.1*  --  25.0*  --  26.2*  PLT 132*  --  148*  --  145*  HEPARINUNFRC  --    < > 1.10* 0.79* 0.50  CREATININE 1.24  --  1.18  --  1.15   < > = values in this interval not displayed.    Estimated Creatinine Clearance: 69.8 mL/min (by C-G formula based on SCr of 1.15 mg/dL).   Medical History: Past Medical History:  Diagnosis Date  . Acute gastric ulcer with hemorrhage 07/30/2014  . Hypertension   . Sleep apnea     Assessment: 32 yoM admitted with sepsis 2/2 pyelonephritis. Noted to have RLE edema on 12/20 and LE doppler performed which revealed extensive R DVT. Pharmacy consulted to dose IV heparin. Per discussion with MD, low platelets felt due primarily to sepsis and are showing good recovery at time of heparin initiation.  Significant events: 12/22: Heparin off for EGD, PRBC x 1 unit 12/23: EGD showed non bleeding gastric ulcer on lesser curvature s/p cautery, three non bleeding gastric antral ulcers with no bleeding.  12/24: Ok to resume heparin; Hgb low but stable; Plts low but stable.  Today, 09/29/20:  Heparin level 0.5 unit/mL, now therapeutic after decrease in rate   CBC: Hgb 8, low but stable. Pltc 145K, also low but stable   No bleeding or infusion issues noted per nursing  RN still reports patient with tea colored urine, slightly better than yesterday   Goal of Therapy: Heparin level 0.3-0.7  units/ml Monitor platelets by anticoagulation protocol: Yes  Plan:  Continue heparin infusion at 1250 units/hr   Recheck heparin level 8 hours from previous level to ensure remains within therapeutic range   Daily CBC, heparin level   Monitor closely for s/sx of bleeding   Lindell Spar, PharmD, BCPS Pharmacy: [phone] 09/29/2020 9:36 AM   Addendum: 1428 heparin level = 0.45 units/mL, remains therapeutic. Continue current heparin infusion rate of 1250 units/hr. Next heparin level in AM.   Lindell Spar, PharmD, BCPS Clinical Pharmacist 09/29/2020 3:01 PM   Addendum: Now asked to transition to Apixaban  Plan: Stop heparin infusion at 2200 At the same time, start Apixaban 10mg  PO BID x 7 days, then 5mg  PO BID thereafter Monitor CBC and for s/sx of bleeding   Lindell Spar, PharmD, BCPS Clinical Pharmacist  09/29/2020 6:58 PM

## 2020-09-29 NOTE — Progress Notes (Signed)
PROGRESS NOTE    Edward Wells  [id number] DOB: 11-10-1945 DOA: 09/17/2020 PCP: Elizabeth Palau, MD (Inactive)    Chief Complaint  Patient presents with  . Diarrhea    N/V/D    Brief Narrative:  74 y/o male brought to the ED on 12/14 Admitted for shock due to pyelonephritis, s/p  Right ureteral stent in 12/15, initially started on pressors, and weaned off. He was found to have e coli bacteremia. On 12/21 pt became hypotensive, was transferred to ICU , developed malena. Underwent EGD on 12/23 showing non bleeding gastric ulcer on lesser curvature s/p cautery, three non bleeding gastric antral ulcers with no bleeding.PT seen and examined, in good spirits,no new complaints.   Assessment & Plan:   Active Problems:   Anemia   Pyelonephritis   Acute unilateral obstructive uropathy   SIRS (systemic inflammatory response syndrome) (HCC)   Adrenal insufficiency (HCC)   Acute blood loss anemia   Melena   Anticoagulated  Recurrent Hypotension? Circulatory shock: Probably secondary to acute UGIB/ relative adrenal insufficiency super imposed on E Coli bacteremia in the setting of pyelonephritis with obstructive uropathy/ hydronephrosis secondary to nephrolithiasis s/p stent on 12/152021. - weaned off stress dose steroids.  - continue with antibiotics to complete 14 day course.  - bp parameters are optimal.     Upper GI bleed with blood loss anemia; S/p EGd on 12/23 : non-bleeding gastric ulcer on lesser curvature with pigmented raised material.Treated with bipolar cautery. Three non-bleeding gastric antral ulcers with no stigmata of bleeding.Severe erosive gastropathy  Continue with PPI BID oral today.  Appreciate gastroenterology recommendations. Biopsy results are pending.  Transfuse to keep hemoglobin greater than 7. Last hemoglobin around 8.    RLE DVT:  - resume heparin, transition to oral NOAC today.    AKI;  Improved with hydration.  Creatinine at  1.15   Thrombocytopenia:  Mild.    Moderate Protein calorie malnutrition:  Supplementation added.   Hypokalemia;  Replaced.    Tea colored Urine:  UA ordered to evaluate for hematuria.  Some RBC 's in the urine. But  No bacteria.    DVT prophylaxis: (scd's) Code Status: (DNR) Family Communication: daughter at bedside,  Disposition:   Status is: Inpatient  Remains inpatient appropriate because:Unsafe d/c plan, IV treatments appropriate due to intensity of illness or inability to take PO and Inpatient level of care appropriate due to severity of illness   Dispo: The patient is from: Home              Anticipated d/c is to: Home              Anticipated d/c date is: 1 day              Patient currently is not medically stable to d/c.       Consultants:   Gastroenterology  PCCM.  Urology   Procedures: EGD.  Antimicrobials:  Antibiotics Given (last 72 hours)    Date/Time Action Medication Dose Rate   09/26/20 2148 New Bag/Given   ceFAZolin (ANCEF) IVPB 2g/100 mL premix 2 g 200 mL/hr   09/27/20 0555 New Bag/Given   ceFAZolin (ANCEF) IVPB 2g/100 mL premix 2 g 200 mL/hr   09/27/20 1414 New Bag/Given   ceFAZolin (ANCEF) IVPB 2g/100 mL premix 2 g 200 mL/hr   09/27/20 2115 New Bag/Given   ceFAZolin (ANCEF) IVPB 2g/100 mL premix 2 g 200 mL/hr   09/28/20 0500 New Bag/Given   ceFAZolin (ANCEF) IVPB 2g/100 mL  premix 2 g 200 mL/hr   09/28/20 1420 New Bag/Given   ceFAZolin (ANCEF) IVPB 2g/100 mL premix 2 g 200 mL/hr   09/28/20 2117 New Bag/Given   ceFAZolin (ANCEF) IVPB 2g/100 mL premix 2 g 200 mL/hr   09/29/20 0536 New Bag/Given   ceFAZolin (ANCEF) IVPB 2g/100 mL premix 2 g 200 mL/hr   09/29/20 1420 New Bag/Given   ceFAZolin (ANCEF) IVPB 2g/100 mL premix 2 g 200 mL/hr          Subjective: No new complaints.   Objective: Vitals:   09/29/20 0143 09/29/20 0501 09/29/20 1111 09/29/20 1220  BP: 115/63 111/62 113/73 112/64  Pulse: 73 72 85 92  Resp: 18 18  16 18   Temp: 98.7 F (37.1 C) 98.9 F (37.2 C) 99 F (37.2 C) 98.2 F (36.8 C)  TempSrc: Oral Oral Oral Oral  SpO2: 96% 99% 100% 100%  Weight:      Height:        Intake/Output Summary (Last 24 hours) at 09/29/2020 1604 Last data filed at 09/29/2020 1400 Gross per 24 hour  Intake 648.22 ml  Output 1050 ml  Net -401.78 ml   Filed Weights   09/18/20 1400 09/23/20 0451 09/26/20 0830  Weight: 94.8 kg 96.1 kg 96.1 kg    Examination:  General exam: alert and comfortable.  Respiratory system: Clear to auscultation bilaterally, no wheezing or rhonchi Cardiovascular system: S1-S2 heard, regular rate rhythm  gastrointestinal system: Abdomen is soft, nontender, nondistended, bowel sounds normal Central nervous system: Alert and oriented, grossly nonfocal Extremities: Trace pedal edema  skin: No rashes seen Psychiatry: Mood is appropriate    Data Reviewed: I have personally reviewed following labs and imaging studies  CBC: Recent Labs  Lab 09/25/20 0629 09/26/20 0250 09/27/20 0241 09/28/20 0810 09/29/20 0448  WBC 10.4 8.6 8.3 7.1 6.4  NEUTROABS 7.6 7.5 7.1 4.6 3.5  HGB 7.5* 8.1* 7.9* 7.6* 8.0*  HCT 24.9* 26.2* 26.1* 25.0* 26.2*  MCV 88.9 85.6 86.7 86.8 88.2  PLT 132* 131* 132* 148* 145*    Basic Metabolic Panel: Recent Labs  Lab 09/24/20 0111 09/24/20 1505 09/25/20 0629 09/26/20 0250 09/27/20 0241 09/28/20 0810 09/29/20 0448  NA 146*   < > 144 141 143 140 143  K 3.8   < > 3.6 4.0 4.0 3.4* 3.4*  CL 114*   < > 114* 111 113* 111 113*  CO2 25   < > 20* 20* 19* 23 24  GLUCOSE 211*   < > 179* 275* 184* 151* 134*  BUN 23   < > 18 15 16 18 17   CREATININE 1.04   < > 1.11 1.26* 1.24 1.18 1.15  CALCIUM 8.1*   < > 8.0* 8.1* 8.4* 8.1* 8.2*  MG 1.4*  --   --  1.6* 2.1  --   --    < > = values in this interval not displayed.    GFR: Estimated Creatinine Clearance: 69.8 mL/min (by C-G formula based on SCr of 1.15 mg/dL).  Liver Function Tests: Recent Labs  Lab  09/23/20 0509 09/23/20 2224 09/24/20 0111 09/25/20 0629  AST 31 27 28 26   ALT 39 36 34 32  ALKPHOS 57 55 48 42  BILITOT 0.7 0.7 0.3 0.5  PROT 6.1* 5.4* 5.0* 4.9*  ALBUMIN 2.6* 2.5* 2.2* 2.2*    CBG: Recent Labs  Lab 09/28/20 1202 09/28/20 1654 09/28/20 2125 09/29/20 0734 09/29/20 1217  GLUCAP 120* 189* 87 133* 141*     No results  found for this or any previous visit (from the past 240 hour(s)).       Radiology Studies: No results found.      Scheduled Meds: . (feeding supplement) PROSource Plus  30 mL Oral BID BM  . Chlorhexidine Gluconate Cloth  6 each Topical Daily  . feeding supplement (GLUCERNA SHAKE)  237 mL Oral TID BM  . folic acid  1 mg Oral Daily  . insulin aspart  0-15 Units Subcutaneous TID WC  . insulin aspart  0-5 Units Subcutaneous QHS  . multivitamin with minerals  1 tablet Oral Daily  . pantoprazole  40 mg Oral BID  . thiamine  100 mg Oral Daily   Continuous Infusions: . sodium chloride Stopped (09/26/20 0812)  .  ceFAZolin (ANCEF) IV 2 g (09/29/20 1420)  . heparin 1,250 Units/hr (09/29/20 0141)  . lactated ringers 10 mL/hr at 09/29/20 1039     LOS: 11 days       Hosie Poisson, MD [facility]   To contact the attending provider between 7A-7P or the covering provider during after hours 7P-7A, please log into the web site [email or web address] and access using universal [place] password for that web site. If you do not have the password, please call the hospital operator.  09/29/2020, 4:04 PM

## 2020-09-30 ENCOUNTER — Inpatient Hospital Stay (HOSPITAL_COMMUNITY): Payer: Medicare Other

## 2020-09-30 ENCOUNTER — Encounter (HOSPITAL_COMMUNITY): Payer: Self-pay | Admitting: Pulmonary Disease

## 2020-09-30 ENCOUNTER — Other Ambulatory Visit: Payer: Self-pay

## 2020-09-30 HISTORY — PX: IR IVC FILTER PLMT / S&I /IMG GUID/MOD SED: IMG701

## 2020-09-30 LAB — CBC WITH DIFFERENTIAL/PLATELET
Abs Immature Granulocytes: 0.04 10*3/uL (ref 0.00–0.07)
Basophils Absolute: 0 10*3/uL (ref 0.0–0.1)
Basophils Relative: 1 %
Eosinophils Absolute: 0.1 10*3/uL (ref 0.0–0.5)
Eosinophils Relative: 3 %
HCT: 22 % — ABNORMAL LOW (ref 39.0–52.0)
Hemoglobin: 6.8 g/dL — CL (ref 13.0–17.0)
Immature Granulocytes: 1 %
Lymphocytes Relative: 29 %
Lymphs Abs: 1.3 10*3/uL (ref 0.7–4.0)
MCH: 27.5 pg (ref 26.0–34.0)
MCHC: 30.9 g/dL (ref 30.0–36.0)
MCV: 89.1 fL (ref 80.0–100.0)
Monocytes Absolute: 0.5 10*3/uL (ref 0.1–1.0)
Monocytes Relative: 11 %
Neutro Abs: 2.5 10*3/uL (ref 1.7–7.7)
Neutrophils Relative %: 55 %
Platelets: 136 10*3/uL — ABNORMAL LOW (ref 150–400)
RBC: 2.47 MIL/uL — ABNORMAL LOW (ref 4.22–5.81)
RDW: 18.8 % — ABNORMAL HIGH (ref 11.5–15.5)
WBC: 4.4 10*3/uL (ref 4.0–10.5)
nRBC: 0.5 % — ABNORMAL HIGH (ref 0.0–0.2)

## 2020-09-30 LAB — GLUCOSE, CAPILLARY
Glucose-Capillary: 140 mg/dL — ABNORMAL HIGH (ref 70–99)
Glucose-Capillary: 116 mg/dL — ABNORMAL HIGH (ref 70–99)
Glucose-Capillary: 138 mg/dL — ABNORMAL HIGH (ref 70–99)

## 2020-09-30 LAB — SURGICAL PATHOLOGY

## 2020-09-30 LAB — BASIC METABOLIC PANEL
Anion gap: 3 — ABNORMAL LOW (ref 5–15)
BUN: 18 mg/dL (ref 8–23)
CO2: 27 mmol/L (ref 22–32)
Calcium: 8 mg/dL — ABNORMAL LOW (ref 8.9–10.3)
Chloride: 111 mmol/L (ref 98–111)
Creatinine, Ser: 1.03 mg/dL (ref 0.61–1.24)
GFR, Estimated: >60 mL/min (ref >60)
Glucose, Bld: 148 mg/dL — ABNORMAL HIGH (ref 70–99)
Potassium: 3.3 mmol/L — ABNORMAL LOW (ref 3.5–5.1)
Sodium: 141 mmol/L (ref 135–145)

## 2020-09-30 LAB — HEMOGLOBIN AND HEMATOCRIT, BLOOD
HCT: 24 % — ABNORMAL LOW (ref 39.0–52.0)
Hemoglobin: 7.5 g/dL — ABNORMAL LOW (ref 13.0–17.0)

## 2020-09-30 LAB — PREPARE RBC (CROSSMATCH)

## 2020-09-30 MED ORDER — LIDOCAINE HCL 1 % IJ SOLN
INTRAMUSCULAR | Status: AC
  Filled 2020-09-30: qty 20

## 2020-09-30 MED ORDER — FENTANYL CITRATE (PF) 100 MCG/2ML IJ SOLN
INTRAMUSCULAR | Status: DC | PRN
  Administered 2020-09-30 (×2): 50 ug via INTRAVENOUS

## 2020-09-30 MED ORDER — MIDAZOLAM HCL 2 MG/2ML IJ SOLN
INTRAMUSCULAR | Status: DC | PRN
  Administered 2020-09-30 (×2): 1 mg via INTRAVENOUS

## 2020-09-30 MED ORDER — LIDOCAINE HCL (PF) 1 % IJ SOLN
INTRAMUSCULAR | Status: DC | PRN
  Administered 2020-09-30: 5 mL

## 2020-09-30 MED ORDER — FENTANYL CITRATE (PF) 100 MCG/2ML IJ SOLN
INTRAMUSCULAR | Status: AC
  Filled 2020-09-30: qty 2

## 2020-09-30 MED ORDER — MIDAZOLAM HCL 2 MG/2ML IJ SOLN
INTRAMUSCULAR | Status: AC
  Filled 2020-09-30: qty 2

## 2020-09-30 MED ORDER — POTASSIUM CHLORIDE CRYS ER 20 MEQ PO TBCR
40.0000 meq | EXTENDED_RELEASE_TABLET | Freq: Once | ORAL | Status: AC
  Administered 2020-09-30: 11:00:00 40 meq via ORAL
  Filled 2020-09-30: qty 2

## 2020-09-30 MED ORDER — SODIUM CHLORIDE 0.9% IV SOLUTION: Freq: Once | INTRAVENOUS | Status: DC

## 2020-09-30 MED ORDER — IOHEXOL 300 MG/ML  SOLN
50.0000 mL | Freq: Once | INTRAMUSCULAR | Status: AC | PRN
  Administered 2020-09-30: 13:00:00 17 mL via INTRAVENOUS

## 2020-09-30 MED ORDER — IOHEXOL 300 MG/ML  SOLN
100.0000 mL | Freq: Once | INTRAMUSCULAR | Status: AC | PRN
  Administered 2020-09-30: 13:00:00 60 mL via INTRAVENOUS

## 2020-09-30 NOTE — Consult Note (Signed)
Chief Complaint: Patient was seen in consultation today for acute DVT, GI bleed  Referring Physician(s): Dr. Karleen Hampshire  Supervising Physician: Sandi Mariscal  Patient Status: [facility] - In-pt  History of Present Illness: Edward Wells is a 74 y.o. male with past medical history of HTN, chronic H pylori who was found down at home after EMS was dispatched for welfare check.  He was admitted with sepsis, Enterobacter and E coli bacteremia, hypothermia, hypotension due to pyelonephritis. He underwent cystoscopy and R ureteral stent placement with good response. He was noted to have unilateral leg swelling with RLE edema 09/23/20  He was recovering well until  he was doing well, however the day of 09/23/20 when he developed lethargy, hypotension. Found to have a GI bleed.  His heparin was stopped.  This morning, after several days off anticoagulation, he had another drop in Hgb requiring blood transfusion.  Patient will need to remain off blood thinners at this time.  IVC filter requested for DVT protection.   Patient assessed at bedside.  He has been NPO this AM.  He is familiar with IVC filter as this has previously been discussed with him by another provider.  He asks appropriate questions and requests I call his niece to inform her of need for filter.   Past Medical History:  Diagnosis Date  . Acute gastric ulcer with hemorrhage 07/30/2014  . Hypertension   . Sleep apnea     Past Surgical History:  Procedure Laterality Date  . BIOPSY  09/26/2020   Procedure: BIOPSY;  Surgeon: Ladene Artist, MD;  Location: [facility];  Service: Gastroenterology;;  . COLONOSCOPY    . CYSTOSCOPY W/ URETERAL STENT PLACEMENT Right 09/18/2020   Procedure: CYSTOSCOPY WITH RETROGRADE PYELOGRAM/URETERAL STENT PLACEMENT;  Surgeon: Raynelle Bring, MD;  Location: [facility];  Service: Urology;  Laterality: Right;  . ESOPHAGOGASTRODUODENOSCOPY N/A 07/31/2014   Procedure: ESOPHAGOGASTRODUODENOSCOPY (EGD);  Surgeon: Gatha Mayer, MD;  Location: [facility];  Service: Endoscopy;  Laterality: N/A;  . ESOPHAGOGASTRODUODENOSCOPY (EGD) WITH PROPOFOL N/A 09/26/2020   Procedure: ESOPHAGOGASTRODUODENOSCOPY (EGD) WITH PROPOFOL;  Surgeon: Ladene Artist, MD;  Location: [facility];  Service: Gastroenterology;  Laterality: N/A;  . ganglion cyst wrist  1971   right, x 2.  . HAMMER TOE SURGERY  1984   bilateral  . HEMOSTASIS CONTROL  09/26/2020   Procedure: HEMOSTASIS CONTROL;  Surgeon: Ladene Artist, MD;  Location: [facility];  Service: Gastroenterology;;  . KNEE ARTHROSCOPY     left, 2004; right, 2001    Allergies: Furosemide  Medications: Prior to Admission medications   Medication Sig Start Date End Date Taking? Authorizing Provider  acetaminophen (TYLENOL) 325 MG tablet Take 975 mg by mouth daily as needed for mild pain.   Yes [provider]  Coenzyme Q10 (CO Q 10) 100 MG CAPS Take 100 mg by mouth daily.   Yes [provider]  losartan (COZAAR) 50 MG tablet Take 50 mg by mouth daily. 01/18/20  Yes [provider]  Multiple Vitamin (MULTIVITAMIN) tablet Take 1 tablet by mouth daily.   Yes [provider]  tiZANidine (ZANAFLEX) 2 MG tablet Take 1 tablet by mouth 3 (three) times daily as needed for muscle spasms. 02/15/20  Yes [provider]  cefdinir (OMNICEF) 300 MG capsule Take 1 capsule (300 mg total) by mouth 2 (two) times daily. Patient not taking: Reported on 09/18/2020 03/11/20   Orpah Greek, MD     Family History  Problem Relation Age of  Onset  . Hypertension Mother   . Colon cancer Neg Hx   . Stomach cancer Neg Hx   . Esophageal cancer Neg Hx   . Rectal cancer Neg Hx     Social History   Socioeconomic History  . Marital status: Divorced    Spouse name: Not on file  . Number of children: Not on file  . Years of education: Not on file  . Highest education level: Not on file  Occupational History  . Occupation: retired  Tobacco  Use  . Smoking status: Former Smoker    Start date: 10/05/2018  . Smokeless tobacco: Never Used  Substance and Sexual Activity  . Alcohol use: Not Currently    Comment: h/o heavy use; last drank 10/04/2018  . Drug use: No  . Sexual activity: Not on file  Other Topics Concern  . Not on file  Social History Narrative   Retired Set designer.     Moved to [place] ~ 2007.       Would drink 750 ml vodka over the course of 2 weeks.  Quit etoh and cigs September 2015.    Social Determinants of Health   Financial Resource Strain: Not on file  Food Insecurity: Not on file  Transportation Needs: Not on file  Physical Activity: Not on file  Stress: Not on file  Social Connections: Not on file     Review of Systems: A 12 point ROS discussed and pertinent positives are indicated in the HPI above.  All other systems are negative.  Review of Systems  Constitutional: Negative for fatigue and fever.  Respiratory: Negative for cough and shortness of breath.   Gastrointestinal: Negative for abdominal pain, nausea and vomiting.  Genitourinary: Positive for hematuria. Negative for dysuria.  Musculoskeletal: Negative for back pain.  Psychiatric/Behavioral: Negative for behavioral problems and confusion.    Vital Signs: BP 120/68 (BP Location: Left Arm)   Pulse 75   Temp 99.8 F (37.7 C) (Oral)   Resp 14   Ht 6' (1.829 m)   Wt 211 lb 13.8 oz (96.1 kg)   SpO2 100%   BMI 28.73 kg/m   Physical Exam Vitals and nursing note reviewed.  Constitutional:      General: He is not in acute distress.    Appearance: Normal appearance. He is not ill-appearing.  HENT:     Mouth/Throat:     Mouth: Mucous membranes are moist.     Pharynx: Oropharynx is clear.  Cardiovascular:     Rate and Rhythm: Normal rate and regular rhythm.  Pulmonary:     Effort: Pulmonary effort is normal. No respiratory distress.     Breath sounds: Normal breath sounds.  Abdominal:     General: Abdomen is  flat.     Palpations: Abdomen is soft.  Skin:    General: Skin is warm and dry.  Neurological:     General: No focal deficit present.     Mental Status: He is alert and oriented to person, place, and time. Mental status is at baseline.  Psychiatric:        Mood and Affect: Mood normal.        Behavior: Behavior normal.        Thought Content: Thought content normal.        Judgment: Judgment normal.      MD Evaluation Airway: WNL Heart: WNL Abdomen: WNL Chest/ Lungs: WNL ASA  Classification: 3 Mallampati/Airway Score: Two   Imaging: CT ABDOMEN PELVIS  WO CONTRAST  Result Date: 09/18/2020 CLINICAL DATA:  Nausea, vomiting, leukocytosis, altered mental status EXAM: CT ABDOMEN AND PELVIS WITHOUT CONTRAST TECHNIQUE: Multidetector CT imaging of the abdomen and pelvis was performed following the standard protocol without IV contrast. COMPARISON:  None. FINDINGS: Lower chest: Mild bibasilar pulmonary scarring. Cardiac size within normal limits. Trace pericardial fluid is likely physiologic. Hepatobiliary: Multiple simple cysts are seen scattered within the liver. The liver is otherwise unremarkable. Gallbladder unremarkable. No intra or extrahepatic biliary ductal dilation. Pancreas: Unremarkable Spleen: Unremarkable Adrenals/Urinary Tract: The adrenal glands are unremarkable. The kidneys are normal in size and position. There is moderate right hydronephrosis with 3 obstructing calculi noted within the proximal right ureter measuring are 7 x 9 x 13 mm inferiorly, 3 mm in the middle, and 8 x 10 x 15 mm superiorly. There is superimposed bilateral nonobstructing nephrolithiasis, right greater than left, with multiple calculi measuring up to 7 mm. Multiple simple cortical cysts are seen within the kidneys bilaterally. No hydronephrosis on the left. A punctate 2 mm calculus is seen leg dependently within the bladder lumen. The bladder is otherwise unremarkable. Stomach/Bowel: Moderate pancolonic  diverticulosis. The stomach, small bowel, and large bowel are otherwise unremarkable. No evidence of obstruction or focal inflammation. Appendix normal. No free intraperitoneal gas or fluid. Vascular/Lymphatic: Mild aortoiliac atherosclerotic calcification. No aortic aneurysm. No pathologic adenopathy within the abdomen and pelvis. Reproductive: Prostate is unremarkable. Other: Small bilateral fat containing inguinal hernias. Rectum unremarkable. Musculoskeletal: No acute bone abnormality. Degenerative changes are seen within the lumbar spine. IMPRESSION: At least 3 obstructing right proximal ureteral calculi resulting in moderate right hydronephrosis. Calculi measure up to 13 mm and 15 mm in greatest dimension. Superimposed moderate bilateral nonobstructing nephrolithiasis. 2 mm punctate calculus within the bladder lumen. Pancolonic moderate diverticulosis. Aortic Atherosclerosis (ICD10-I70.0). Electronically Signed   By: Helyn Numbers MD   On: 09/18/2020 03:01   US RENAL  Result Date: 09/24/2020 CLINICAL DATA:  Patient diagnosed with renal stones and right hydronephrosis by CT 09/18/2020. Double-J stent was placed 09/22/2020. EXAM: RENAL / URINARY TRACT ULTRASOUND COMPLETE COMPARISON:  CT abdomen and pelvis 09/18/2020. FINDINGS: Right Kidney: Renal measurements: 15.7 x 6.5 x 5.5 = volume: 298 mL. Cortical echogenicity is unremarkable. Two simple cysts are identified measuring 1.4 cm in the upper pole and 2.8 cm in the midpole. There is mild of the right renal pelvis without calyceal dilatation. Renal stone seen on prior CT are difficult to visualize on this examination. Double-J stent noted. Left Kidney: Renal measurements: 12.5 x 5.6 x 5.2 cm = volume: 193 mL. Cortical echogenicity is unremarkable. No hydronephrosis. Two cysts in the upper pole are identified as seen on prior CT. The larger measures 4.4 cm. Bladder: Right double-J ureteral stent is seen. Other: None. IMPRESSION: Mild fullness of the right  renal pelvis without calyceal dilatation. The appearance is improved compared to the prior CT. Right double-J ureteral stent is seen at both the renal pelvis and urinary bladder. Bilateral renal cysts. Increased cortical echogenicity bilaterally suggestive of medical renal disease. Electronically Signed   By: Drusilla Kanner M.D.   On: 09/24/2020 16:39   DG Chest Port 1 View  Result Date: 09/17/2020 CLINICAL DATA:  Possible sepsis EXAM: PORTABLE CHEST 1 VIEW COMPARISON:  02/16/2019 FINDINGS: The heart size and mediastinal contours are within normal limits. Both lungs are clear. The visualized skeletal structures are unremarkable. IMPRESSION: No active disease. Electronically Signed   By: Jasmine Pang M.D.   On: 09/17/2020 22:54  DG C-Arm 1-60 Min-No Report  Result Date: 09/18/2020 Fluoroscopy was utilized by the requesting physician.  No radiographic interpretation.   ECHOCARDIOGRAM COMPLETE  Result Date: 09/24/2020    ECHOCARDIOGRAM REPORT   Patient Name:   DARRIL SULEMAN Date of Exam: 09/24/2020 Medical Rec #:  [id number]    Height:       72.0 in Accession #:    [id number]   Weight:       211.9 lb Date of Birth:  [birth year]     BSA:          2.183 m Patient Age:    75 years     BP:           101/56 mmHg Patient Gender: M            HR:           104 bpm. Exam Location:  Inpatient Procedure: 2D Echo, Cardiac Doppler and Color Doppler Indications:    R07.9* Chest pain, unspecified. Elevated troponin.  History:        Patient has no prior history of Echocardiogram examinations.                 Signs/Symptoms:Chest Pain and Bacteremia; Risk                 Factors:Hypertension. Elevated troponin.  Sonographer:    Roseanna Rainbow RDCS Referring Phys: [place]  Sonographer Comments: Technically difficult study due to poor echo windows. IMPRESSIONS  1. Left ventricular ejection fraction, by estimation, is 65 to 70%. The left ventricle has normal function. The left ventricle has no regional wall motion  abnormalities. There is severe concentric left ventricular hypertrophy. Left ventricular diastolic  parameters are consistent with Grade I diastolic dysfunction (impaired relaxation).  2. Right ventricular systolic function is normal. The right ventricular size is mildly enlarged. There is mildly elevated pulmonary artery systolic pressure. The estimated right ventricular systolic pressure is 0000000 mmHg.  3. Left atrial size was moderately dilated.  4. Trivial to small pericardial effusion. The pericardial effusion is circumferential.  5. The mitral valve is grossly normal. Trivial mitral valve regurgitation.  6. The aortic valve is tricuspid. Aortic valve regurgitation is not visualized. Mild aortic valve sclerosis is present, with no evidence of aortic valve stenosis.  7. The inferior vena cava is dilated in size with >50% respiratory variability, suggesting right atrial pressure of 8 mmHg. Comparison(s): No prior Echocardiogram. FINDINGS  Left Ventricle: Left ventricular ejection fraction, by estimation, is 65 to 70%. The left ventricle has normal function. The left ventricle has no regional wall motion abnormalities. The left ventricular internal cavity size was small. There is severe concentric left ventricular hypertrophy. Left ventricular diastolic parameters are consistent with Grade I diastolic dysfunction (impaired relaxation). Normal left ventricular filling pressure. Right Ventricle: The right ventricular size is mildly enlarged. No increase in right ventricular wall thickness. Right ventricular systolic function is normal. There is mildly elevated pulmonary artery systolic pressure. The tricuspid regurgitant velocity is 2.95 m/s, and with an assumed right atrial pressure of 8 mmHg, the estimated right ventricular systolic pressure is 0000000 mmHg. Left Atrium: Left atrial size was moderately dilated. Right Atrium: Right atrial size was normal in size. Pericardium: Trivial pericardial effusion is present.  The pericardial effusion is circumferential. Mitral Valve: The mitral valve is grossly normal. Trivial mitral valve regurgitation. Tricuspid Valve: The tricuspid valve is grossly normal. Tricuspid valve regurgitation is mild. Aortic Valve: The aortic valve is tricuspid. There  is mild aortic valve annular calcification. Aortic valve regurgitation is not visualized. Mild aortic valve sclerosis is present, with no evidence of aortic valve stenosis. Pulmonic Valve: The pulmonic valve was normal in structure. Pulmonic valve regurgitation is trivial. Aorta: The aortic root and ascending aorta are structurally normal, with no evidence of dilitation. Venous: The inferior vena cava is dilated in size with greater than 50% respiratory variability, suggesting right atrial pressure of 8 mmHg. IAS/Shunts: No atrial level shunt detected by color flow Doppler.  LEFT VENTRICLE PLAX 2D LVIDd:         3.50 cm     Diastology LVIDs:         2.30 cm     LV e' medial:    8.70 cm/s LV PW:         2.10 cm     LV E/e' medial:  6.1 LV IVS:        1.70 cm     LV e' lateral:   9.46 cm/s LVOT diam:     2.50 cm     LV E/e' lateral: 5.6 LV SV:         94 LV SV Index:   43 LVOT Area:     4.91 cm  LV Volumes (MOD) LV vol d, MOD A2C: 60.3 ml LV vol d, MOD A4C: 64.5 ml LV vol s, MOD A2C: 14.2 ml LV vol s, MOD A4C: 16.6 ml LV SV MOD A2C:     46.1 ml LV SV MOD A4C:     64.5 ml LV SV MOD BP:      50.2 ml RIGHT VENTRICLE             IVC RV S prime:     12.80 cm/s  IVC diam: 2.60 cm TAPSE (M-mode): 1.8 cm LEFT ATRIUM           Index      RIGHT ATRIUM           Index LA diam:      3.70 cm 1.69 cm/m RA Area:     25.50 cm LA Vol (A2C): 18.3 ml 8.38 ml/m RA Volume:   84.40 ml  38.66 ml/m LA Vol (A4C): 12.6 ml 5.77 ml/m  AORTIC VALVE LVOT Vmax:   127.00 cm/s LVOT Vmean:  96.000 cm/s LVOT VTI:    0.191 m  AORTA Ao Root diam: 3.70 cm MITRAL VALVE               TRICUSPID VALVE MV Area (PHT): 4.89 cm    TR Peak grad:   34.8 mmHg MV Decel Time: 155 msec     TR Vmax:        295.00 cm/s MV E velocity: 53.10 cm/s MV A velocity: 81.80 cm/s  SHUNTS MV E/A ratio:  0.65        Systemic VTI:  0.19 m                            Systemic Diam: 2.50 cm Lyman Bishop MD Electronically signed by Lyman Bishop MD Signature Date/Time: 09/24/2020/12:42:56 PM    Final    VAS [place] LOWER EXTREMITY VENOUS (DVT)  Result Date: 09/23/2020  Lower Venous DVT Study Indications: Swelling.  Risk Factors: None identified. Limitations: Poor ultrasound/tissue interface. Comparison Study: No prior studies. Performing Technologist: Oliver Hum RVT  Examination Guidelines: A complete evaluation includes B-mode imaging, spectral Doppler, color Doppler, and power Doppler as needed of all  accessible portions of each vessel. Bilateral testing is considered an integral part of a complete examination. Limited examinations for reoccurring indications may be performed as noted. The reflux portion of the exam is performed with the patient in reverse Trendelenburg.  +---------+---------------+---------+-----------+----------+--------------+ RIGHT    CompressibilityPhasicitySpontaneityPropertiesThrombus Aging +---------+---------------+---------+-----------+----------+--------------+ CFV      Partial        No       No                   Acute          +---------+---------------+---------+-----------+----------+--------------+ SFJ      Full                                                        +---------+---------------+---------+-----------+----------+--------------+ FV Prox  Partial        No       No                   Acute          +---------+---------------+---------+-----------+----------+--------------+ FV Mid   None           No       No                   Acute          +---------+---------------+---------+-----------+----------+--------------+ FV DistalNone           No       No                   Acute           +---------+---------------+---------+-----------+----------+--------------+ PFV      Full                                                        +---------+---------------+---------+-----------+----------+--------------+ POP      None           No       No                   Acute          +---------+---------------+---------+-----------+----------+--------------+ PTV      Partial                                      Acute          +---------+---------------+---------+-----------+----------+--------------+ PERO     None                                         Acute          +---------+---------------+---------+-----------+----------+--------------+ Gastroc  Full                                                        +---------+---------------+---------+-----------+----------+--------------+  EIV      Partial        No       No                   Acute          +---------+---------------+---------+-----------+----------+--------------+ CIV                     Yes      Yes                                 +---------+---------------+---------+-----------+----------+--------------+   +----+---------------+---------+-----------+----------+--------------+ LEFTCompressibilityPhasicitySpontaneityPropertiesThrombus Aging +----+---------------+---------+-----------+----------+--------------+ CFV Full           Yes      Yes                                 +----+---------------+---------+-----------+----------+--------------+     Summary: RIGHT: - Findings consistent with acute deep vein thrombosis involving the right external iliac vein, common femoral vein, right femoral vein, right popliteal vein, right posterior tibial veins, and right peroneal veins. - No cystic structure found in the popliteal fossa.   *See table(s) above for measurements and observations. Electronically signed by Deitra Mayo MD on 09/23/2020 at 4:35:04 PM.    Final      Labs:  CBC: Recent Labs    09/27/20 0241 09/28/20 0810 09/29/20 0448 09/30/20 0407  WBC 8.3 7.1 6.4 4.4  HGB 7.9* 7.6* 8.0* 6.8*  HCT 26.1* 25.0* 26.2* 22.0*  PLT 132* 148* 145* 136*    COAGS: Recent Labs    03/11/20 0044 09/18/20 0929  INR 1.2 1.5*  APTT  --  29    BMP: Recent Labs    03/11/20 0044 09/17/20 2314 09/27/20 0241 09/28/20 0810 09/29/20 0448 09/30/20 0407  NA 138   < > 143 140 143 141  K 3.6   < > 4.0 3.4* 3.4* 3.3*  CL 103   < > 113* 111 113* 111  CO2 25   < > 19* 23 24 27   GLUCOSE 137*   < > 184* 151* 134* 148*  BUN 12   < > 16 18 17 18   CALCIUM 9.3   < > 8.4* 8.1* 8.2* 8.0*  CREATININE 1.10   < > 1.24 1.18 1.15 1.03  GFRNONAA >60   < > >60 >60 >60 >60  GFRAA >60  --   --   --   --   --    < > = values in this interval not displayed.    LIVER FUNCTION TESTS: Recent Labs    09/23/20 0509 09/23/20 2224 09/24/20 0111 09/25/20 0629  BILITOT 0.7 0.7 0.3 0.5  AST 31 27 28 26   ALT 39 36 34 32  ALKPHOS 57 55 48 42  PROT 6.1* 5.4* 5.0* 4.9*  ALBUMIN 2.6* 2.5* 2.2* 2.2*    TUMOR MARKERS: No results for input(s): AFPTM, CEA, CA199, CHROMGRNA in the last 8760 hours.  Assessment and Plan: Acute DVT Acute GI bleed Patient admitted with pyelonephritis, sepsis who developed GI bleeding while on heparin. Upper endoscopy found non-bleeding gastric ulcers.  Acute DVT found on lower extremity doppler 09/23/20.  Additional drop in hemoglobin this AM.  Unlikely he will be able to resume anticoagulation in the near future.  IR consulted for IVC filter placement.  Case reviewed and approved by Dr.  Watts.  Mr. Hunsaker has been NPO this AM.   Risks and benefits discussed with the patient including, but not limited to bleeding, infection, contrast induced renal failure, filter fracture or migration which can lead to emergency surgery or even death, strut penetration with damage or irritation to adjacent structures and caval thrombosis.  All of the  patient's questions were answered, patient is agreeable to proceed. Consent signed and in chart.   Thank you for this interesting consult.  I greatly enjoyed meeting Remigio Karaman and look forward to participating in their care.  A copy of this report was sent to the requesting provider on this date.  Electronically Signed: Docia Barrier, PA 09/30/2020, 12:06 PM   I spent a total of 40 Minutes    in face to face in clinical consultation, greater than 50% of which was counseling/coordinating care for acute DVT.

## 2020-09-30 NOTE — Progress Notes (Signed)
Patient transported to IR for IVC filter placement with blood transfusion going

## 2020-09-30 NOTE — Progress Notes (Signed)
PROGRESS NOTE    Edward Wells  [id number] DOB: 1946-06-08 DOA: 09/17/2020 PCP: Elizabeth Palau, MD (Inactive)    Chief Complaint  Patient presents with  . Diarrhea    N/V/D    Brief Narrative:  74 y/o male brought to the ED on 12/14 Admitted for shock due to pyelonephritis, s/p  Right ureteral stent in 12/15, initially started on pressors, and weaned off. He was found to have E coli bacteremia along with DVT of the right lower extremity.. On 12/21 pt became hypotensive, was transferred to ICU , developed malena. Underwent EGD on 12/23 showing non bleeding gastric ulcer on lesser curvature s/p cautery, three non bleeding gastric antral ulcers with no bleeding. Biopsy results are pending. He was started on IV heparin and transitioned to eliquis, on 09/29/2020 but patient's hemoglobin continued to drop its currently at 6.8 this morning.  Eliquis was stopped 1 unit of PRBC transfusion was ordered and IR consulted for IVC filter placement.  Assessment & Plan:   Active Problems:   Anemia   Pyelonephritis   Acute unilateral obstructive uropathy   SIRS (systemic inflammatory response syndrome) (HCC)   Adrenal insufficiency (HCC)   Acute blood loss anemia   Melena   Anticoagulated  Recurrent Hypotension? Circulatory shock: Probably secondary to acute UGIB/ relative adrenal insufficiency super imposed on E Coli bacteremia in the setting of pyelonephritis with obstructive uropathy/ hydronephrosis secondary to nephrolithiasis s/p stent on 12/152021. - weaned off stress dose steroids.  - continue with antibiotics to complete 14 day course.  -Blood pressure parameters are optimal l.     Upper GI bleed with acute blood loss anemia; S/p EGd on 12/23 : non-bleeding gastric ulcer on lesser curvature with pigmented raised material.Treated with bipolar cautery. Three non-bleeding gastric antral ulcers with no stigmata of bleeding.Severe erosive gastropathy  Continue with PPI BID oral  today.  Appreciate gastroenterology recommendations. Biopsy results are pending.  Hemoglobin dropped to 6.8 and she has 1 unit of PRBC and repeat H&H and hold Eliquis.   RLE DVT:  Anticoagulation on hold for anemia/drop in hemoglobin to 6.8.  IR consulted for IVC filter placement.   AKI;  Resolved with hydration, creatinine at 1 today   Thrombocytopenia:  Mild.    Moderate Protein calorie malnutrition:  Supplementation added.   Hypokalemia;  Replaced.    Tea colored Urine:  UA ordered to evaluate for hematuria.  Some RBC 's in the urine. But  No bacteria.    DVT prophylaxis: (scd's) Code Status: (DNR) Family Communication: Discussed with niece over the phone disposition:   Status is: Inpatient  Remains inpatient appropriate because:Unsafe d/c plan, IV treatments appropriate due to intensity of illness or inability to take PO and Inpatient level of care appropriate due to severity of illness   Dispo: The patient is from: Home              Anticipated d/c is to: Home              Anticipated d/c date is: 2 days              Patient currently is not medically stable to d/c.       Consultants:   Gastroenterology  PCCM.  Urology   Procedures: EGD.  Antimicrobials:  Antibiotics Given (last 72 hours)    Date/Time Action Medication Dose Rate   09/27/20 2115 New Bag/Given   ceFAZolin (ANCEF) IVPB 2g/100 mL premix 2 g 200 mL/hr   09/28/20 0500 New Bag/Given  ceFAZolin (ANCEF) IVPB 2g/100 mL premix 2 g 200 mL/hr   09/28/20 1420 New Bag/Given   ceFAZolin (ANCEF) IVPB 2g/100 mL premix 2 g 200 mL/hr   09/28/20 2117 New Bag/Given   ceFAZolin (ANCEF) IVPB 2g/100 mL premix 2 g 200 mL/hr   09/29/20 0536 New Bag/Given   ceFAZolin (ANCEF) IVPB 2g/100 mL premix 2 g 200 mL/hr   09/29/20 1420 New Bag/Given   ceFAZolin (ANCEF) IVPB 2g/100 mL premix 2 g 200 mL/hr   09/29/20 2059 New Bag/Given   ceFAZolin (ANCEF) IVPB 2g/100 mL premix 2 g 200 mL/hr   09/30/20 0533 New  Bag/Given   ceFAZolin (ANCEF) IVPB 2g/100 mL premix 2 g 200 mL/hr          Subjective: Patient denies any bloody stools or blood in the urine.  He denies any nausea vomiting or abdominal pain  Objective: Vitals:   09/30/20 1230 09/30/20 1235 09/30/20 1240 09/30/20 1324  BP: 109/63 112/69 114/64 105/65  Pulse: 77 71 74 68  Resp: 12 (!) 0 (!) 9 15  Temp:    98.4 F (36.9 C)  TempSrc:      SpO2: 98% 98% 97% 100%  Weight:      Height:        Intake/Output Summary (Last 24 hours) at 09/30/2020 1420 Last data filed at 09/30/2020 1225 Gross per 24 hour  Intake 782.55 ml  Output 1500 ml  Net -717.45 ml   Filed Weights   09/18/20 1400 09/23/20 0451 09/26/20 0830  Weight: 94.8 kg 96.1 kg 96.1 kg    Examination:  General exam: Alert, not in any kind of distress Respiratory system: Clear to auscultation bilaterally, no wheezing or rhonchi Cardiovascular system: S1-S2 heard, regular rate rhythm, no JVD, gastrointestinal system: Abdomen is soft, nontender, nondistended, bowel sounds normal, Central nervous system: Alert and oriented, grossly nonfocal Extremities: Trace leg edema skin: No rashes seen Psychiatry: Mood is appropriate    Data Reviewed: I have personally reviewed following labs and imaging studies  CBC: Recent Labs  Lab 09/26/20 0250 09/27/20 0241 09/28/20 0810 09/29/20 0448 09/30/20 0407  WBC 8.6 8.3 7.1 6.4 4.4  NEUTROABS 7.5 7.1 4.6 3.5 2.5  HGB 8.1* 7.9* 7.6* 8.0* 6.8*  HCT 26.2* 26.1* 25.0* 26.2* 22.0*  MCV 85.6 86.7 86.8 88.2 89.1  PLT 131* 132* 148* 145* 136*    Basic Metabolic Panel: Recent Labs  Lab 09/24/20 0111 09/24/20 1505 09/26/20 0250 09/27/20 0241 09/28/20 0810 09/29/20 0448 09/30/20 0407  NA 146*   < > 141 143 140 143 141  K 3.8   < > 4.0 4.0 3.4* 3.4* 3.3*  CL 114*   < > 111 113* 111 113* 111  CO2 25   < > 20* 19* 23 24 27   GLUCOSE 211*   < > 275* 184* 151* 134* 148*  BUN 23   < > 15 16 18 17 18   CREATININE 1.04   < >  1.26* 1.24 1.18 1.15 1.03  CALCIUM 8.1*   < > 8.1* 8.4* 8.1* 8.2* 8.0*  MG 1.4*  --  1.6* 2.1  --   --   --    < > = values in this interval not displayed.    GFR: Estimated Creatinine Clearance: 77.9 mL/min (by C-G formula based on SCr of 1.03 mg/dL).  Liver Function Tests: Recent Labs  Lab 09/23/20 2224 09/24/20 0111 09/25/20 0629  AST 27 28 26   ALT 36 34 32  ALKPHOS 55 48 42  BILITOT  0.7 0.3 0.5  PROT 5.4* 5.0* 4.9*  ALBUMIN 2.5* 2.2* 2.2*    CBG: Recent Labs  Lab 09/29/20 0734 09/29/20 1217 09/29/20 1739 09/29/20 2052 09/30/20 0733  GLUCAP 133* 141* 104* 156* 140*     No results found for this or any previous visit (from the past 240 hour(s)).       Radiology Studies: IR IVC FILTER PLMT / S&I /IMG GUID/MOD SED  Result Date: 09/30/2020 INDICATION: Recent diagnosis of DVT however developed lower GI bleeding following addition of anticoagulation. As such, patient now presents for image guided placement of an IVC filter for temporary caval interruption purposes. EXAM: ULTRASOUND GUIDANCE FOR VASCULAR ACCESS IVC CATHETERIZATION AND VENOGRAM IVC FILTER INSERTION COMPARISON:  CT abdomen and pelvis-09/18/2020 MEDICATIONS: None. ANESTHESIA/SEDATION: Fentanyl 100 mcg IV; Versed 2 mg IV Sedation Time: 14 minutes; The patient was continuously monitored during the procedure by the interventional radiology nurse under my direct supervision. CONTRAST:  75 mL OMNIPAQUE IOHEXOL 300 MG/ML  SOLN FLUOROSCOPY TIME:  1 minute, 18 seconds (99991111 mGy) COMPLICATIONS: None immediate PROCEDURE: Informed consent was obtained from the patient following explanation of the procedure, risks, benefits and alternatives. The patient understands, agrees and consents for the procedure. All questions were addressed. A time out was performed prior to the initiation of the procedure. Maximal barrier sterile technique utilized including caps, mask, sterile gowns, sterile gloves, large sterile drape, hand  hygiene, and Betadine prep. Under sterile condition and local anesthesia, right internal jugular venous access was performed with ultrasound. An ultrasound image was saved and sent to PACS. Over a guidewire, the IVC filter delivery sheath and inner dilator were advanced into the IVC just above the IVC bifurcation. Contrast injection was performed for an IVC venogram. Through the delivery sheath, a retrievable Denali IVC filter was deployed below the level of the renal veins and above the IVC bifurcation. Limited post deployment venacavagram was performed. The delivery sheath was removed and hemostasis was obtained with manual compression. A dressing was placed. The patient tolerated the procedure well without immediate post procedural complication. FINDINGS: The IVC is patent.  No evidence of thrombus, stenosis, or occlusion. 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 Successful placement of the IVC filter below the level of the duplicated/accessory right renal vein. IMPRESSION: Successful ultrasound and fluoroscopically guided placement of an infrarenal retrievable IVC filter via right jugular approach. PLAN: This IVC filter is potentially retrievable. The patient will be assessed for filter retrieval by Interventional Radiology in approximately 8-12 weeks. Further recommendations regarding filter retrieval, continued surveillance or declaration of device permanence, will be made at that time. Electronically Signed   By: Sandi Mariscal M.D.   On: 09/30/2020 14:05        Scheduled Meds: . (feeding supplement) PROSource Plus  30 mL Oral BID BM  . sodium chloride   Intravenous Once  . Chlorhexidine Gluconate Cloth  6 each Topical Daily  . feeding supplement (GLUCERNA SHAKE)  237 mL Oral TID BM  . fentaNYL      . folic acid  1 mg Oral Daily  . insulin aspart  0-15 Units Subcutaneous TID WC  . insulin aspart  0-5 Units Subcutaneous QHS  . lidocaine      . midazolam      . multivitamin with  minerals  1 tablet Oral Daily  . pantoprazole  40 mg Oral BID  . thiamine  100 mg Oral Daily   Continuous Infusions: . sodium chloride Stopped (09/26/20 0812)  .  ceFAZolin (ANCEF) IV 2 g (09/30/20 0533)  . lactated ringers 10 mL/hr at 09/29/20 1039     LOS: 12 days       Hosie Poisson, MD [facility]   To contact the attending provider between 7A-7P or the covering provider during after hours 7P-7A, please log into the web site [email or web address] and access using universal [place] password for that web site. If you do not have the password, please call the hospital operator.  09/30/2020, 2:20 PM

## 2020-09-30 NOTE — Progress Notes (Signed)
CRITICAL VALUE ALERT  Critical Value: Hgb 6.8  Date & Time Notied:  09/30/20 @ 0520 am  Provider Notified. Cliffton Asters, APP  Orders Received/Actions taken: see news orders.

## 2020-09-30 NOTE — Progress Notes (Signed)
PT Cancellation Note  Patient Details Name: Edward Wells MRN: [id number] DOB: October 23, 1945   Cancelled Treatment:     c/m am HgB 6.8                                            c/m pm IVC Filter Placement   Pt has been evaluated with rec for HH PT and amb > 150 feet last PT session.  Will continue to follow.  Rica Koyanagi  PTA Acute  Rehabilitation Services Pager      [phone] Office      [phone]

## 2020-09-30 NOTE — Progress Notes (Signed)
OT Cancellation Note  Patient Details Name: Edward Wells MRN: [id number] DOB: [birth year]   Cancelled Treatment:    Reason Eval/Treat Not Completed: Medical issues which prohibited therapy. Patient with low hemoglobin, then off floor for for IVC filter placement. Will check back as schedule permits.  Marlyce Huge OT OT pager: [phone]   Carmelia Roller 09/30/2020, 2:10 PM

## 2020-10-01 LAB — BASIC METABOLIC PANEL
Anion gap: 6 (ref 5–15)
BUN: 13 mg/dL (ref 8–23)
CO2: 24 mmol/L (ref 22–32)
Calcium: 8.3 mg/dL — ABNORMAL LOW (ref 8.9–10.3)
Chloride: 111 mmol/L (ref 98–111)
Creatinine, Ser: 1.04 mg/dL (ref 0.61–1.24)
GFR, Estimated: >60 mL/min (ref >60)
Glucose, Bld: 130 mg/dL — ABNORMAL HIGH (ref 70–99)
Potassium: 3.8 mmol/L (ref 3.5–5.1)
Sodium: 141 mmol/L (ref 135–145)

## 2020-10-01 LAB — CBC WITH DIFFERENTIAL/PLATELET
Abs Immature Granulocytes: 0.03 10*3/uL (ref 0.00–0.07)
Basophils Absolute: 0 10*3/uL (ref 0.0–0.1)
Basophils Relative: 1 %
Eosinophils Absolute: 0.2 10*3/uL (ref 0.0–0.5)
Eosinophils Relative: 5 %
HCT: 25 % — ABNORMAL LOW (ref 39.0–52.0)
Hemoglobin: 7.7 g/dL — ABNORMAL LOW (ref 13.0–17.0)
Immature Granulocytes: 1 %
Lymphocytes Relative: 30 %
Lymphs Abs: 1.2 10*3/uL (ref 0.7–4.0)
MCH: 27.4 pg (ref 26.0–34.0)
MCHC: 30.8 g/dL (ref 30.0–36.0)
MCV: 89 fL (ref 80.0–100.0)
Monocytes Absolute: 0.4 10*3/uL (ref 0.1–1.0)
Monocytes Relative: 11 %
Neutro Abs: 2.1 10*3/uL (ref 1.7–7.7)
Neutrophils Relative %: 52 %
Platelets: 131 10*3/uL — ABNORMAL LOW (ref 150–400)
RBC: 2.81 MIL/uL — ABNORMAL LOW (ref 4.22–5.81)
RDW: 18.6 % — ABNORMAL HIGH (ref 11.5–15.5)
WBC: 4.1 10*3/uL (ref 4.0–10.5)
nRBC: 0 % (ref 0.0–0.2)

## 2020-10-01 LAB — GLUCOSE, CAPILLARY
Glucose-Capillary: 111 mg/dL — ABNORMAL HIGH (ref 70–99)
Glucose-Capillary: 155 mg/dL — ABNORMAL HIGH (ref 70–99)
Glucose-Capillary: 124 mg/dL — ABNORMAL HIGH (ref 70–99)
Glucose-Capillary: 141 mg/dL — ABNORMAL HIGH (ref 70–99)

## 2020-10-01 LAB — TYPE AND SCREEN
ABO/RH(D): B POS
Antibody Screen: NEGATIVE
Unit division: 0

## 2020-10-01 LAB — BPAM RBC
Blood Product Expiration Date: 202201222359
ISSUE DATE / TIME: 202112270911
Unit Type and Rh: 7300

## 2020-10-01 NOTE — Progress Notes (Addendum)
PROGRESS NOTE    Edward Wells  MMH:[id number] DOB: 02-10-1946 DOA: 09/17/2020 PCP: Burtis Junes, MD (Inactive)    Chief Complaint  Patient presents with  . Diarrhea    N/V/D    Brief Narrative:  74 y/o male brought to the ED on 12/14 Admitted for shock due to pyelonephritis, s/p  Right ureteral stent in 12/15, initially started on pressors, and weaned off. He was found to have E coli bacteremia along with DVT of the right lower extremity.. On 12/21 pt became hypotensive, was transferred to ICU , developed malena. Underwent EGD on 12/23 showing non bleeding gastric ulcer on lesser curvature s/p cautery, three non bleeding gastric antral ulcers with no bleeding. Biopsy results are pending. He was started on IV heparin and transitioned to eliquis, on 09/29/2020 but patient's hemoglobin continued to drop to 6.8. Eliquis was stopped,  1 unit of PRBC transfusion was ordered and IR consulted for IVC filter placement. Repeat hemoglobin stable at 7.7. He underwent IVC filter placement on 09/30/20. He will have completed the last of antibiotics today and possibly discharge in am.  Would recommend holding off on the Eliquis few more days and follow up with GI/ PCP to see when he can start his eliquis.   Assessment & Plan:   Active Problems:   Anemia   Pyelonephritis   Acute unilateral obstructive uropathy   SIRS (systemic inflammatory response syndrome) (HCC)   Adrenal insufficiency (HCC)   Acute blood loss anemia   Melena   Anticoagulated  Recurrent Hypotension? Circulatory shock: Probably secondary to acute UGIB/ relative adrenal insufficiency super imposed on E Coli bacteremia in the setting of pyelonephritis with obstructive uropathy/ hydronephrosis secondary to nephrolithiasis s/p stent on 12/152021. - weaned off stress dose steroids.  - continue with antibiotics to complete 14 day course.  Last day today.  -Blood pressure parameters are optimal.     Upper GI bleed with acute  blood loss anemia; S/p EGD on 12/23 : non-bleeding gastric ulcer on lesser curvature with pigmented raised material.Treated with bipolar cautery. Three non-bleeding gastric antral ulcers with no stigmata of bleeding.Severe erosive gastropathy  Continue with PPI BID .  Appreciate gastroenterology recommendations. Biopsy results show chronic atrophic gastritis with intestinal metaplasia. No malignancy noted. Negative for H PYLORI.  Hemoglobin continued to drop to 6.8 s/p 1 unit of prbc transfusion and repeat H&H is 7.7.    RLE DVT:  Anticoagulation on hold for anemia/drop in hemoglobin to 6.8.  IR consulted for IVC filter placement, filter placed on 09/30/20   AKI;  Resolved with hydration.    Thrombocytopenia:  Mild.    Moderate Protein calorie malnutrition:  Supplementation added.   Hypokalemia;  Replaced.    Tea colored Urine:  UA ordered to evaluate for hematuria.  Some RBC 's in the urine. But  No bacteria.    DVT prophylaxis: (scd's) Code Status: (DNR) Family Communication: Discussed with niece over the phone  disposition:   Status is: Inpatient  Remains inpatient appropriate because:Unsafe d/c plan, IV treatments appropriate due to intensity of illness or inability to take PO and Inpatient level of care appropriate due to severity of illness   Dispo: The patient is from: Home              Anticipated d/c is to: Home              Anticipated d/c date is: 1 day              Patient  currently is not medically stable to d/c.       Consultants:   Gastroenterology  PCCM.  Urology   Procedures: EGD.  Antimicrobials:  Antibiotics Given (last 72 hours)    Date/Time Action Medication Dose Rate   09/28/20 1420 New Bag/Given   ceFAZolin (ANCEF) IVPB 2g/100 mL premix 2 g 200 mL/hr   09/28/20 2117 New Bag/Given   ceFAZolin (ANCEF) IVPB 2g/100 mL premix 2 g 200 mL/hr   09/29/20 0536 New Bag/Given   ceFAZolin (ANCEF) IVPB 2g/100 mL premix 2 g 200 mL/hr    09/29/20 1420 New Bag/Given   ceFAZolin (ANCEF) IVPB 2g/100 mL premix 2 g 200 mL/hr   09/29/20 2059 New Bag/Given   ceFAZolin (ANCEF) IVPB 2g/100 mL premix 2 g 200 mL/hr   09/30/20 0533 New Bag/Given   ceFAZolin (ANCEF) IVPB 2g/100 mL premix 2 g 200 mL/hr   09/30/20 1518 New Bag/Given   ceFAZolin (ANCEF) IVPB 2g/100 mL premix 2 g 200 mL/hr   09/30/20 2140 New Bag/Given   ceFAZolin (ANCEF) IVPB 2g/100 mL premix 2 g 200 mL/hr   10/01/20 0602 New Bag/Given   ceFAZolin (ANCEF) IVPB 2g/100 mL premix 2 g 200 mL/hr          Subjective: Pt denies any nausea, vomiting or abdominal pain.   Objective: Vitals:   09/30/20 1240 09/30/20 1324 09/30/20 2100 10/01/20 0610  BP: 114/64 105/65 110/68 107/66  Pulse: 74 68 72 74  Resp: (!) 9 15 18 18   Temp:  98.4 F (36.9 C) 98.2 F (36.8 C) 98.4 F (36.9 C)  TempSrc:   Oral Oral  SpO2: 97% 100% 100% 100%  Weight:      Height:        Intake/Output Summary (Last 24 hours) at 10/01/2020 1303 Last data filed at 10/01/2020 0943 Gross per 24 hour  Intake 1970.23 ml  Output 2550 ml  Net -579.77 ml   Filed Weights   09/18/20 1400 09/23/20 0451 09/26/20 0830  Weight: 94.8 kg 96.1 kg 96.1 kg    Examination:  General exam: alert and comfortable, not in any distress.  Respiratory system: Clear to auscultation, no wheezing or rhonchi.  Cardiovascular system: S1S2, RRR no JVD.  gastrointestinal system: Abdomen is soft, non tender non distended, bowel sound swnl Central nervous system: Alert and oriented, grossly non focal.  Extremities: leg edema present.  skin: No rashes seen.  Psychiatry: Mood is appropriate    Data Reviewed: I have personally reviewed following labs and imaging studies  CBC: Recent Labs  Lab 09/27/20 0241 09/28/20 0810 09/29/20 0448 09/30/20 0407 09/30/20 2222 10/01/20 0540  WBC 8.3 7.1 6.4 4.4  --  4.1  NEUTROABS 7.1 4.6 3.5 2.5  --  2.1  HGB 7.9* 7.6* 8.0* 6.8* 7.5* 7.7*  HCT 26.1* 25.0* 26.2* 22.0*  24.0* 25.0*  MCV 86.7 86.8 88.2 89.1  --  89.0  PLT 132* 148* 145* 136*  --  131*    Basic Metabolic Panel: Recent Labs  Lab 09/26/20 0250 09/27/20 0241 09/28/20 0810 09/29/20 0448 09/30/20 0407 10/01/20 0540  NA 141 143 140 143 141 141  K 4.0 4.0 3.4* 3.4* 3.3* 3.8  CL 111 113* 111 113* 111 111  CO2 20* 19* 23 24 27 24   GLUCOSE 275* 184* 151* 134* 148* 130*  BUN 15 16 18 17 18 13   CREATININE 1.26* 1.24 1.18 1.15 1.03 1.04  CALCIUM 8.1* 8.4* 8.1* 8.2* 8.0* 8.3*  MG 1.6* 2.1  --   --   --   --  GFR: Estimated Creatinine Clearance: 77.2 mL/min (by C-G formula based on SCr of 1.04 mg/dL).  Liver Function Tests: Recent Labs  Lab 09/25/20 0629  AST 26  ALT 32  ALKPHOS 42  BILITOT 0.5  PROT 4.9*  ALBUMIN 2.2*    CBG: Recent Labs  Lab 09/30/20 0733 09/30/20 1649 09/30/20 2025 10/01/20 0805 10/01/20 1200  GLUCAP 140* 116* 138* 111* 155*     No results found for this or any previous visit (from the past 240 hour(s)).       Radiology Studies: IR IVC FILTER PLMT / S&I /IMG GUID/MOD SED  Result Date: 09/30/2020 INDICATION: Recent diagnosis of DVT however developed lower GI bleeding following addition of anticoagulation. As such, patient now presents for image guided placement of an IVC filter for temporary caval interruption purposes. EXAM: ULTRASOUND GUIDANCE FOR VASCULAR ACCESS IVC CATHETERIZATION AND VENOGRAM IVC FILTER INSERTION COMPARISON:  CT abdomen and pelvis-09/18/2020 MEDICATIONS: None. ANESTHESIA/SEDATION: Fentanyl 100 mcg IV; Versed 2 mg IV Sedation Time: 14 minutes; The patient was continuously monitored during the procedure by the interventional radiology nurse under my direct supervision. CONTRAST:  75 mL OMNIPAQUE IOHEXOL 300 MG/ML  SOLN FLUOROSCOPY TIME:  1 minute, 18 seconds (190 mGy) COMPLICATIONS: None immediate PROCEDURE: Informed consent was obtained from the patient following explanation of the procedure, risks, benefits and alternatives.  The patient understands, agrees and consents for the procedure. All questions were addressed. A time out was performed prior to the initiation of the procedure. Maximal barrier sterile technique utilized including caps, mask, sterile gowns, sterile gloves, large sterile drape, hand hygiene, and Betadine prep. Under sterile condition and local anesthesia, right internal jugular venous access was performed with ultrasound. An ultrasound image was saved and sent to PACS. Over a guidewire, the IVC filter delivery sheath and inner dilator were advanced into the IVC just above the IVC bifurcation. Contrast injection was performed for an IVC venogram. Through the delivery sheath, a retrievable Denali IVC filter was deployed below the level of the renal veins and above the IVC bifurcation. Limited post deployment venacavagram was performed. The delivery sheath was removed and hemostasis was obtained with manual compression. A dressing was placed. The patient tolerated the procedure well without immediate post procedural complication. FINDINGS: The IVC is patent.  No evidence of thrombus, stenosis, or occlusion. 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 Successful placement of the IVC filter below the level of the duplicated/accessory right renal vein. IMPRESSION: Successful ultrasound and fluoroscopically guided placement of an infrarenal retrievable IVC filter via right jugular approach. PLAN: This IVC filter is potentially retrievable. The patient will be assessed for filter retrieval by Interventional Radiology in approximately 8-12 weeks. Further recommendations regarding filter retrieval, continued surveillance or declaration of device permanence, will be made at that time. Electronically Signed   By: Simonne Come M.D.   On: 09/30/2020 14:05        Scheduled Meds: . (feeding supplement) PROSource Plus  30 mL Oral BID BM  . sodium chloride   Intravenous Once  . Chlorhexidine Gluconate Cloth  6  each Topical Daily  . feeding supplement (GLUCERNA SHAKE)  237 mL Oral TID BM  . folic acid  1 mg Oral Daily  . insulin aspart  0-15 Units Subcutaneous TID WC  . insulin aspart  0-5 Units Subcutaneous QHS  . multivitamin with minerals  1 tablet Oral Daily  . pantoprazole  40 mg Oral BID  . thiamine  100 mg Oral Daily   Continuous  Infusions: . sodium chloride Stopped (09/26/20 0812)  .  ceFAZolin (ANCEF) IV 2 g (10/01/20 0602)  . lactated ringers 10 mL/hr at 09/29/20 1039     LOS: 13 days       Hosie Poisson, MD [facility]   To contact the attending provider between 7A-7P or the covering provider during after hours 7P-7A, please log into the web site [email or web address] and access using universal [place] password for that web site. If you do not have the password, please call the hospital operator.  10/01/2020, 1:03 PM

## 2020-10-01 NOTE — Progress Notes (Signed)
Occupational Therapy Treatment Patient Details Name: Edward Wells MRN: [id number] DOB: January 09, 1946 Today's Date: 10/01/2020    History of present illness 74 yo who presented with septic shock from pyelonephritis, later undergoing cystoscopy and R ureteral stent placement 12/15. Transferred to ICU after rapid response and hypotensive emergencyy on 12/20. s/p EGD 12/23   OT comments  Pt progressing towards acute OT goals. Reports he recently walked in the halls with PT and tolerated well. Min guard to walk in the room and complete functional transfers. Pt noted to seek at least single extremity support in standing. Appears to have improved from last OT session, will continue to follow acutely. D/c plan remains appropriate.    Follow Up Recommendations  Home health OT    Equipment Recommendations  Toilet rise with handles    Recommendations for Other Services      Precautions / Restrictions Precautions Precautions: ICD/Pacemaker Precaution Comments: monitor HR Restrictions Weight Bearing Restrictions: No       Mobility Bed Mobility               General bed mobility comments: OOB during session  Transfers Overall transfer level: Needs assistance Equipment used: Rolling walker (2 wheeled) Transfers: Sit to/from Stand Sit to Stand: Min guard         General transfer comment: min guard for safety. to/from recliner.    Balance Overall balance assessment: Needs assistance Sitting-balance support: No upper extremity supported;Feet supported Sitting balance-Leahy Scale: Good     Standing balance support: Bilateral upper extremity supported;During functional activity Standing balance-Leahy Scale: Fair Standing balance comment: seeks at least single extremity support                           ADL either performed or assessed with clinical judgement   ADL Overall ADL's : Needs assistance/impaired     Grooming: Wash/dry hands;Min Personnel officer: Min guard;Ambulation;RW           Functional mobility during ADLs: Min guard;Rolling walker General ADL Comments: Pt completed in room functinoal mobility. Reports he just walked the halls with PT. Trialed walking from bathroom to recliner without rw. Pt seeking single extremity support for OOB activity at this time. Tolerated session well     Vision       Perception     Praxis      Cognition Arousal/Alertness: Awake/alert Behavior During Therapy: WFL for tasks assessed/performed Overall Cognitive Status: Within Functional Limits for tasks assessed                                 General Comments: AxO x 3 very pleasant        Exercises     Shoulder Instructions       General Comments      Pertinent Vitals/ Pain       Pain Assessment: No/denies pain  Home Living                                          Prior Functioning/Environment              Frequency  Min 2X/week        Progress Toward Goals  OT Goals(current goals can now be found in the care plan section)  Progress towards OT goals: Progressing toward goals  Acute Rehab OT Goals Patient Stated Goal: to get stronger OT Goal Formulation: With patient Time For Goal Achievement: 10/05/20 Potential to Achieve Goals: Good ADL Goals Pt Will Transfer to Toilet: with modified independence;ambulating;regular height toilet;grab bars Pt Will Perform Toileting - Clothing Manipulation and hygiene: with modified independence;sit to/from stand Additional ADL Goal #1: Patient will perform 10 min functional activity or exercise activity as evidence of improving activity tolerance-  Plan Discharge plan remains appropriate    Co-evaluation                 AM-PAC OT "6 Clicks" Daily Activity     Outcome Measure   Help from another person eating meals?: None Help from another person taking care of personal grooming?: None Help from  another person toileting, which includes using toliet, bedpan, or urinal?: A Little Help from another person bathing (including washing, rinsing, drying)?: A Little Help from another person to put on and taking off regular upper body clothing?: None Help from another person to put on and taking off regular lower body clothing?: A Little 6 Click Score: 21    End of Session Equipment Utilized During Treatment: Rolling walker  OT Visit Diagnosis: Muscle weakness (generalized) (M62.81)   Activity Tolerance Patient tolerated treatment well   Patient Left in chair;with call bell/phone within reach   Nurse Communication          Time: [id number] OT Time Calculation (min): 20 min  Charges: OT General Charges $OT Visit: 1 Visit OT Treatments $Self Care/Home Management : 8-22 mins  Tyrone Schimke, OT Acute Rehabilitation Services Pager: [phone] Office: [phone]    Hortencia Pilar 10/01/2020, 11:56 AM

## 2020-10-01 NOTE — Progress Notes (Signed)
Physical Therapy Treatment Patient Details Name: Edward Wells MRN: [id number] DOB: Feb 19, 1946 Today's Date: 10/01/2020    History of Present Illness 74 yo who presented with septic shock from pyelonephritis, later undergoing cystoscopy and R ureteral stent placement 12/15. Transferred to ICU after rapid response and hypotensive emergencyy on 12/20. s/p EGD 12/23    PT Comments    Pt feeling much better and cognition appears at prior level.  Assisted with amb a greater distance in hallway.  General Gait Details: first amb with walker which pt tolerated well and appeared to use lightly.  Second amb without an AD.  Present with decreased stride length and speed but no over LOB or sruggle.  Rec for walker for community use, no need in home. Per Niece, she plans to relocate her Uncle to her home  Follow Up Recommendations  Home health PT     Equipment Recommendations  Rolling walker with 5" wheels (was delivered and in room)    Recommendations for Other Services       Precautions / Restrictions Precautions Precautions: ICD/Pacemaker Precaution Comments: monitor HR Restrictions Weight Bearing Restrictions: No    Mobility  Bed Mobility               General bed mobility comments: OOB in recliner  Transfers Overall transfer level: Needs assistance Equipment used: Rolling walker (2 wheeled);None Transfers: Sit to/from [employer] to Stand: Supervision Stand pivot transfers: Supervision       General transfer comment: good safety cognition and use of hands to steady self  Ambulation/Gait Ambulation/Gait assistance: Supervision Gait Distance (Feet): 285 Feet Assistive device: Rolling walker (2 wheeled);None Gait Pattern/deviations: Step-through pattern;Decreased stride length;Step-to pattern Gait velocity: decr   General Gait Details: first amb with walker which pt tolerated well and appeared to use lightly.  Second amb without an AD.  Present  with decreased stride length and speed but no over LOB or sruggle.  Rec for walker for community use, no need in home.   Stairs Stairs: Yes Stairs assistance: Supervision;Min guard Stair Management: No rails;Step to pattern;Forwards;With walker Number of Stairs: 1 General stair comments: practiced one step twice with walker and 25% VC's on proper tech/sequencing   Wheelchair Mobility    Modified Rankin (Stroke Patients Only)       Balance Overall balance assessment: Needs assistance Sitting-balance support: No upper extremity supported;Feet supported Sitting balance-Leahy Scale: Good     Standing balance support: Bilateral upper extremity supported;During functional activity Standing balance-Leahy Scale: Fair Standing balance comment: seeks at least single extremity support                            Cognition Arousal/Alertness: Awake/alert Behavior During Therapy: WFL for tasks assessed/performed Overall Cognitive Status: Within Functional Limits for tasks assessed                                 General Comments: AxO x 3 very pleasant      Exercises      General Comments        Pertinent Vitals/Pain Pain Assessment: No/denies pain    Home Living                      Prior Function            PT Goals (current goals can now be found in the care  plan section) Acute Rehab PT Goals Patient Stated Goal: to get stronger Progress towards PT goals: Progressing toward goals    Frequency    Min 3X/week      PT Plan Current plan remains appropriate    Co-evaluation              AM-PAC PT "6 Clicks" Mobility   Outcome Measure  Help needed turning from your back to your side while in a flat bed without using bedrails?: None Help needed moving from lying on your back to sitting on the side of a flat bed without using bedrails?: None Help needed moving to and from a bed to a chair (including a wheelchair)?:  None Help needed standing up from a chair using your arms (e.g., wheelchair or bedside chair)?: None Help needed to walk in hospital room?: None Help needed climbing 3-5 steps with a railing? : A Little 6 Click Score: 23    End of Session Equipment Utilized During Treatment: Gait belt Activity Tolerance: Patient tolerated treatment well Patient left: in chair;with call bell/phone within reach Nurse Communication: Mobility status PT Visit Diagnosis: Muscle weakness (generalized) (M62.81)     Time: [id number] PT Time Calculation (min) (ACUTE ONLY): 14 min  Charges:  $Gait Training: 8-22 mins                     Rica Koyanagi  PTA Acute  Rehabilitation Services Pager      [phone] Office      [phone]

## 2020-10-02 ENCOUNTER — Telehealth: Payer: Self-pay

## 2020-10-02 DIAGNOSIS — I824Z1 Acute embolism and thrombosis of unspecified deep veins of right distal lower extremity: Secondary | ICD-10-CM

## 2020-10-02 DIAGNOSIS — N179 Acute kidney failure, unspecified: Secondary | ICD-10-CM | POA: Diagnosis not present

## 2020-10-02 DIAGNOSIS — A419 Sepsis, unspecified organism: Secondary | ICD-10-CM | POA: Diagnosis not present

## 2020-10-02 DIAGNOSIS — I82401 Acute embolism and thrombosis of unspecified deep veins of right lower extremity: Secondary | ICD-10-CM | POA: Diagnosis present

## 2020-10-02 DIAGNOSIS — N132 Hydronephrosis with renal and ureteral calculous obstruction: Secondary | ICD-10-CM

## 2020-10-02 DIAGNOSIS — R7881 Bacteremia: Secondary | ICD-10-CM

## 2020-10-02 DIAGNOSIS — B9620 Unspecified Escherichia coli [E. coli] as the cause of diseases classified elsewhere: Secondary | ICD-10-CM

## 2020-10-02 DIAGNOSIS — D62 Acute posthemorrhagic anemia: Secondary | ICD-10-CM | POA: Diagnosis not present

## 2020-10-02 DIAGNOSIS — R6521 Severe sepsis with septic shock: Secondary | ICD-10-CM

## 2020-10-02 DIAGNOSIS — K922 Gastrointestinal hemorrhage, unspecified: Secondary | ICD-10-CM

## 2020-10-02 LAB — CBC WITH DIFFERENTIAL/PLATELET
Abs Immature Granulocytes: 0.03 10*3/uL (ref 0.00–0.07)
Basophils Absolute: 0 10*3/uL (ref 0.0–0.1)
Basophils Relative: 0 %
Eosinophils Absolute: 0.2 10*3/uL (ref 0.0–0.5)
Eosinophils Relative: 5 %
HCT: 25.3 % — ABNORMAL LOW (ref 39.0–52.0)
Hemoglobin: 7.6 g/dL — ABNORMAL LOW (ref 13.0–17.0)
Immature Granulocytes: 1 %
Lymphocytes Relative: 33 %
Lymphs Abs: 1.3 10*3/uL (ref 0.7–4.0)
MCH: 27.3 pg (ref 26.0–34.0)
MCHC: 30 g/dL (ref 30.0–36.0)
MCV: 91 fL (ref 80.0–100.0)
Monocytes Absolute: 0.5 10*3/uL (ref 0.1–1.0)
Monocytes Relative: 13 %
Neutro Abs: 1.9 10*3/uL (ref 1.7–7.7)
Neutrophils Relative %: 48 %
Platelets: 136 10*3/uL — ABNORMAL LOW (ref 150–400)
RBC: 2.78 MIL/uL — ABNORMAL LOW (ref 4.22–5.81)
RDW: 18.8 % — ABNORMAL HIGH (ref 11.5–15.5)
WBC: 3.9 10*3/uL — ABNORMAL LOW (ref 4.0–10.5)
nRBC: 0 % (ref 0.0–0.2)

## 2020-10-02 LAB — GLUCOSE, CAPILLARY
Glucose-Capillary: 126 mg/dL — ABNORMAL HIGH (ref 70–99)
Glucose-Capillary: 150 mg/dL — ABNORMAL HIGH (ref 70–99)

## 2020-10-02 LAB — PSA, TOTAL AND FREE
PSA, Free Pct: 15 %
PSA, Free: 0.06 ng/mL
Prostate Specific Ag, Serum: 0.4 ng/mL (ref 0.0–4.0)

## 2020-10-02 MED ORDER — THIAMINE HCL 100 MG PO TABS: 100.0000 mg | ORAL_TABLET | Freq: Every day | ORAL | Status: AC

## 2020-10-02 MED ORDER — FOLIC ACID 1 MG PO TABS: 1.0000 mg | ORAL_TABLET | Freq: Every day | ORAL | Status: AC

## 2020-10-02 MED ORDER — PANTOPRAZOLE SODIUM 40 MG PO TBEC: 40.0000 mg | DELAYED_RELEASE_TABLET | Freq: Two times a day (BID) | ORAL | 2 refills | Status: AC

## 2020-10-02 NOTE — Telephone Encounter (Signed)
Patient discharged today  Scheduled for next available appt w/[facility] in February.:11-27-20 at 10:30am. Call pt to confirm appointment.

## 2020-10-02 NOTE — Discharge Summary (Signed)
Physician Discharge Summary  Edward Wells [id number] DOB: [birth year] DOA: 09/17/2020  PCP: Elizabeth Palau, MD (Inactive)  Admit date: 09/17/2020 Discharge date: 10/02/2020  Time spent: 55 minutes  Recommendations for Outpatient Follow-up:  1. Follow-up with Dr. Havery Moros, gastroenterology in 2 to 3 weeks. 2. Follow-up with Kennon Holter Denton Meek, MD (Inactive) in 2 weeks.  On follow-up patient need a CBC done to follow-up on H&H.  Patient also need a basic metabolic profile done to follow-up on electrolytes and renal function. 3. Follow-up with Dr. Alinda Money, urology in 2 weeks. 4. Patient was discharged home with home health therapies.   Discharge Diagnoses:  Principal Problem:   Severe sepsis with septic shock ([place]) Active Problems:   E coli bacteremia   Acute blood loss anemia   Upper GI bleed   Anemia   Sepsis secondary to UTI (HCC)   Pyelonephritis   Acute unilateral obstructive uropathy   SIRS (systemic inflammatory response syndrome) (HCC)   Adrenal insufficiency (HCC)   Melena   Anticoagulated   Hypomagnesemia   Acute deep vein thrombosis (DVT) of right lower extremity ([place])   Discharge Condition: stable and improved.  Diet recommendation: Heart Healthy  Filed Weights   09/18/20 1400 09/23/20 0451 09/26/20 0830  Weight: 94.8 kg 96.1 kg 96.1 kg    History of present illness:  HPI per Dr. Lynetta Mare 74 year old man who presented with hypotension.   EMS dispatched as part of welfare check as patient did not answer neighbors' calls.   Found on floor with a trail of diarrhea from bathroom to where is was lying. Initially. Patient was initally hypotensive 88/40 and lethargic. NS 1571ml bolus given. Patient more alert and oriented and BP 120/80.   In ED, found to be hypothermic and Code Sepsis called. Lactate elevated at 3.1.  Given 4.5L of fluids - improved BP, but remains tachycardic.  Hyperglycemic with new AKI (baseline Creatinine 1.10 06/21)  CT  abdomen shows multiple stones on right side with moderate hydronephrosis  Hospital Course:  #1 recurrent hypotension/secretory shock severe sepsis secondary to E. coli bacteremia/pyelonephritis with obstructive uropathy/hydronephrosis secondary to nephrolithiasis status post stents 09/18/2020 Patient presented with recurrent hypotension/circulatory shock source felt to be secondary to urinary tract, patient pancultured blood cultures positive for E. coli bacteremia, patient with a pyelonephritis and obstructive uropathy with hydronephrosis secondary to nephrolithiasis status post stent placement 09/18/2020.  Patient initially required pressors which was subsequently weaned off.  Patient initially transferred to the [facility] hospitalist service however due to ongoing hypotension and concern for melanotic stools patient transferred back to critical care service.  Stress dose steroids which was subsequently weaned off.  Patient maintained empirically on IV antibiotics and completed a 14-day course of antibiotic treatment.  Blood pressure remained stable.  Outpatient follow-up.  2.  Upper GI bleed/acute blood loss anemia Patient noted to go back into the secretory shock with concerns for melanotic stools.  GI consulted.  Patient underwent EGD 09/26/2020 that showed a nonbleeding gastric ulcer on the lesser curvature with pigmented raised material status post bipolar cautery.  Also noted was 3 nonbleeding gastric antral ulcers with no stigmata of bleeding.  Severe erosive gastropathy.  Patient initially placed on a Protonix drip and transition to IV PPI twice daily and subsequently to oral PPI twice daily.  Biopsies which were done showed chronic atrophic gastritis with intestinal metaplasia, no malignancy noted.  Negative for H. pylori.  Hemoglobin dropped to 6.8 patient transfused 1 unit of packed red blood cells with  hemoglobin remaining stable at 7.6 by day of discharge.  Patient be discharged on PPI twice daily  with outpatient follow-up with GI in 2 to 3 weeks.  3.  Right lower extremity DVT Due to anemia, drop in hemoglobin and GI bleed anticoagulation held during the hospitalization.  IR was consulted and patient underwent IVC filter placement on 09/30/2020.  Anticoagulation discontinued on discharge.  Outpatient follow-up with PCP, IR, GI.  Resumption of anticoagulation will be deferred to the outpatient setting.  4.  Acute kidney injury Resolved with hydration.  5.  Mild thrombocytopenia Stable.  6.  Moderate protein calorie malnutrition Patient maintained on nutritional supplementation.  7.  Hypokalemia Repleted.  Significant Hospital Events    12/14 admitted. treated for septic shock [place] source. Broad spec abx started.  12/15 had right ureteral stent placed. Weaned off pressors  12/16 Cultures positive for e-coli, hemodynamically stable. Amiodarone stopped, in SR.  12/17 To [facility]  12/21 PCCM called back for hypotension, ? Melena, tmax 100.6 / WBC 12.3, cortisol 13 12/22 Neo down to 40 mcg, stress dose steroids started, pending EGD, heparin on hold.  12/23 EGD: One non-bleeding gastric ulcer on lesser curvature with pigmented raised material.Treated with bipolar cautery. - Three non-bleeding gastric antral ulcers with no stigmata of bleeding. - Severe erosive gastropathy with no bleeding and no stigmata of recent bleeding. Biopsied. - Duodenal erosions without bleeding. Recommendations: Clear liquid diet for now. - Continue present medications. - IV pantoprazole infusion for 3 days then pantoprazole 40 mg IV or PO bid. - No aspirin, ibuprofen, naproxen, or other non-steroidal anti-inflammatory drugs long term. - Await pathology results. - Resume heparin at prior dose 12/24 per primary service as indicated.    Procedures: Significant Diagnostic Tests:  12/15 ct abdomen> 3 obstructing right proximal ureteral calculi causing moderate right hydronephrosis, pancolonic moderate  diverticulosis  EGD 09/26/2020 per Dr. Fuller Plan  IVC filter placement 09/30/2020 per IR    Consultations:  Interventional radiology: Dr. Pascal Lux 09/30/2020  Gastroenterology: Dr. Havery Moros 09/25/2020  Urology: Dr. Alinda Money 09/18/2020  Interventional radiology  Discharge Exam: Vitals:   10/02/20 0453 10/02/20 1349  BP: (!) 107/57 116/68  Pulse: 75 66  Resp:  18  Temp: 99.6 F (37.6 C) 98.9 F (37.2 C)  SpO2: 100% 100%    General: NAD Cardiovascular: RRR Respiratory: CTAB  Discharge Instructions   Discharge Instructions    Diet - low sodium heart healthy   Complete by: As directed    Increase activity slowly   Complete by: As directed      Allergies as of 10/02/2020      Reactions   Furosemide    Muscle cramps      Medication List    STOP taking these medications   cefdinir 300 MG capsule Commonly known as: OMNICEF   losartan 50 MG tablet Commonly known as: COZAAR     TAKE these medications   acetaminophen 325 MG tablet Commonly known as: TYLENOL Take 975 mg by mouth daily as needed for mild pain.   Co Q 10 100 MG Caps Take 100 mg by mouth daily.   folic acid 1 MG tablet Commonly known as: FOLVITE Take 1 tablet (1 mg total) by mouth daily. Start taking on: October 03, 2020   multivitamin tablet Take 1 tablet by mouth daily.   pantoprazole 40 MG tablet Commonly known as: PROTONIX Take 1 tablet (40 mg total) by mouth 2 (two) times daily.   thiamine 100 MG tablet Take 1 tablet (100  mg total) by mouth daily. Start taking on: October 03, 2020   tiZANidine 2 MG tablet Commonly known as: ZANAFLEX Take 1 tablet by mouth 3 (three) times daily as needed for muscle spasms.            Durable Medical Equipment  (From admission, onward)         Start     Ordered   09/23/20 1429  For home use only DME Walker rolling  Once       Question Answer Comment  Walker: With 5 Inch Wheels   Patient needs a walker to treat with the following  condition Weakness      09/23/20 1428         Allergies  Allergen Reactions  . Furosemide     Muscle cramps    Follow-up Information    [facility], [facility] Follow up.   Why: agency will provide home health physical therapy. Contact information: [place] [address] [phone]        Armbruster, Carlota Raspberry, MD. Schedule an appointment as soon as possible for a visit in 2 week(s).   Specialty: Gastroenterology Why: f/u in 2-3 weeks. Contact information: [place] [address] [phone]        Elizabeth Palau, MD. Schedule an appointment as soon as possible for a visit in 2 week(s).   Specialty: Family Medicine Contact information: [place] [address] [phone]        Raynelle Bring, MD. Schedule an appointment as soon as possible for a visit in 2 week(s).   Specialty: Urology Why: f/u in 2 weeks Contact information: [place] [address] [phone]                The results of significant diagnostics from this hospitalization (including imaging, microbiology, ancillary and laboratory) are listed below for reference.    Significant Diagnostic Studies: CT ABDOMEN PELVIS WO CONTRAST  Result Date: 09/18/2020 CLINICAL DATA:  Nausea, vomiting, leukocytosis, altered mental status EXAM: CT ABDOMEN AND PELVIS WITHOUT CONTRAST TECHNIQUE: Multidetector CT imaging of the abdomen and pelvis was performed following the standard protocol without IV contrast. COMPARISON:  None. FINDINGS: Lower chest: Mild bibasilar pulmonary scarring. Cardiac size within normal limits. Trace pericardial fluid is likely physiologic. Hepatobiliary: Multiple simple cysts are seen scattered within the liver. The liver is otherwise unremarkable. Gallbladder unremarkable. No intra or extrahepatic biliary ductal dilation. Pancreas: Unremarkable Spleen: Unremarkable  Adrenals/Urinary Tract: The adrenal glands are unremarkable. The kidneys are normal in size and position. There is moderate right hydronephrosis with 3 obstructing calculi noted within the proximal right ureter measuring are 7 x 9 x 13 mm inferiorly, 3 mm in the middle, and 8 x 10 x 15 mm superiorly. There is superimposed bilateral nonobstructing nephrolithiasis, right greater than left, with multiple calculi measuring up to 7 mm. Multiple simple cortical cysts are seen within the kidneys bilaterally. No hydronephrosis on the left. A punctate 2 mm calculus is seen leg dependently within the bladder lumen. The bladder is otherwise unremarkable. Stomach/Bowel: Moderate pancolonic diverticulosis. The stomach, small bowel, and large bowel are otherwise unremarkable. No evidence of obstruction or focal inflammation. Appendix normal. No free intraperitoneal gas or fluid. Vascular/Lymphatic: Mild aortoiliac atherosclerotic calcification. No aortic aneurysm. No pathologic adenopathy within the abdomen and pelvis. Reproductive: Prostate is unremarkable. Other: Small bilateral fat containing inguinal hernias. Rectum unremarkable. Musculoskeletal: No acute  bone abnormality. Degenerative changes are seen within the lumbar spine. IMPRESSION: At least 3 obstructing right proximal ureteral calculi resulting in moderate right hydronephrosis. Calculi measure up to 13 mm and 15 mm in greatest dimension. Superimposed moderate bilateral nonobstructing nephrolithiasis. 2 mm punctate calculus within the bladder lumen. Pancolonic moderate diverticulosis. Aortic Atherosclerosis (ICD10-I70.0). Electronically Signed   By: Fidela Salisbury MD   On: 09/18/2020 03:01   IR IVC FILTER PLMT / S&I Burke Keels GUID/MOD SED  Result Date: 09/30/2020 INDICATION: Recent diagnosis of DVT however developed lower GI bleeding following addition of anticoagulation. As such, patient now presents for image guided placement of an IVC filter for temporary caval  interruption purposes. EXAM: ULTRASOUND GUIDANCE FOR VASCULAR ACCESS IVC CATHETERIZATION AND VENOGRAM IVC FILTER INSERTION COMPARISON:  CT abdomen and pelvis-09/18/2020 MEDICATIONS: None. ANESTHESIA/SEDATION: Fentanyl 100 mcg IV; Versed 2 mg IV Sedation Time: 14 minutes; The patient was continuously monitored during the procedure by the interventional radiology nurse under my direct supervision. CONTRAST:  75 mL OMNIPAQUE IOHEXOL 300 MG/ML  SOLN FLUOROSCOPY TIME:  1 minute, 18 seconds (99991111 mGy) COMPLICATIONS: None immediate PROCEDURE: Informed consent was obtained from the patient following explanation of the procedure, risks, benefits and alternatives. The patient understands, agrees and consents for the procedure. All questions were addressed. A time out was performed prior to the initiation of the procedure. Maximal barrier sterile technique utilized including caps, mask, sterile gowns, sterile gloves, large sterile drape, hand hygiene, and Betadine prep. Under sterile condition and local anesthesia, right internal jugular venous access was performed with ultrasound. An ultrasound image was saved and sent to PACS. Over a guidewire, the IVC filter delivery sheath and inner dilator were advanced into the IVC just above the IVC bifurcation. Contrast injection was performed for an IVC venogram. Through the delivery sheath, a retrievable [place]ali IVC filter was deployed below the level of the renal veins and above the IVC bifurcation. Limited post deployment venacavagram was performed. The delivery sheath was removed and hemostasis was obtained with manual compression. A dressing was placed. The patient tolerated the procedure well without immediate post procedural complication. FINDINGS: The IVC is patent.  No evidence of thrombus, stenosis, or occlusion. 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 Successful placement of the IVC filter below the level of the duplicated/accessory right renal vein.  IMPRESSION: Successful ultrasound and fluoroscopically guided placement of an infrarenal retrievable IVC filter via right jugular approach. PLAN: This IVC filter is potentially retrievable. The patient will be assessed for filter retrieval by Interventional Radiology in approximately 8-12 weeks. Further recommendations regarding filter retrieval, continued surveillance or declaration of device permanence, will be made at that time. Electronically Signed   By: Sandi Mariscal M.D.   On: 09/30/2020 14:05   US RENAL  Result Date: 09/24/2020 CLINICAL DATA:  Patient diagnosed with renal stones and right hydronephrosis by CT 09/18/2020. Double-J stent was placed 09/22/2020. EXAM: RENAL / URINARY TRACT ULTRASOUND COMPLETE COMPARISON:  CT abdomen and pelvis 09/18/2020. FINDINGS: Right Kidney: Renal measurements: 15.7 x 6.5 x 5.5 = volume: 298 mL. Cortical echogenicity is unremarkable. Two simple cysts are identified measuring 1.4 cm in the upper pole and 2.8 cm in the midpole. There is mild of the right renal pelvis without calyceal dilatation. Renal stone seen on prior CT are difficult to visualize on this examination. Double-J stent noted. Left Kidney: Renal measurements: 12.5 x 5.6 x 5.2 cm = volume: 193 mL. Cortical echogenicity is unremarkable. No hydronephrosis. Two cysts in the upper pole  are identified as seen on prior CT. The larger measures 4.4 cm. Bladder: Right double-J ureteral stent is seen. Other: None. IMPRESSION: Mild fullness of the right renal pelvis without calyceal dilatation. The appearance is improved compared to the prior CT. Right double-J ureteral stent is seen at both the renal pelvis and urinary bladder. Bilateral renal cysts. Increased cortical echogenicity bilaterally suggestive of medical renal disease. Electronically Signed   By: Drusilla Kanner M.D.   On: 09/24/2020 16:39   DG Chest Port 1 View  Result Date: 09/17/2020 CLINICAL DATA:  Possible sepsis EXAM: PORTABLE CHEST 1 VIEW  COMPARISON:  02/16/2019 FINDINGS: The heart size and mediastinal contours are within normal limits. Both lungs are clear. The visualized skeletal structures are unremarkable. IMPRESSION: No active disease. Electronically Signed   By: Jasmine Pang M.D.   On: 09/17/2020 22:54   DG C-Arm 1-60 Min-No Report  Result Date: 09/18/2020 Fluoroscopy was utilized by the requesting physician.  No radiographic interpretation.   ECHOCARDIOGRAM COMPLETE  Result Date: 09/24/2020    ECHOCARDIOGRAM REPORT   Patient Name:   Edward Wells Date of Exam: 09/24/2020 Medical Rec #:  [id number]    Height:       72.0 in Accession #:    [id number]   Weight:       211.9 lb Date of Birth:  Jan 09, 1946     BSA:          2.183 m Patient Age:    74 years     BP:           101/56 mmHg Patient Gender: M            HR:           104 bpm. Exam Location:  Inpatient Procedure: 2D Echo, Cardiac Doppler and Color Doppler Indications:    R07.9* Chest pain, unspecified. Elevated troponin.  History:        Patient has no prior history of Echocardiogram examinations.                 Signs/Symptoms:Chest Pain and Bacteremia; Risk                 Factors:Hypertension. Elevated troponin.  Sonographer:    Sheralyn Boatman RDCS Referring Phys: [id number] STEPHEN K CHIU  Sonographer Comments: Technically difficult study due to poor echo windows. IMPRESSIONS  1. Left ventricular ejection fraction, by estimation, is 65 to 70%. The left ventricle has normal function. The left ventricle has no regional wall motion abnormalities. There is severe concentric left ventricular hypertrophy. Left ventricular diastolic  parameters are consistent with Grade I diastolic dysfunction (impaired relaxation).  2. Right ventricular systolic function is normal. The right ventricular size is mildly enlarged. There is mildly elevated pulmonary artery systolic pressure. The estimated right ventricular systolic pressure is 42.8 mmHg.  3. Left atrial size was moderately dilated.  4. Trivial to  small pericardial effusion. The pericardial effusion is circumferential.  5. The mitral valve is grossly normal. Trivial mitral valve regurgitation.  6. The aortic valve is tricuspid. Aortic valve regurgitation is not visualized. Mild aortic valve sclerosis is present, with no evidence of aortic valve stenosis.  7. The inferior vena cava is dilated in size with >50% respiratory variability, suggesting right atrial pressure of 8 mmHg. Comparison(s): No prior Echocardiogram. FINDINGS  Left Ventricle: Left ventricular ejection fraction, by estimation, is 65 to 70%. The left ventricle has normal function. The left ventricle has no regional wall motion abnormalities. The left ventricular internal  cavity size was small. There is severe concentric left ventricular hypertrophy. Left ventricular diastolic parameters are consistent with Grade I diastolic dysfunction (impaired relaxation). Normal left ventricular filling pressure. Right Ventricle: The right ventricular size is mildly enlarged. No increase in right ventricular wall thickness. Right ventricular systolic function is normal. There is mildly elevated pulmonary artery systolic pressure. The tricuspid regurgitant velocity is 2.95 m/s, and with an assumed right atrial pressure of 8 mmHg, the estimated right ventricular systolic pressure is 0000000 mmHg. Left Atrium: Left atrial size was moderately dilated. Right Atrium: Right atrial size was normal in size. Pericardium: Trivial pericardial effusion is present. The pericardial effusion is circumferential. Mitral Valve: The mitral valve is grossly normal. Trivial mitral valve regurgitation. Tricuspid Valve: The tricuspid valve is grossly normal. Tricuspid valve regurgitation is mild. Aortic Valve: The aortic valve is tricuspid. There is mild aortic valve annular calcification. Aortic valve regurgitation is not visualized. Mild aortic valve sclerosis is present, with no evidence of aortic valve stenosis. Pulmonic Valve: The  pulmonic valve was normal in structure. Pulmonic valve regurgitation is trivial. Aorta: The aortic root and ascending aorta are structurally normal, with no evidence of dilitation. Venous: The inferior vena cava is dilated in size with greater than 50% respiratory variability, suggesting right atrial pressure of 8 mmHg. IAS/Shunts: No atrial level shunt detected by color flow Doppler.  LEFT VENTRICLE PLAX 2D LVIDd:         3.50 cm     Diastology LVIDs:         2.30 cm     LV e' medial:    8.70 cm/s LV PW:         2.10 cm     LV E/e' medial:  6.1 LV IVS:        1.70 cm     LV e' lateral:   9.46 cm/s LVOT diam:     2.50 cm     LV E/e' lateral: 5.6 LV SV:         94 LV SV Index:   43 LVOT Area:     4.91 cm  LV Volumes (MOD) LV vol d, MOD A2C: 60.3 ml LV vol d, MOD A4C: 64.5 ml LV vol s, MOD A2C: 14.2 ml LV vol s, MOD A4C: 16.6 ml LV SV MOD A2C:     46.1 ml LV SV MOD A4C:     64.5 ml LV SV MOD BP:      50.2 ml RIGHT VENTRICLE             IVC RV S prime:     12.80 cm/s  IVC diam: 2.60 cm TAPSE (M-mode): 1.8 cm LEFT ATRIUM           Index      RIGHT ATRIUM           Index LA diam:      3.70 cm 1.69 cm/m RA Area:     25.50 cm LA Vol (A2C): 18.3 ml 8.38 ml/m RA Volume:   84.40 ml  38.66 ml/m LA Vol (A4C): 12.6 ml 5.77 ml/m  AORTIC VALVE LVOT Vmax:   127.00 cm/s LVOT Vmean:  96.000 cm/s LVOT VTI:    0.191 m  AORTA Ao Root diam: 3.70 cm MITRAL VALVE               TRICUSPID VALVE MV Area (PHT): 4.89 cm    TR Peak grad:   34.8 mmHg MV Decel Time: 155 msec    TR  Vmax:        295.00 cm/s MV E velocity: 53.10 cm/s MV A velocity: 81.80 cm/s  SHUNTS MV E/A ratio:  0.65        Systemic VTI:  0.19 m                            Systemic Diam: 2.50 cm Lyman Bishop MD Electronically signed by Lyman Bishop MD Signature Date/Time: 09/24/2020/12:42:56 PM    Final    VAS [place] LOWER EXTREMITY VENOUS (DVT)  Result Date: 09/23/2020  Lower Venous DVT Study Indications: Swelling.  Risk Factors: None identified. Limitations: Poor  ultrasound/tissue interface. Comparison Study: No prior studies. Performing Technologist: Oliver Hum RVT  Examination Guidelines: A complete evaluation includes B-mode imaging, spectral Doppler, color Doppler, and power Doppler as needed of all accessible portions of each vessel. Bilateral testing is considered an integral part of a complete examination. Limited examinations for reoccurring indications may be performed as noted. The reflux portion of the exam is performed with the patient in reverse Trendelenburg.  +---------+---------------+---------+-----------+----------+--------------+ RIGHT    CompressibilityPhasicitySpontaneityPropertiesThrombus Aging +---------+---------------+---------+-----------+----------+--------------+ CFV      Partial        No       No                   Acute          +---------+---------------+---------+-----------+----------+--------------+ SFJ      Full                                                        +---------+---------------+---------+-----------+----------+--------------+ FV Prox  Partial        No       No                   Acute          +---------+---------------+---------+-----------+----------+--------------+ FV Mid   None           No       No                   Acute          +---------+---------------+---------+-----------+----------+--------------+ FV DistalNone           No       No                   Acute          +---------+---------------+---------+-----------+----------+--------------+ PFV      Full                                                        +---------+---------------+---------+-----------+----------+--------------+ POP      None           No       No                   Acute          +---------+---------------+---------+-----------+----------+--------------+ PTV      Partial  Acute           +---------+---------------+---------+-----------+----------+--------------+ PERO     None                                         Acute          +---------+---------------+---------+-----------+----------+--------------+ Gastroc  Full                                                        +---------+---------------+---------+-----------+----------+--------------+ EIV      Partial        No       No                   Acute          +---------+---------------+---------+-----------+----------+--------------+ CIV                     Yes      Yes                                 +---------+---------------+---------+-----------+----------+--------------+   +----+---------------+---------+-----------+----------+--------------+ LEFTCompressibilityPhasicitySpontaneityPropertiesThrombus Aging +----+---------------+---------+-----------+----------+--------------+ CFV Full           Yes      Yes                                 +----+---------------+---------+-----------+----------+--------------+     Summary: RIGHT: - Findings consistent with acute deep vein thrombosis involving the right external iliac vein, common femoral vein, right femoral vein, right popliteal vein, right posterior tibial veins, and right peroneal veins. - No cystic structure found in the popliteal fossa.   *See table(s) above for measurements and observations. Electronically signed by Deitra Mayo MD on 09/23/2020 at 4:35:04 PM.    Final     Microbiology: No results found for this or any previous visit (from the past 240 hour(s)).   Labs: Basic Metabolic Panel: Recent Labs  Lab 09/26/20 0250 09/27/20 0241 09/28/20 0810 09/29/20 0448 09/30/20 0407 10/01/20 0540  NA 141 143 140 143 141 141  K 4.0 4.0 3.4* 3.4* 3.3* 3.8  CL 111 113* 111 113* 111 111  CO2 20* 19* 23 24 27 24   GLUCOSE 275* 184* 151* 134* 148* 130*  BUN 15 16 18 17 18 13   CREATININE 1.26* 1.24 1.18 1.15 1.03 1.04  CALCIUM  8.1* 8.4* 8.1* 8.2* 8.0* 8.3*  MG 1.6* 2.1  --   --   --   --    Liver Function Tests: No results for input(s): AST, ALT, ALKPHOS, BILITOT, PROT, ALBUMIN in the last 168 hours. No results for input(s): LIPASE, AMYLASE in the last 168 hours. No results for input(s): AMMONIA in the last 168 hours. CBC: Recent Labs  Lab 09/28/20 0810 09/29/20 0448 09/30/20 0407 09/30/20 2222 10/01/20 0540 10/02/20 0437  WBC 7.1 6.4 4.4  --  4.1 3.9*  NEUTROABS 4.6 3.5 2.5  --  2.1 1.9  HGB 7.6* 8.0* 6.8* 7.5* 7.7* 7.6*  HCT 25.0* 26.2* 22.0* 24.0* 25.0* 25.3*  MCV 86.8 88.2 89.1  --  89.0 91.0  PLT 148* 145* 136*  --  131* 136*   Cardiac Enzymes: No results for input(s): CKTOTAL, CKMB, CKMBINDEX, TROPONINI in the last 168 hours. BNP: BNP (last 3 results) No results for input(s): BNP in the last 8760 hours.  ProBNP (last 3 results) No results for input(s): PROBNP in the last 8760 hours.  CBG: Recent Labs  Lab 10/01/20 1200 10/01/20 1645 10/01/20 2136 10/02/20 0804 10/02/20 1201  GLUCAP 155* 124* 141* 126* 150*       Signed:  Irine Seal MD.  [facility] 10/02/2020, 3:09 PM

## 2020-10-02 NOTE — Progress Notes (Signed)
Discharge instructions discussed with patient and family, verbalized agreement and understanding 

## 2020-10-02 NOTE — Telephone Encounter (Signed)
-----   Message from Benancio Deeds, MD sent at 09/27/2020  6:36 PM EST ----- Regarding: outpatient follow up [facility], This patient will need follow up with me or APP within 6 weeks or so after his discharge. Thank you

## 2020-10-03 DIAGNOSIS — D649 Anemia, unspecified: Secondary | ICD-10-CM | POA: Diagnosis not present

## 2020-10-03 NOTE — Telephone Encounter (Signed)
Spoke to patient's niece who lives in [place] who is the pt's only relative. They are moving him to [facility] to care for him and he will follow with a GI specialist in [place]. They will sign up for MyChart so that they will have access to his records.  We have cancelled his appt for Feb 2022 with Dr. Adela Lank.

## 2020-10-03 NOTE — Telephone Encounter (Signed)
Okay thanks for letting me know

## 2020-10-05 DIAGNOSIS — N1330 Unspecified hydronephrosis: Secondary | ICD-10-CM

## 2020-10-06 ENCOUNTER — Encounter: Payer: Self-pay | Admitting: Gastroenterology

## 2020-10-07 DIAGNOSIS — E119 Type 2 diabetes mellitus without complications: Secondary | ICD-10-CM | POA: Diagnosis not present

## 2020-10-09 DIAGNOSIS — N201 Calculus of ureter: Secondary | ICD-10-CM | POA: Diagnosis not present

## 2020-10-10 ENCOUNTER — Encounter: Payer: Self-pay | Admitting: Neurology

## 2020-10-15 ENCOUNTER — Ambulatory Visit (INDEPENDENT_AMBULATORY_CARE_PROVIDER_SITE_OTHER): Payer: Medicare Other | Admitting: Neurology

## 2020-10-15 ENCOUNTER — Telehealth: Payer: Self-pay | Admitting: Neurology

## 2020-10-15 ENCOUNTER — Encounter: Payer: Self-pay | Admitting: Neurology

## 2020-10-15 VITALS — BP 128/65 | HR 82 | Ht 72.0 in | Wt 211.0 lb

## 2020-10-15 DIAGNOSIS — Z8619 Personal history of other infectious and parasitic diseases: Secondary | ICD-10-CM

## 2020-10-15 DIAGNOSIS — R413 Other amnesia: Secondary | ICD-10-CM

## 2020-10-15 DIAGNOSIS — Z9289 Personal history of other medical treatment: Secondary | ICD-10-CM

## 2020-10-15 DIAGNOSIS — R4189 Other symptoms and signs involving cognitive functions and awareness: Secondary | ICD-10-CM | POA: Diagnosis not present

## 2020-10-15 NOTE — Telephone Encounter (Signed)
Medicare/aetna order sent to GI. They will obtain the auth and reach out to the patient to schedule.

## 2020-10-15 NOTE — Progress Notes (Signed)
Subjective:    Patient ID: Edward Wells is a 75 y.o. male.  HPI     Star Age, MD, PhD [facility] [address] [place], [place] [address]  Dear Caryl Pina,   I saw your patient, Edward Wells, upon your kind request, in my Neurologic clinic today for initial consultation of his memory loss, concern for dementia.  The patient is accompanied by his niece today.  As you know, Edward Wells is a 75 year old right-handed gentleman with an underlying medical history of hypertension, sleep apnea, peptic ulcer disease, and recent hospitalization for severe sepsis with septic shock in December 2021, who reports feeling better with regards to his memory.  He has had memory decline for the past few weeks, his niece had emailed prior to his visit reporting that he had anger outburst and even threatening behavior.  She did not provide any additional information during the visit.  She sent a MyChart message prior to the visit.  He denies any symptoms of anxiety or depression or mood irritability.  She did not report any recent mood irritability or decline in the mood.  In fact, she feels that she has steadily improved after his hospitalization in the past 2 weeks.  There is no telltale family history of depression.  He is 1 of a total of 10 siblings.  2 of his half brothers passed away.  He has 2 sisters that are full siblings and his other siblings are half siblings.  He is the third oldest.  He is divorced, he lives alone.  He is in the process of moving to [place] to move in with his Niece.  He retired some 14 years ago.  He worked for the [employer].  He quit smoking recently.  He does not drink alcohol on a regular basis and reports no issues with alcohol in the past.  He had 2 children, both are deceased.  His appetite has improved, he tries to hydrate well with water, estimates that he drinks about 4-6 bottles of water per day on average.  He does not  drink any caffeine on a daily basis.  He sleeps fairly well, reports that he uses a CPAP machine consistently.  He has sleep apnea and his CPAP is through the [place].    I reviewed your office note from 10/07/2020.  He was alert and oriented x3 at the time, nonfocal medical exam was noted.  He was not noticed to have any mood related issues at your office visit.  He did not have any labs at the time. He had multiple labs while in the hospital.  A1c on 09/18/2020 was 6.6, TSH normal, B12 on 09/25/2020 was 1272.  I reviewed his hospital records.  He was admitted on 09/17/2020 with hypotension.  He had been found down, was hypotensive and lethargic and he had had diarrhea.  In the emergency room, he was hypothermic and code sepsis was called.  Hospitalization was complicated by hypotension, acute kidney injury, hydronephrosis with need for stent placement on 09/18/2020.  He underwent EGD on 09/26/2020 which showed multiple gastric ulcers.  Hospital stay was complicated by anemia, and lower extremity DVT.  He underwent IVC filter placement on 09/30/2020.  He was found to have thrombocytopenia.  He was felt to have moderate protein calorie malnutrition.  Acute kidney injury resolved with hydration and hypokalemia improved with repletion.    He had a CT abd/pelvis wo contrast on 09/18/2020 and I reviewed  the results:  IMPRESSION: At least 3 obstructing right proximal ureteral calculi resulting in moderate right hydronephrosis. Calculi measure up to 13 mm and 15 mm in greatest dimension. Superimposed moderate bilateral nonobstructing nephrolithiasis. 2 mm punctate calculus within the bladder lumen.  Pancolonic moderate diverticulosis.  Aortic Atherosclerosis (ICD10-I70.0).  Septic shock was deemed secondary to E. coli sepsis.  He was treated with antibiotics.  He was discharged home with a walker and home health physical therapy.  His Past Medical History Is Significant For: Past Medical History:   Diagnosis Date  . Acute gastric ulcer with hemorrhage 07/30/2014  . Hypertension   . Sleep apnea     His Past Surgical History Is Significant For: Past Surgical History:  Procedure Laterality Date  . BIOPSY  09/26/2020   Procedure: BIOPSY;  Surgeon: Ladene Artist, MD;  Location: [facility];  Service: Gastroenterology;;  . COLONOSCOPY    . CYSTOSCOPY W/ URETERAL STENT PLACEMENT Right 09/18/2020   Procedure: CYSTOSCOPY WITH RETROGRADE PYELOGRAM/URETERAL STENT PLACEMENT;  Surgeon: Raynelle Bring, MD;  Location: [facility];  Service: Urology;  Laterality: Right;  . ESOPHAGOGASTRODUODENOSCOPY N/A 07/31/2014   Procedure: ESOPHAGOGASTRODUODENOSCOPY (EGD);  Surgeon: Gatha Mayer, MD;  Location: [facility] END[facility]COPY;  Service: Endoscopy;  Laterality: N/A;  . ESOPHAGOGASTRODUODENOSCOPY (EGD) WITH PROPOFOL N/A 09/26/2020   Procedure: ESOPHAGOGASTRODUODENOSCOPY (EGD) WITH PROPOFOL;  Surgeon: Ladene Artist, MD;  Location: [facility];  Service: Gastroenterology;  Laterality: N/A;  . ganglion cyst wrist  1971   right, x 2.  . HAMMER TOE SURGERY  1984   bilateral  . HEMOSTASIS CONTROL  09/26/2020   Procedure: HEMOSTASIS CONTROL;  Surgeon: Ladene Artist, MD;  Location: [facility];  Service: Gastroenterology;;  . IR IVC FILTER PLMT / S&I Burke Keels GUID/MOD SED  09/30/2020  . KNEE ARTHROSCOPY     left, 2004; right, 2001    His Family History Is Significant For: Family History  Problem Relation Age of Onset  . Hypertension Mother   . Colon cancer Neg Hx   . Stomach cancer Neg Hx   . Esophageal cancer Neg Hx   . Rectal cancer Neg Hx     His Social History Is Significant For: Social History   Socioeconomic History  . Marital status: Divorced    Spouse name: Not on file  . Number of children: Not on file  . Years of education: Not on file  . Highest education level: Not on file  Occupational History  . Occupation: retired  Tobacco Use  . Smoking status: Former Smoker    Start date:  10/05/2018  . Smokeless tobacco: Never Used  Substance and Sexual Activity  . Alcohol use: Not Currently    Comment: h/o heavy use; last drank 10/04/2018  . Drug use: No  . Sexual activity: Not on file  Other Topics Concern  . Not on file  Social History Narrative   Retired Set designer.     Moved to [place] ~ 2007.       Would drink 750 ml vodka over the course of 2 weeks.  Quit etoh and cigs September 2015.    Social Determinants of Health   Financial Resource Strain: Not on file  Food Insecurity: Not on file  Transportation Needs: Not on file  Physical Activity: Not on file  Stress: Not on file  Social Connections: Not on file    His Allergies Are:  Allergies  Allergen Reactions  . Furosemide     Muscle cramps  :  His Current Medications Are:  Outpatient Encounter Medications as of 10/15/2020  Medication Sig  . acetaminophen (TYLENOL) 325 MG tablet Take 975 mg by mouth daily as needed for mild pain.  . Coenzyme Q10 (CO Q 10) 100 MG CAPS Take 100 mg by mouth daily.  . folic acid (FOLVITE) 1 MG tablet Take 1 tablet (1 mg total) by mouth daily.  . Multiple Vitamin (MULTIVITAMIN) tablet Take 1 tablet by mouth daily.  . pantoprazole (PROTONIX) 40 MG tablet Take 1 tablet (40 mg total) by mouth 2 (two) times daily.  Marland Kitchen thiamine 100 MG tablet Take 1 tablet (100 mg total) by mouth daily.  Marland Kitchen tiZANidine (ZANAFLEX) 2 MG tablet Take 1 tablet by mouth 3 (three) times daily as needed for muscle spasms.   No facility-administered encounter medications on file as of 10/15/2020.  :   Review of Systems:  Out of a complete 14 point review of systems, all are reviewed and negative with the exception of these symptoms as listed below:    Review of Systems  Neurological:       Pt presents today to discuss his memory. Pt's family has noticed a decline in his cognitive function.    Objective:  Neurological Exam  Physical Exam Physical Examination:   Vitals:    10/15/20 1253  BP: 128/65  Pulse: 82   General Examination: The patient is a very pleasant 75 y.o. male in no acute distress. He appears well-developed and well-nourished and well groomed.   HEENT: Normocephalic, atraumatic, pupils are equal, round and reactive to light, corrective eyeglasses in place, extraocular tracking is well preserved, face is symmetric with normal facial animation, hearing grossly intact.  Speech is clear without dysarthria, hypophonia or voice tremor, no carotid bruits. Oropharynx exam reveals: moderate mouth dryness, adequate dental hygiene and moderate airway crowding. Tongue protrudes centrally and palate elevates symmetrically.   Chest: Clear to auscultation without wheezing, rhonchi or crackles noted.  Heart: S1+S2+0, regular and normal without murmurs, rubs or gallops noted.   Abdomen: Soft, non-tender and non-distended with normal bowel sounds appreciated on auscultation.  Extremities: There is 2+ pitting edema in the distal lower extremities bilaterally, primarily around the ankles.  Pedal pulses are difficult to palpate.    Skin: Warm and dry without trophic changes noted.  Musculoskeletal: exam reveals no obvious joint deformities, tenderness or joint swelling or erythema.   Neurologically:  Mental status: The patient is awake, alert and pays good attention. His immediate and remote memory, attention, language skills and fund of knowledge are mildly impaired. There is no evidence of aphasia, agnosia, apraxia or anomia. Speech is clear with normal prosody and enunciation. Thought process is linear. Mood is normal and affect is normal.   On 10/15/2020: MMSE: 24/30 (he missed 5 points on serial sevens and one-point on remote recall), CDT: 4/4, AFT: 20/min.   Cranial nerves II - XII are as described above under HEENT exam. In addition: shoulder shrug is normal with equal shoulder height noted. Motor exam: Normal bulk, strength and tone is noted. There is no  drift, tremor or rebound. Romberg is negative. Reflexes are 2+ throughout. Babinski: Toes are flexor bilaterally. Fine motor skills and coordination: intact with normal finger taps, normal hand movements, normal rapid alternating patting, normal foot taps and normal foot agility.  Cerebellar testing: No dysmetria or intention tremor on finger to nose testing. Heel to shin is unremarkable bilaterally. There is no truncal or gait ataxia.  Sensory exam: intact to light touch  in the upper and lower extremities.  Gait, station and balance: He stands without difficulty, posture is mildly stooped for age.  He walks slowly and cautiously.  He has preserved arm swing, no shuffling.  Assessment and plan:    In summary, Edward Wells is a very pleasant 75 y.o.-year old male with an underlying medical history of hypertension, sleep apnea, peptic ulcer disease, and recent hospitalization for severe sepsis with septic shock in December 2021, who presents for evaluation of his memory loss of at least several weeks duration.  He has been living alone, his niece moved to stay with him since he was hospitalized in December 2021 with severe sepsis and septic shock.  He was very ill and had a prolonged hospital course with several complications as outlined above.  He is doing better.  Memory scores are mildly abnormal.  He does not have a strong family history of dementia.  Memory decline may very well tie in with recent medical illness.  He has not had a brain scan, I would like to proceed with a brain MRI without contrast at this time.  We talked about the importance of healthy lifestyle, maintaining good nutrition, good hydration, staying active mentally and physically.  We talked about mood issues.  He denies any depression, anxiety, mood irritability.  His niece had emailed prior to the visit with concern for mood irritability and hostility but did not report any recent or acute problems during the visit.  They are advised  to follow-up with your office should there be any acute mood related issues; of note, there were no mood related issues noticed during your recent office visit with him.   I did not suggest any new medications from my end of things.  He is in the process of moving to [place] to stay with his niece long-term.  We will request expediting the brain MRI so he can get this done before they leave at the end of this month.  We will call Magda Paganini with the MRI results.   We will plan to see him in this clinic on an as-needed basis especially since he is moving soon.  I answered all the questions today and the patient and his niece were in agreement. Thank you very much for allowing me to participate in the care of this nice patient. If I can be of any further assistance to you please do not hesitate to call me at [phone].  Sincerely,   Star Age, MD, PhD

## 2020-10-15 NOTE — Patient Instructions (Addendum)
It was nice to meet you both today.  I am glad to hear that you are feeling better.   You have complaints of memory loss: memory loss or changes in cognitive function can have many reasons and does not always mean you have dementia. Conditions that can contribute to subjective or objective memory loss include: depression, stress, poor sleep from insomnia or sleep apnea, dehydration, fluctuation in blood sugar values, thyroid or electrolyte dysfunction and certain vitamin deficiencies. Dementia can be caused by stroke, brain atherosclerosis or brain vascular disease due to vascular risk factors (smoking, high blood pressure, high cholesterol, obesity and uncontrolled diabetes), certain degenerative brain disorders (including Parkinson's disease and Multiple sclerosis) and by Alzheimer's disease or other, more rare and sometimes hereditary causes. We will do some additional testing:  We will do a brain scan, called MRI and call you with the test results. We will have to schedule you for this on a separate date. This test requires authorization from your insurance, and we will take care of the insurance process. We will not start medication as yet, as your memory loss or cognitive decline may very well be connected to your recent acute illness for which you were hospitalized for a prolonged period of time. Please continue to work on healthy nutrition, good hydration with water, getting enough rest.  We will call your niece, Magda Paganini with your MRI results.  If you have any mood related issues such as symptoms of depression, anxiety or irritability, please contact your primary care physician as soon as possible.

## 2020-10-19 ENCOUNTER — Other Ambulatory Visit: Payer: Self-pay

## 2020-10-19 ENCOUNTER — Ambulatory Visit
Admission: RE | Admit: 2020-10-19 | Discharge: 2020-10-19 | Disposition: A | Discharge status: Home / Self Care | Payer: Medicare Other | Source: Ambulatory Visit | Attending: Neurology | Admitting: Neurology

## 2020-10-19 DIAGNOSIS — R4189 Other symptoms and signs involving cognitive functions and awareness: Secondary | ICD-10-CM

## 2020-10-19 DIAGNOSIS — Z9289 Personal history of other medical treatment: Secondary | ICD-10-CM

## 2020-10-19 DIAGNOSIS — Z8619 Personal history of other infectious and parasitic diseases: Secondary | ICD-10-CM

## 2020-10-19 DIAGNOSIS — R413 Other amnesia: Secondary | ICD-10-CM

## 2020-10-22 ENCOUNTER — Telehealth: Payer: Self-pay

## 2020-10-22 NOTE — Telephone Encounter (Signed)
-----   Message from Star Age, MD sent at 10/22/2020  9:31 AM EST ----- Please call patient's niece who should be on DPR that patient's brain MRI without contrast showed no acute findings. He did have mild volume loss which we call atrophy and this could be quite typical for age.  He had mild changes in the deeper structures of the brain which we call white matter changes, again not atypical for age.  This can be secondary to high blood pressure, aging, chronic migraines, to name a few causes.  He also had evidence of chronic sinusitis.  No acute findings were seen, no evidence of stroke or old stroke.  If he has chronic sinus drainage, congestion or recurrent sinusitis, it would be worthwhile seeing ENT (Ear/Nose/Throat) specialist.

## 2020-10-22 NOTE — Telephone Encounter (Signed)
I called patient.  I spoke with patient's niece, Magda Paganini, per DPR.  I discussed patient's MRI results and recommendations.  Patient's niece denies chronic sinus issues in patient.  Patient's niece verbalized understanding of patient's MRI results and had no further questions at this time.

## 2020-10-22 NOTE — Progress Notes (Signed)
Please call patient's niece who should be on DPR that patient's brain MRI without contrast showed no acute findings. He did have mild volume loss which we call atrophy and this could be quite typical for age.  He had mild changes in the deeper structures of the brain which we call white matter changes, again not atypical for age.  This can be secondary to high blood pressure, aging, chronic migraines, to name a few causes.  He also had evidence of chronic sinusitis.  No acute findings were seen, no evidence of stroke or old stroke.  If he has chronic sinus drainage, congestion or recurrent sinusitis, it would be worthwhile seeing ENT (Ear/Nose/Throat) specialist.

## 2020-11-04 DIAGNOSIS — I82491 Acute embolism and thrombosis of other specified deep vein of right lower extremity: Secondary | ICD-10-CM | POA: Diagnosis not present

## 2020-11-04 DIAGNOSIS — L600 Ingrowing nail: Secondary | ICD-10-CM | POA: Diagnosis not present

## 2020-11-04 DIAGNOSIS — M7989 Other specified soft tissue disorders: Secondary | ICD-10-CM | POA: Diagnosis not present

## 2020-11-04 DIAGNOSIS — M62838 Other muscle spasm: Secondary | ICD-10-CM | POA: Diagnosis not present

## 2020-11-04 DIAGNOSIS — E782 Mixed hyperlipidemia: Secondary | ICD-10-CM | POA: Diagnosis not present

## 2020-11-04 DIAGNOSIS — K219 Gastro-esophageal reflux disease without esophagitis: Secondary | ICD-10-CM | POA: Diagnosis not present

## 2020-11-04 DIAGNOSIS — D508 Other iron deficiency anemias: Secondary | ICD-10-CM | POA: Diagnosis not present

## 2020-11-04 DIAGNOSIS — E1165 Type 2 diabetes mellitus with hyperglycemia: Secondary | ICD-10-CM | POA: Diagnosis not present

## 2020-11-04 DIAGNOSIS — I10 Essential (primary) hypertension: Secondary | ICD-10-CM | POA: Diagnosis not present

## 2020-11-04 DIAGNOSIS — N201 Calculus of ureter: Secondary | ICD-10-CM | POA: Diagnosis not present

## 2020-11-04 DIAGNOSIS — B351 Tinea unguium: Secondary | ICD-10-CM | POA: Diagnosis not present

## 2020-11-04 DIAGNOSIS — Z125 Encounter for screening for malignant neoplasm of prostate: Secondary | ICD-10-CM | POA: Diagnosis not present

## 2020-11-05 DIAGNOSIS — R600 Localized edema: Secondary | ICD-10-CM | POA: Diagnosis not present

## 2020-11-11 DIAGNOSIS — K259 Gastric ulcer, unspecified as acute or chronic, without hemorrhage or perforation: Secondary | ICD-10-CM | POA: Diagnosis not present

## 2020-11-11 DIAGNOSIS — Z86010 Personal history of colonic polyps: Secondary | ICD-10-CM | POA: Diagnosis not present

## 2020-11-27 ENCOUNTER — Ambulatory Visit: Payer: Medicare Other | Admitting: Gastroenterology

## 2021-05-26 ENCOUNTER — Encounter: Payer: Self-pay | Admitting: Gastroenterology

## 2022-11-18 IMAGING — DX DG CHEST 1V PORT
1 series · 1 of 1 positions shown · non-contrast
Comparison: 02/16/2019

CLINICAL DATA: Possible sepsis

EXAM:
PORTABLE CHEST 1 VIEW

[chest ap]
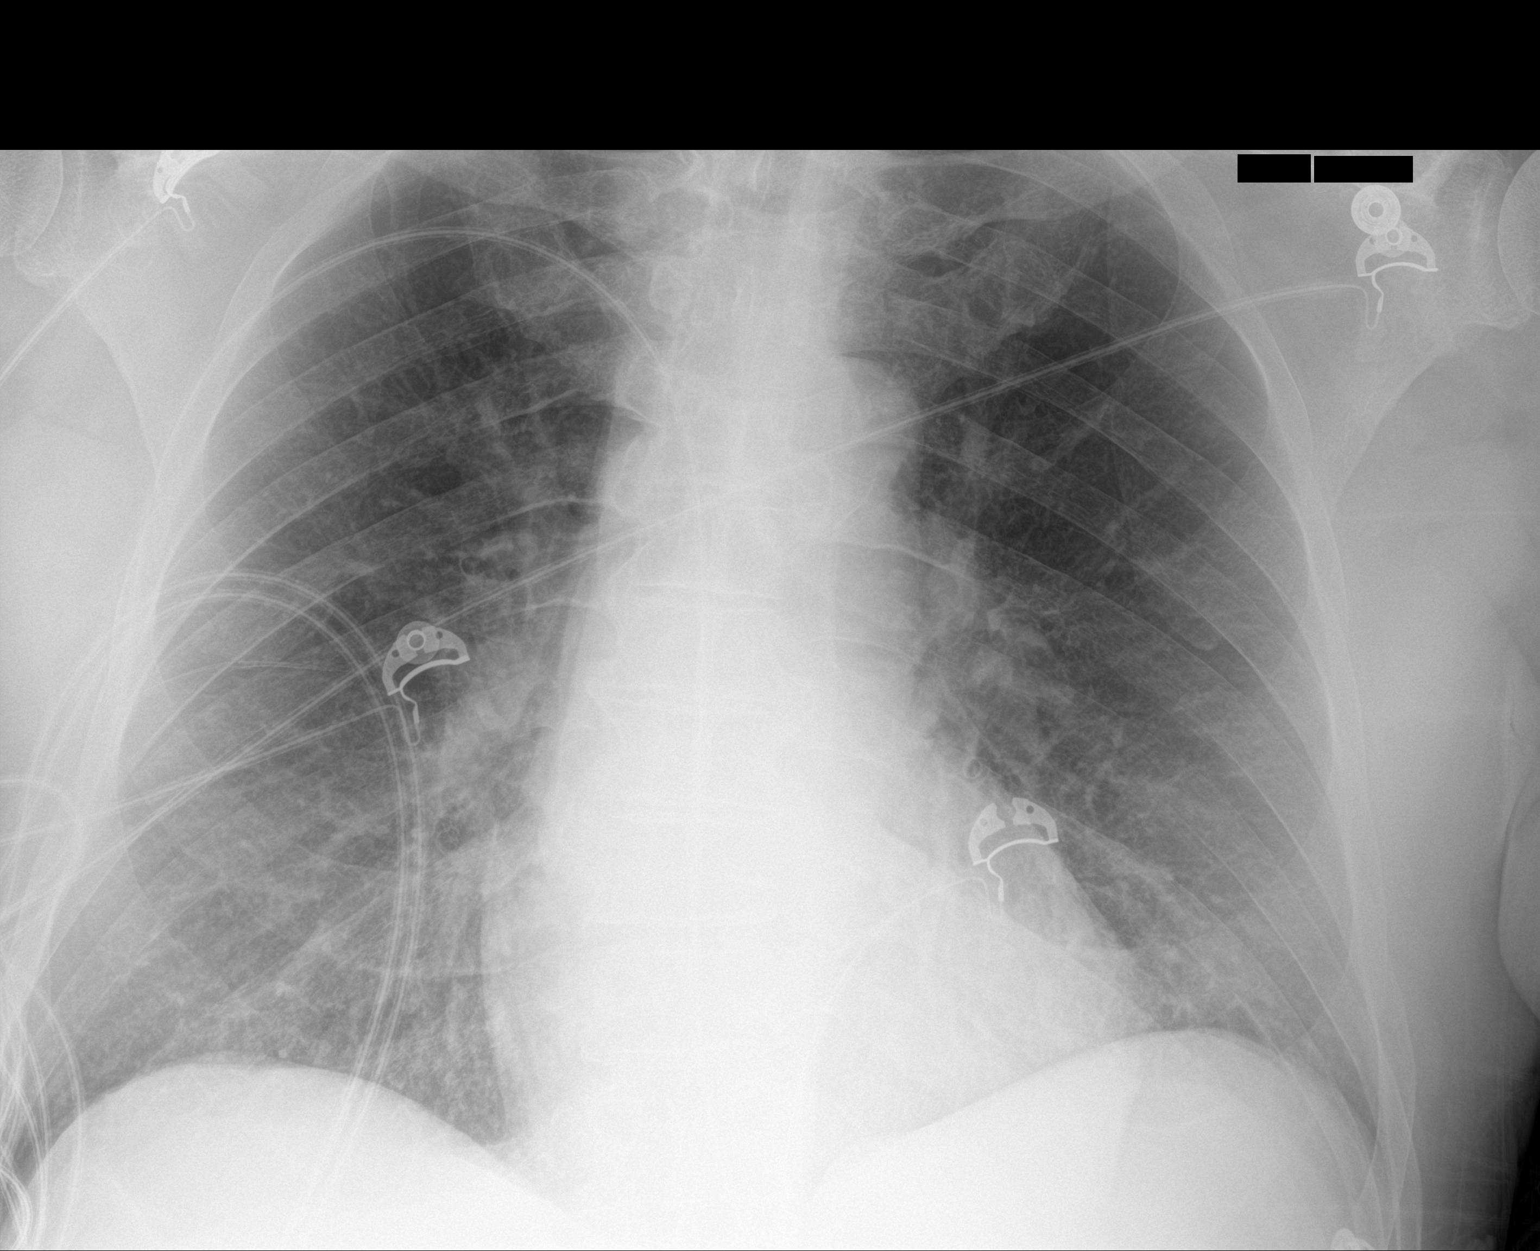

[1 of 1 positions shown; findings below may reference images not displayed]

FINDINGS: The heart size and mediastinal contours are within normal limits.
Both lungs are clear. The visualized skeletal structures are
unremarkable.
IMPRESSION: No active disease.
